# Patient Record
Sex: Male | Born: 1955 | Race: Black or African American | Hispanic: No | Marital: Single | Smoking: Current every day smoker
Health system: Southern US, Community
[De-identification: ages and names within clinical notes are randomized; demographics above are authoritative.]

## PROBLEM LIST (undated history)

## (undated) DIAGNOSIS — B20 Human immunodeficiency virus [HIV] disease: Secondary | ICD-10-CM

## (undated) DIAGNOSIS — F419 Anxiety disorder, unspecified: Secondary | ICD-10-CM

## (undated) DIAGNOSIS — F32A Depression, unspecified: Secondary | ICD-10-CM

## (undated) DIAGNOSIS — F329 Major depressive disorder, single episode, unspecified: Secondary | ICD-10-CM

## (undated) HISTORY — PX: KNEE SURGERY: SHX244

## (undated) HISTORY — PX: OTHER SURGICAL HISTORY: SHX169

---

## 2005-04-21 ENCOUNTER — Emergency Department (HOSPITAL_COMMUNITY): Admission: EM | Admit: 2005-04-21 | Discharge: 2005-04-21 | Payer: Self-pay | Admitting: Emergency Medicine

## 2005-06-30 ENCOUNTER — Emergency Department (HOSPITAL_COMMUNITY): Admission: EM | Admit: 2005-06-30 | Discharge: 2005-06-30 | Payer: Self-pay | Admitting: Emergency Medicine

## 2007-04-18 ENCOUNTER — Emergency Department (HOSPITAL_COMMUNITY): Admission: EM | Admit: 2007-04-18 | Discharge: 2007-04-18 | Payer: Self-pay | Admitting: Emergency Medicine

## 2008-05-20 ENCOUNTER — Emergency Department (HOSPITAL_COMMUNITY): Admission: EM | Admit: 2008-05-20 | Discharge: 2008-05-21 | Payer: Self-pay | Admitting: Emergency Medicine

## 2008-05-21 ENCOUNTER — Emergency Department (HOSPITAL_COMMUNITY): Admission: EM | Admit: 2008-05-21 | Discharge: 2008-05-21 | Payer: Self-pay | Admitting: Emergency Medicine

## 2010-06-17 LAB — CBC
HCT: 33 % — ABNORMAL LOW (ref 39.0–52.0)
Hemoglobin: 11.5 g/dL — ABNORMAL LOW (ref 13.0–17.0)
MCHC: 34.8 g/dL (ref 30.0–36.0)
MCV: 94.5 fL (ref 78.0–100.0)
Platelets: 251 10*3/uL (ref 150–400)
RDW: 13.4 % (ref 11.5–15.5)

## 2010-06-17 LAB — BASIC METABOLIC PANEL
BUN: 9 mg/dL (ref 6–23)
CO2: 28 mEq/L (ref 19–32)
Creatinine, Ser: 1.04 mg/dL (ref 0.4–1.5)
GFR calc non Af Amer: >60 mL/min (ref >60)
Glucose, Bld: 82 mg/dL (ref 70–99)
Sodium: 133 mEq/L — ABNORMAL LOW (ref 135–145)

## 2010-06-17 LAB — POCT I-STAT, CHEM 8
BUN: 14 mg/dL (ref 6–23)
HCT: 36 % — ABNORMAL LOW (ref 39.0–52.0)
Sodium: 142 mEq/L (ref 135–145)
TCO2: 28 mmol/L (ref 0–100)

## 2010-11-26 LAB — DIFFERENTIAL
Basophils Absolute: 0
Eosinophils Relative: 0
Lymphocytes Relative: 32
Monocytes Relative: 7
Neutrophils Relative %: 60

## 2010-11-26 LAB — URINALYSIS, ROUTINE W REFLEX MICROSCOPIC
Glucose, UA: NEGATIVE
Ketones, ur: 15 — AB
Specific Gravity, Urine: 1.029

## 2010-11-26 LAB — CBC
HCT: 34.4 — ABNORMAL LOW
MCHC: 34.2
MCV: 94
RDW: 12.8
WBC: 3.8 — ABNORMAL LOW

## 2010-11-26 LAB — I-STAT 8, (EC8 V) (CONVERTED LAB)
Chloride: 105
HCT: 39
Operator id: 198171
TCO2: 25
pCO2, Ven: 45.7

## 2010-11-26 LAB — URINE MICROSCOPIC-ADD ON

## 2010-11-26 LAB — POCT I-STAT CREATININE: Operator id: 198171

## 2011-02-02 ENCOUNTER — Emergency Department (HOSPITAL_COMMUNITY): Payer: Self-pay

## 2011-02-02 ENCOUNTER — Emergency Department (HOSPITAL_COMMUNITY)
Admission: EM | Admit: 2011-02-02 | Discharge: 2011-02-02 | Disposition: A | Discharge status: Home / Self Care | Payer: Self-pay | Attending: Emergency Medicine | Admitting: Emergency Medicine

## 2011-02-02 ENCOUNTER — Encounter: Payer: Self-pay | Admitting: *Deleted

## 2011-02-02 DIAGNOSIS — K5900 Constipation, unspecified: Secondary | ICD-10-CM | POA: Insufficient documentation

## 2011-02-02 DIAGNOSIS — R1013 Epigastric pain: Secondary | ICD-10-CM | POA: Insufficient documentation

## 2011-02-02 DIAGNOSIS — R110 Nausea: Secondary | ICD-10-CM | POA: Insufficient documentation

## 2011-02-02 DIAGNOSIS — M546 Pain in thoracic spine: Secondary | ICD-10-CM | POA: Insufficient documentation

## 2011-02-02 LAB — LIPASE, BLOOD: Lipase: 23 U/L (ref 11–59)

## 2011-02-02 LAB — DIFFERENTIAL
Basophils Relative: 1 % (ref 0–1)
Eosinophils Relative: 5 % (ref 0–5)
Lymphs Abs: 0.9 10*3/uL (ref 0.7–4.0)
Monocytes Absolute: 0.3 10*3/uL (ref 0.1–1.0)

## 2011-02-02 LAB — RAPID URINE DRUG SCREEN, HOSP PERFORMED
Amphetamines: NOT DETECTED
Barbiturates: NOT DETECTED
Benzodiazepines: NOT DETECTED

## 2011-02-02 LAB — CBC
HCT: 32.1 % — ABNORMAL LOW (ref 39.0–52.0)
MCH: 32.1 pg (ref 26.0–34.0)
MCV: 95.5 fL (ref 78.0–100.0)
RDW: 13.1 % (ref 11.5–15.5)
WBC: 4.3 10*3/uL (ref 4.0–10.5)

## 2011-02-02 LAB — URINALYSIS, ROUTINE W REFLEX MICROSCOPIC
Ketones, ur: NEGATIVE mg/dL
Leukocytes, UA: NEGATIVE
Protein, ur: NEGATIVE mg/dL
Urobilinogen, UA: 1 mg/dL (ref 0.0–1.0)
pH: 6.5 (ref 5.0–8.0)

## 2011-02-02 LAB — URINE MICROSCOPIC-ADD ON

## 2011-02-02 LAB — COMPREHENSIVE METABOLIC PANEL
ALT: 33 U/L (ref 0–53)
Alkaline Phosphatase: 71 U/L (ref 39–117)
CO2: 28 mEq/L (ref 19–32)
Creatinine, Ser: 1.03 mg/dL (ref 0.50–1.35)
GFR calc non Af Amer: 80 mL/min — ABNORMAL LOW (ref >90)
Sodium: 134 mEq/L — ABNORMAL LOW (ref 135–145)

## 2011-02-02 LAB — ETHANOL: Alcohol, Ethyl (B): <11 mg/dL (ref 0–11)

## 2011-02-02 MED ORDER — PANTOPRAZOLE SODIUM 40 MG IV SOLR
40.0000 mg | Freq: Once | INTRAVENOUS | Status: AC
  Administered 2011-02-02: 40 mg via INTRAVENOUS
  Filled 2011-02-02: qty 40

## 2011-02-02 MED ORDER — FAMOTIDINE 20 MG PO TABS
ORAL_TABLET | ORAL | Status: AC
  Administered 2011-02-02: 20 mg via ORAL
  Filled 2011-02-02: qty 1

## 2011-02-02 MED ORDER — FAMOTIDINE IN NACL 20-0.9 MG/50ML-% IV SOLN: 20.0000 mg | Freq: Once | INTRAVENOUS | Status: DC

## 2011-02-02 MED ORDER — FAMOTIDINE 20 MG PO TABS: 20.0000 mg | ORAL_TABLET | Freq: Two times a day (BID) | ORAL | Status: DC

## 2011-02-02 MED ORDER — SODIUM CHLORIDE 0.9 % IV SOLN: INTRAVENOUS | Status: DC

## 2011-02-02 MED ORDER — ONDANSETRON HCL 4 MG/2ML IJ SOLN
4.0000 mg | Freq: Once | INTRAMUSCULAR | Status: AC
  Administered 2011-02-02: 4 mg via INTRAVENOUS
  Filled 2011-02-02: qty 2

## 2011-02-02 MED ORDER — SODIUM CHLORIDE 0.9 % IV BOLUS (SEPSIS)
500.0000 mL | Freq: Once | INTRAVENOUS | Status: AC
  Administered 2011-02-02: 1000 mL via INTRAVENOUS

## 2011-02-02 MED ORDER — POLYETHYLENE GLYCOL 3350 17 GM/SCOOP PO POWD: 17.0000 g | Freq: Two times a day (BID) | ORAL | Status: AC

## 2011-02-02 MED ORDER — FAMOTIDINE 20 MG PO TABS
20.0000 mg | ORAL_TABLET | Freq: Once | ORAL | Status: AC
  Administered 2011-02-02: 20 mg via ORAL

## 2011-02-02 NOTE — ED Notes (Signed)
C/o pain in lower back and abd since Monday. Pt ambulatory, no deformity or bruising noted. Pain worse with mvt. Denies n/v/d. Pt in no acute distress, a&ox3, nuero intact.

## 2011-02-02 NOTE — ED Provider Notes (Signed)
Medical screening examination/treatment/procedure(s) were conducted as a shared visit with non-physician practitioner(s) and myself.  I personally evaluated the patient during the encounter  Flint Melter, MD 02/02/11 2052

## 2011-02-02 NOTE — ED Provider Notes (Signed)
History     CSN: [id number] Arrival date & time: 02/02/2011  6:39 AM   First MD Initiated Contact with Patient 02/02/11 [phone]      Chief Complaint  Patient presents with  . Abdominal Pain    pinpoints to L axillary /back pain, radiates into L & mid upper abd    (Consider location/radiation/quality/duration/timing/severity/associated sxs/prior treatment) Patient is a 55 y.o. male presenting with abdominal pain. The history is provided by the patient.  Abdominal Pain The primary symptoms of the illness include abdominal pain and nausea. The primary symptoms of the illness do not include fever, fatigue, shortness of breath, vomiting, diarrhea, hematemesis, hematochezia or dysuria. The current episode started more than 2 days ago. The onset of the illness was gradual.  The abdominal pain is located in the epigastric region. The abdominal pain radiates to the back. The severity of the abdominal pain is 5/10. The abdominal pain is relieved by nothing. Exacerbated by: being supine.  Associated with: no recent BM. Significant associated medical issues do not include PUD, GERD, inflammatory bowel disease or diabetes.   He has not sustained any trauma. The pain waxes and wanes. The discomfort makes it difficult to work and to sleep.  History reviewed. No pertinent past medical history.  Past Surgical History  Procedure Date  . Knee surgery     Family History  Problem Relation Age of Onset  . Diabetes Mother     History  Substance Use Topics  . Smoking status: Current Everyday Smoker  . Smokeless tobacco: Not on file  . Alcohol Use: Yes     occassional      Review of Systems  Constitutional: Negative for fever and fatigue.  Respiratory: Negative for shortness of breath.   Gastrointestinal: Positive for nausea and abdominal pain. Negative for vomiting, diarrhea, hematochezia and hematemesis.  Genitourinary: Negative for dysuria.  All other systems reviewed and are  negative.    Allergies  Review of patient's allergies indicates no known allergies.  Home Medications   Current Outpatient Rx  Name Route Sig Dispense Refill  . LORATADINE 10 MG PO TABS Oral Take 10 mg by mouth daily.        BP 118/69  Pulse 64  Temp(Src) 97.7 F (36.5 C) (Oral)  Resp 12  SpO2 100%  Physical Exam  Constitutional: He is oriented to person, place, and time. He appears well-developed and well-nourished.  HENT:  Head: Normocephalic and atraumatic.  Eyes: Conjunctivae and EOM are normal. Pupils are equal, round, and reactive to light.  Neck: Normal range of motion. Neck supple.  Cardiovascular: Normal rate.   Pulmonary/Chest: Effort normal and breath sounds normal.  Abdominal: Soft. Bowel sounds are normal. He exhibits no distension and no mass. There is tenderness (Epigastric). There is no rebound and no guarding.  Musculoskeletal: Normal range of motion.  Neurological: He is alert and oriented to person, place, and time. No cranial nerve deficit. He exhibits normal muscle tone. Coordination normal.  Skin: Skin is warm and dry.  Psychiatric: He has a normal mood and affect. His behavior is normal. Thought content normal.    ED Course  Procedures (including critical care time)  Date: 02/02/2011  Rate:55  Rhythm: normal sinus rhythm  QRS Axis: normal  Intervals: normal  ST/T Wave abnormalities: normal  Conduction Disutrbances:none  Narrative Interpretation:   Old EKG Reviewed: unchanged  Labs Reviewed  COMPREHENSIVE METABOLIC PANEL - Abnormal; Notable for the following:    Sodium 134 (*)  Total Protein 10.4 (*)    Albumin 3.0 (*)    AST 42 (*)    Total Bilirubin 0.2 (*)    GFR calc non Af Amer 80 (*)    All other components within normal limits  CBC - Abnormal; Notable for the following:    RBC 3.36 (*)    Hemoglobin 10.8 (*)    HCT 32.1 (*)    All other components within normal limits  URINALYSIS, ROUTINE W REFLEX MICROSCOPIC - Abnormal;  Notable for the following:    Hgb urine dipstick MODERATE (*)    All other components within normal limits  URINE MICROSCOPIC-ADD ON - Abnormal; Notable for the following:    Casts WBC CAST (*)    All other components within normal limits  LIPASE, BLOOD  DIFFERENTIAL  ETHANOL  URINE RAPID DRUG SCREEN (HOSP PERFORMED)   Dg Abd Acute W/chest  02/02/2011  *RADIOLOGY REPORT*  Clinical Data: Mid upper abdominal pain, back pain  ACUTE ABDOMEN SERIES (ABDOMEN 2 VIEW & CHEST 1 VIEW)  Comparison: None  Findings: Normal heart size, mediastinal contours, and pulmonary vascularity. Minimal bronchitic changes without infiltrate or effusion. No pneumothorax. Prominent stool in right colon. Nonobstructive bowel gas pattern. No bowel dilatation, bowel wall thickening or free intraperitoneal air. Osseous demineralization. No urinary tract calcification.  IMPRESSION: No acute abnormalities. Minimal bronchitic changes. Prominent stool in right colon.  Original Report Authenticated By: Lollie Marrow, M.D.     No diagnosis found.    MDM  Nonspecific abdominal pain. Doubt acute coronary syndrome, pneumonia, colitis or pancreatitis. Patient likely has GERD. He is stable for discharge.      Medical screening examination/treatment/procedure(s) were conducted as a shared visit with non-physician practitioner(s) and myself.  I personally evaluated the patient during the encounter. The patient was dispositioned from the ED via the CDU by the practitioner there.  Flint Melter, MD 02/02/11 2052

## 2011-02-02 NOTE — ED Provider Notes (Signed)
Patient seen and evaluated.  VSS reviewed. . Nursing notes reviewed. Discussed with attending physician, Dr. Effie Shy. No imaging needed at this time. NO lab abnormalities. Constipation on x-ray. Initial testing ordered. Will monitor the patient closely. They agree with the treatment plan and diagnosis.   11:34 AM abdomen soft. Minimal tenderness to palpation in epigastric region. No associated SOB or chest pain. No nausea or vomiting. Reports that he has been constipation. No rectal bleeding. Discussed warning signs for patient to return, stated agreement and understanding. No cardiac history. Discussed with Dr. Effie Shy. Patient had an EKG in the ED that was normal. No STEMI. No further cardiac workup is indicated at this time.   Nathan Charity, PA 02/02/11 1145

## 2011-02-02 NOTE — ED Notes (Signed)
Pt states that he has been having mid to lower back pain since Monday and now it has began to wrap around to his abdomen and chest.  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  States that pain has been increasing and it has become difficult to move around and lay down.

## 2011-02-02 NOTE — ED Notes (Addendum)
C/o L axillary  Back pain, radiates into mid & L upper abd, no relief with ibuprofen, tylenol nor senna. Though it might be gas. Worse with deep breath, walking  or movement, better when sitting. "has been burping a lot". (Denies: nvd or fever), last BM 2d ago, last ate yesterday, had coffee this am.

## 2011-02-04 ENCOUNTER — Emergency Department (HOSPITAL_COMMUNITY): Payer: Self-pay

## 2011-02-04 ENCOUNTER — Encounter (HOSPITAL_COMMUNITY): Payer: Self-pay | Admitting: Emergency Medicine

## 2011-02-04 ENCOUNTER — Inpatient Hospital Stay (HOSPITAL_COMMUNITY)
Admission: EM | Admit: 2011-02-04 | Discharge: 2011-02-10 | DRG: 424 | Disposition: A | Discharge status: Home / Self Care | Payer: Self-pay | Attending: Internal Medicine | Admitting: Internal Medicine

## 2011-02-04 DIAGNOSIS — Z21 Asymptomatic human immunodeficiency virus [HIV] infection status: Secondary | ICD-10-CM | POA: Diagnosis present

## 2011-02-04 DIAGNOSIS — E871 Hypo-osmolality and hyponatremia: Secondary | ICD-10-CM | POA: Diagnosis present

## 2011-02-04 DIAGNOSIS — K5900 Constipation, unspecified: Secondary | ICD-10-CM | POA: Diagnosis present

## 2011-02-04 DIAGNOSIS — K859 Acute pancreatitis without necrosis or infection, unspecified: Principal | ICD-10-CM | POA: Diagnosis present

## 2011-02-04 DIAGNOSIS — D649 Anemia, unspecified: Secondary | ICD-10-CM | POA: Diagnosis present

## 2011-02-04 DIAGNOSIS — R1013 Epigastric pain: Secondary | ICD-10-CM | POA: Diagnosis present

## 2011-02-04 DIAGNOSIS — E8809 Other disorders of plasma-protein metabolism, not elsewhere classified: Secondary | ICD-10-CM | POA: Diagnosis present

## 2011-02-04 DIAGNOSIS — R809 Proteinuria, unspecified: Secondary | ICD-10-CM | POA: Diagnosis present

## 2011-02-04 DIAGNOSIS — R591 Generalized enlarged lymph nodes: Secondary | ICD-10-CM | POA: Diagnosis present

## 2011-02-04 DIAGNOSIS — R21 Rash and other nonspecific skin eruption: Secondary | ICD-10-CM | POA: Diagnosis present

## 2011-02-04 DIAGNOSIS — B029 Zoster without complications: Secondary | ICD-10-CM | POA: Diagnosis present

## 2011-02-04 DIAGNOSIS — E049 Nontoxic goiter, unspecified: Secondary | ICD-10-CM | POA: Diagnosis present

## 2011-02-04 DIAGNOSIS — F17200 Nicotine dependence, unspecified, uncomplicated: Secondary | ICD-10-CM | POA: Diagnosis present

## 2011-02-04 DIAGNOSIS — B20 Human immunodeficiency virus [HIV] disease: Secondary | ICD-10-CM | POA: Diagnosis present

## 2011-02-04 DIAGNOSIS — R599 Enlarged lymph nodes, unspecified: Secondary | ICD-10-CM | POA: Diagnosis present

## 2011-02-04 DIAGNOSIS — E876 Hypokalemia: Secondary | ICD-10-CM | POA: Diagnosis present

## 2011-02-04 LAB — CBC
HCT: 32.6 % — ABNORMAL LOW (ref 39.0–52.0)
HCT: 31.4 % — ABNORMAL LOW (ref 39.0–52.0)
MCH: 32.5 pg (ref 26.0–34.0)
MCHC: 34.4 g/dL (ref 30.0–36.0)
MCV: 92.9 fL (ref 78.0–100.0)
MCV: 93.2 fL (ref 78.0–100.0)
Platelets: 208 10*3/uL (ref 150–400)
Platelets: 203 10*3/uL (ref 150–400)
RBC: 3.51 MIL/uL — ABNORMAL LOW (ref 4.22–5.81)
RDW: 13.3 % (ref 11.5–15.5)
WBC: 5.8 10*3/uL (ref 4.0–10.5)
WBC: 6.2 10*3/uL (ref 4.0–10.5)

## 2011-02-04 LAB — DIFFERENTIAL
Eosinophils Absolute: 0 10*3/uL (ref 0.0–0.7)
Eosinophils Relative: 0 % (ref 0–5)
Lymphocytes Relative: 14 % (ref 12–46)
Lymphs Abs: 0.8 10*3/uL (ref 0.7–4.0)
Monocytes Absolute: 0.4 10*3/uL (ref 0.1–1.0)
Monocytes Relative: 7 % (ref 3–12)

## 2011-02-04 LAB — URINALYSIS, ROUTINE W REFLEX MICROSCOPIC
Nitrite: NEGATIVE
Protein, ur: 100 mg/dL — AB
Specific Gravity, Urine: 1.019 (ref 1.005–1.030)
Urobilinogen, UA: 0.2 mg/dL (ref 0.0–1.0)

## 2011-02-04 LAB — POCT I-STAT TROPONIN I

## 2011-02-04 LAB — IRON AND TIBC
Iron: 22 ug/dL — ABNORMAL LOW (ref 42–135)
TIBC: 223 ug/dL (ref 215–435)
UIBC: 201 ug/dL (ref 125–400)

## 2011-02-04 LAB — COMPREHENSIVE METABOLIC PANEL
ALT: 99 U/L — ABNORMAL HIGH (ref 0–53)
BUN: 16 mg/dL (ref 6–23)
CO2: 24 mEq/L (ref 19–32)
Calcium: 8.8 mg/dL (ref 8.4–10.5)
Creatinine, Ser: 1.02 mg/dL (ref 0.50–1.35)
GFR calc Af Amer: >90 mL/min (ref >90)
GFR calc non Af Amer: 81 mL/min — ABNORMAL LOW (ref >90)
Glucose, Bld: 97 mg/dL (ref 70–99)
Total Protein: 11.4 g/dL — ABNORMAL HIGH (ref 6.0–8.3)

## 2011-02-04 LAB — VITAMIN B12: Vitamin B-12: 492 pg/mL (ref 211–911)

## 2011-02-04 LAB — LIPASE, BLOOD: Lipase: 83 U/L — ABNORMAL HIGH (ref 11–59)

## 2011-02-04 LAB — FERRITIN: Ferritin: 504 ng/mL — ABNORMAL HIGH (ref 22–322)

## 2011-02-04 LAB — HEPATITIS B SURFACE ANTIGEN: Hepatitis B Surface Ag: NEGATIVE

## 2011-02-04 LAB — URINE MICROSCOPIC-ADD ON

## 2011-02-04 LAB — RETICULOCYTES: Retic Count, Absolute: 33.7 10*3/uL (ref 19.0–186.0)

## 2011-02-04 LAB — SEDIMENTATION RATE: Sed Rate: 124 mm/hr — ABNORMAL HIGH (ref 0–16)

## 2011-02-04 LAB — CREATININE, SERUM: GFR calc Af Amer: >90 mL/min (ref >90)

## 2011-02-04 MED ORDER — TUBERCULIN PPD 5 UNIT/0.1ML ID SOLN
5.0000 [IU] | Freq: Once | INTRADERMAL | Status: AC
  Administered 2011-02-04: 5 [IU] via INTRADERMAL
  Filled 2011-02-04: qty 0.1

## 2011-02-04 MED ORDER — MORPHINE SULFATE 4 MG/ML IJ SOLN
4.0000 mg | Freq: Once | INTRAMUSCULAR | Status: AC
  Administered 2011-02-04: 4 mg via INTRAVENOUS
  Filled 2011-02-04: qty 1

## 2011-02-04 MED ORDER — DOCUSATE SODIUM 100 MG PO CAPS
100.0000 mg | ORAL_CAPSULE | Freq: Two times a day (BID) | ORAL | Status: DC
  Administered 2011-02-04 – 2011-02-10 (×12): 100 mg via ORAL
  Filled 2011-02-04 (×13): qty 1

## 2011-02-04 MED ORDER — HYDROXYZINE HCL 25 MG PO TABS
25.0000 mg | ORAL_TABLET | Freq: Three times a day (TID) | ORAL | Status: DC | PRN
  Filled 2011-02-04 (×3): qty 1

## 2011-02-04 MED ORDER — ALUM & MAG HYDROXIDE-SIMETH 200-200-20 MG/5ML PO SUSP
30.0000 mL | Freq: Four times a day (QID) | ORAL | Status: DC | PRN
  Administered 2011-02-06: 30 mL via ORAL
  Filled 2011-02-04 (×2): qty 30

## 2011-02-04 MED ORDER — SODIUM CHLORIDE 0.9 % IV BOLUS (SEPSIS)
1000.0000 mL | Freq: Once | INTRAVENOUS | Status: AC
  Administered 2011-02-04: 1000 mL via INTRAVENOUS

## 2011-02-04 MED ORDER — ACETAMINOPHEN 650 MG RE SUPP: 650.0000 mg | Freq: Four times a day (QID) | RECTAL | Status: DC | PRN

## 2011-02-04 MED ORDER — SODIUM CHLORIDE 0.9 % IJ SOLN
3.0000 mL | INTRAMUSCULAR | Status: DC | PRN
  Administered 2011-02-07: 3 mL via INTRAVENOUS

## 2011-02-04 MED ORDER — IOHEXOL 300 MG/ML  SOLN
100.0000 mL | Freq: Once | INTRAMUSCULAR | Status: AC | PRN
  Administered 2011-02-04: 100 mL via INTRAVENOUS

## 2011-02-04 MED ORDER — IOHEXOL 300 MG/ML  SOLN
80.0000 mL | Freq: Once | INTRAMUSCULAR | Status: AC | PRN
  Administered 2011-02-04: 80 mL via INTRAVENOUS

## 2011-02-04 MED ORDER — PROMETHAZINE HCL 25 MG/ML IJ SOLN: 12.5000 mg | Freq: Four times a day (QID) | INTRAMUSCULAR | Status: DC | PRN

## 2011-02-04 MED ORDER — SODIUM CHLORIDE 0.9 % IV SOLN
INTRAVENOUS | Status: DC
  Administered 2011-02-04 (×2): via INTRAVENOUS
  Administered 2011-02-05: 100 mL via INTRAVENOUS

## 2011-02-04 MED ORDER — SODIUM CHLORIDE 0.9 % IV SOLN: 250.0000 mL | INTRAVENOUS | Status: DC | PRN

## 2011-02-04 MED ORDER — ENOXAPARIN SODIUM 40 MG/0.4ML ~~LOC~~ SOLN
40.0000 mg | Freq: Every day | SUBCUTANEOUS | Status: DC
  Administered 2011-02-05 – 2011-02-06 (×2): 40 mg via SUBCUTANEOUS
  Filled 2011-02-04 (×3): qty 0.4

## 2011-02-04 MED ORDER — OXYCODONE HCL 5 MG PO TABS
5.0000 mg | ORAL_TABLET | ORAL | Status: DC | PRN
  Administered 2011-02-05 – 2011-02-10 (×16): 5 mg via ORAL
  Filled 2011-02-04 (×16): qty 1

## 2011-02-04 MED ORDER — ACETAMINOPHEN 325 MG PO TABS: 650.0000 mg | ORAL_TABLET | Freq: Four times a day (QID) | ORAL | Status: DC | PRN

## 2011-02-04 MED ORDER — OXYCODONE-ACETAMINOPHEN 5-325 MG PO TABS
1.0000 | ORAL_TABLET | Freq: Once | ORAL | Status: AC
  Administered 2011-02-04: 1 via ORAL
  Filled 2011-02-04: qty 1

## 2011-02-04 MED ORDER — SODIUM CHLORIDE 0.9 % IJ SOLN
3.0000 mL | Freq: Two times a day (BID) | INTRAMUSCULAR | Status: DC
  Administered 2011-02-04 – 2011-02-09 (×9): 3 mL via INTRAVENOUS

## 2011-02-04 MED ORDER — HYDROMORPHONE HCL PF 1 MG/ML IJ SOLN
0.5000 mg | INTRAMUSCULAR | Status: DC | PRN
  Administered 2011-02-04 – 2011-02-05 (×5): 0.5 mg via INTRAVENOUS
  Filled 2011-02-04 (×5): qty 1

## 2011-02-04 MED ORDER — ZOLPIDEM TARTRATE 5 MG PO TABS
5.0000 mg | ORAL_TABLET | Freq: Every evening | ORAL | Status: DC | PRN
  Administered 2011-02-05 – 2011-02-10 (×5): 5 mg via ORAL
  Filled 2011-02-04 (×5): qty 1

## 2011-02-04 MED ORDER — PROMETHAZINE HCL 25 MG PO TABS: 12.5000 mg | ORAL_TABLET | Freq: Four times a day (QID) | ORAL | Status: DC | PRN

## 2011-02-04 MED ORDER — GI COCKTAIL ~~LOC~~
30.0000 mL | Freq: Once | ORAL | Status: AC
  Administered 2011-02-04: 30 mL via ORAL
  Filled 2011-02-04: qty 30

## 2011-02-04 MED ORDER — PANTOPRAZOLE SODIUM 40 MG PO TBEC
40.0000 mg | DELAYED_RELEASE_TABLET | Freq: Every day | ORAL | Status: DC
  Administered 2011-02-04 – 2011-02-10 (×8): 40 mg via ORAL
  Filled 2011-02-04 (×8): qty 1

## 2011-02-04 NOTE — ED Provider Notes (Signed)
History     CSN: [id number] Arrival date & time: 02/04/2011  6:15 AM   First MD Initiated Contact with Patient 02/04/11 0732      Chief Complaint  Patient presents with  . Abdominal Pain    (Consider location/radiation/quality/duration/timing/severity/associated sxs/prior treatment) HPI History provided by pt and prior chart.  Pt c/o diffuse lower abd pain, worse in LLQ, w/ radiation to epigastrium x 4 days.  Associated w/ constipation; believes his most recent formed stool was 2 weeks ago.  Denies fever, N/V, urinary sx, testicular pain.  No h/o abdominal surgeries.  Per prior chart, pt seen for same on 02/02/11.  No significant lab abnormalities and acute abd series showed right-sided stool burden.  Pt d/c'd home w/ pepcid and miralax.  Has had one watery, non-bloody stool since starting miralax but no relief of pain.  Has developed a rash on face and head that he believes is a reaction to one of the two prescribed meds.  Denies lip/tongue edema and dyspnea.  Also c/o pain in left upper back.  Denies cough, CP, SOB.  No prior cardiac history, no RF for PE and denies LE pain/edema. History reviewed. No pertinent past medical history.  Past Surgical History  Procedure Date  . Knee surgery     Family History  Problem Relation Age of Onset  . Diabetes Mother     History  Substance Use Topics  . Smoking status: Current Everyday Smoker  . Smokeless tobacco: Not on file  . Alcohol Use: Yes     occassional      Review of Systems  All other systems reviewed and are negative.    Allergies  Review of patient's allergies indicates no known allergies.  Home Medications   Current Outpatient Rx  Name Route Sig Dispense Refill  . FAMOTIDINE 20 MG PO TABS Oral Take 1 tablet (20 mg total) by mouth 2 (two) times daily. 30 tablet 0  . LORATADINE 10 MG PO TABS Oral Take 10 mg by mouth daily.      Marland Kitchen POLYETHYLENE GLYCOL 3350 PO POWD Oral Take 17 g by mouth 2 (two) times daily. Place  238 gram bottle of miralax in a 64 oz gaterade. Drink the entire contents. 255 g 0    BP 107/66  Pulse 96  Temp 98.6 F (37 C)  Resp 20  SpO2 98%  Physical Exam  Nursing note and vitals reviewed. Constitutional: He is oriented to person, place, and time. He appears well-developed and well-nourished. No distress.  HENT:  Head: Normocephalic and atraumatic.  Eyes:       Normal appearance  Neck: Normal range of motion.  Cardiovascular: Normal rate and regular rhythm.   Pulmonary/Chest: Effort normal and breath sounds normal.  Abdominal: Soft. Bowel sounds are normal. He exhibits no distension and no mass. There is no rebound and no guarding.       Mild LLQ tenderness only.  No CVA tenderness  Genitourinary: Penis normal.       No rash.  No urethral discharge.  No testicular mass/tenderness.  Nml rectal tone, rectum non-tender, stool color nml and no stool impaction.  Musculoskeletal:       Entire back non-tender.  No LE edema/tenderness.   Neurological: He is alert and oriented to person, place, and time.  Skin: Skin is warm and dry. No rash noted.       Several, erythematous, slightly raised, 0.5cm lesions on forehead.  No tongue/lip edema.  Psychiatric: He has a normal  mood and affect. His behavior is normal.    ED Course  Procedures (including critical care time)   Labs Reviewed  CBC  DIFFERENTIAL  COMPREHENSIVE METABOLIC PANEL  LIPASE, BLOOD  URINALYSIS, ROUTINE W REFLEX MICROSCOPIC   Dg Abd Acute W/chest  02/02/2011  *RADIOLOGY REPORT*  Clinical Data: Mid upper abdominal pain, back pain  ACUTE ABDOMEN SERIES (ABDOMEN 2 VIEW & CHEST 1 VIEW)  Comparison: None  Findings: Normal heart size, mediastinal contours, and pulmonary vascularity. Minimal bronchitic changes without infiltrate or effusion. No pneumothorax. Prominent stool in right colon. Nonobstructive bowel gas pattern. No bowel dilatation, bowel wall thickening or free intraperitoneal air. Osseous demineralization.  No urinary tract calcification.  IMPRESSION: No acute abnormalities. Minimal bronchitic changes. Prominent stool in right colon.  Original Report Authenticated By: Lollie Marrow, M.D.     No diagnosis found.    MDM  Pt presents for the second time this week w/ lower abdominal pain.  At last visit, sx thought to be d/t constipation after nml labs and stool burden on acute abd series.  Pt has taken miralax, has not had a solid BM and pain has worsened.  On exam, tenderness isolated to LLQ.  No testicular tenderness.  No stool impaction.  Will move to CDU holding for labs and CT abd/pelvis.  Pt        Arie Sabina [place], [place] 02/05/11 [phone]

## 2011-02-04 NOTE — ED Notes (Signed)
Pt medicated for pain.

## 2011-02-04 NOTE — ED Notes (Signed)
PT. REPORTS PERSISTENT LOW ABDOMINAL PAIN X 4 DAYS , SEEN HERE 2 DAYS AGO , PRESCRIBED WITH LAXATIVE AND PEPCID WITH NO RELIEF.

## 2011-02-04 NOTE — ED Notes (Signed)
Patient tried to urinate using a urinal.  Patient unable to urinate at time.

## 2011-02-04 NOTE — ED Notes (Signed)
Report given. Waiting for facilities to check negative pressure for 5114

## 2011-02-04 NOTE — ED Notes (Signed)
Pt has started po contrast for ct

## 2011-02-04 NOTE — H&P (Addendum)
PATIENT DETAILS Name: Nathan Wood Age: 55 y.o. 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 He was initially seen on 02/02/2011 and underwent an evaluation with blood work including cardiac markers and EKG and was discharged home with the clinical diagnosis of GERD and constipation. At that time his total protein was 10.4 and his hemoglobin was 10.8. His acute abdomen series showed no acute abnormalities with only minimal bronchitic changes. The patient went home in stable conditions. Today the patient returned at around 6 AM with persistent epigastric and left upper quadrant abdominal pain. The pain is described as sharp and rated 6/10 in severity. At times it got worse. The pain radiates to his mid back and lower back. He has no vomiting or diarrhea but he has been having constipation. He took a laxative and had a bowel movement today. He denies sensory or, frequency or hematuria. He denies fever or chills or night sweats. He denies recent travel history or exposure to sick contacts. He has not been sexually active for the last 6 months. His last alcoholic beverage was in Thanksgiving when he only took 2 glasses of wine. He denies using drugs.  Today, in emergency department he received morphine and oral Percocet with partial improvement. A workup was repeated. He had repeated blood work and was found with a mild elevation in his lipase. Subsequently the patient underwent a CT scan of the abdomen with contrast as described below. The patient states that after he went home he developed a rash all over his head neck and back which he thinks is a drug allergy. It is associated to some itching.   We were contacted for admit patient  to the hospital for further evaluation and management.   ALLERGIES:  No Known Allergies  PAST MEDICAL HISTORY: Denied per patient  PAST SURGICAL HISTORY: Past Surgical History  Procedure Date  . Knee surgery     MEDICATIONS AT HOME: Prior to Admission medications   Medication Sig Start Date End Date Taking? Authorizing Provider  famotidine (PEPCID) 20 MG tablet Take 1 tablet (20 mg total) by mouth 2 (two) times daily. 02/02/11 02/02/12 Yes Jennifer A Pitylak, PA  loratadine (CLARITIN) 10 MG tablet Take 10 mg by mouth daily.     Yes Historical Provider, MD  polyethylene glycol powder (GLYCOLAX/MIRALAX) powder Take 17 g by mouth 2 (two) times daily. Place 238 gram bottle of miralax in a 64 oz gaterade. Drink the entire contents. 02/02/11 02/05/11 Yes Jennifer A Pitylak, PA    FAMILY HISTORY: Family History  Problem Relation Age of Onset  . Diabetes Mother   . Coronary artery disease Father     SOCIAL HISTORY:  reports that he has been smoking Cigarettes.  He has been smoking about .5 packs per day. He does not have any smokeless tobacco history on file. He reports that he drinks about one ounce of alcohol per week. He reports that he does not use illicit drugs.he reports being gay. Has not been sexually active for the last 5-6 months. Admits to unprotected sex in the past. He is single. Works as a Lawyer. Lives with his brother  REVIEW OF SYSTEMS:  Constitutional:   No  weight loss, night sweats,  Fevers, chills, fatigue.  HEENT:    No headaches, Difficulty swallowing,Tooth/dental problems,Sore throat,  No sneezing, itching, ear ache, nasal congestion, post nasal drip,   Cardio-vascular: Positive for pleuritic chest pain, no Orthopnea, PND, swelling in lower extremities, anasarca,         dizziness, palpitations  GI:  Positive for abdominal pain and constipation. Denies hematochezia or melena  Resp: No shortness of breath with exertion or at rest.  No excess mucus, no  productive cough, No non-productive cough,  No coughing up of blood.No change in color of mucus.No wheezing.No chest wall deformity  Skin:  Has developed a new rash over the last 2 days  GU:  no dysuria, change in color of urine, no urgency or frequency.  No flank pain.  Musculoskeletal: Complains of low back pain    PHYSICAL EXAM: Blood pressure 127/68, pulse 70, temperature 98.8 F (37.1 C), temperature source Oral, resp. rate 18, SpO2 95.00%.  General appearance :Awake, alert, not in any distress. Speech Clear. Not toxic Looking HEENT: Atraumatic and Normocephalic, pupils equally reactive to light and accomodation Neck: supple, no JVD. No cervical lymphadenopathy.  Chest:Good air entry bilaterally, no added sounds  CVS: S1 S2 regular, no murmurs.  Abdomen: Bowel sounds present, moderately tender to palpation of the epigastrium and left upper quadrant and not distended with no gaurding, rigidity or rebound. Extremities: B/L Lower Ext shows no edema, both legs are warm to touch, with  dorsalis pedis pulses palpable. Neurology: Awake alert, and oriented X 3, CN II-XII intact, Non focal, Deep Tendon Reflex-2+ all over, plantar's downgoing B/L, sensory exam is grossly intact.  Skin: Papular vesicular rash covering the head and neck and upper back posteriorly Wounds:N/A  LABS ON ADMISSION:   [facility] 02/04/11 0808 02/02/11 0826  NA 130* 134*  K 3.7 4.3  CL 96 101  CO2 24 28  GLUCOSE 97 89  BUN 16 14  CREATININE 1.02 1.03  CALCIUM 8.8 9.1  MG -- --  PHOS -- --    Basename 02/04/11 0808 02/02/11 0826  AST 112* 42*  ALT 99* 33  ALKPHOS 105 71  BILITOT 0.4 0.2*  PROT 11.4* 10.4*  ALBUMIN 2.9* 3.0*    Basename 02/04/11 0808 02/02/11 0826  LIPASE 83* 23  AMYLASE -- --    Basename 02/04/11 0808 02/02/11 0826  WBC 5.8 4.3  NEUTROABS 4.5 2.9  HGB 11.4* 10.8*  HCT 32.6* 32.1*  MCV 92.9 95.5  PLT 208 240   No results found for this basename:  CKTOTAL:3,CKMB:3,CKMBINDEX:3,TROPONINI:3 in the last 72 hours No results found for this basename: DDIMER:2 in the last 72 hours No results found for this basename: POCBNP:3 in the last 72 hours   RADIOLOGIC STUDIES ON ADMISSION: Dg Chest 2 View  02/04/2011  *RADIOLOGY REPORT*  Clinical Data: Chest pain  CHEST - 2 VIEW  Comparison: 02/02/2011  Findings: Stable bronchitic changes and interstitial prominence. Normal heart size. Reduced lung volumes.  No pneumothorax or pleural effusion.  IMPRESSION: Bronchitic changes.  Original Report Authenticated By: Donavan Burnet, M.D.   Ct Abdomen Pelvis W Contrast  02/04/2011  *RADIOLOGY REPORT*  Clinical Data: Left lower quadrant abdominal pain for a few days.  CT ABDOMEN AND PELVIS WITH CONTRAST  Technique:  Multidetector CT imaging of the abdomen and pelvis was performed following the standard protocol during bolus administration of intravenous contrast.  Contrast: OMNIPAQUE IOHEXOL 300 MG/ML IV SOLN  Comparison: acute abdominal series 02/02/2011.  Abdominal pelvic CT 05/21/2008.  Findings: Mild atelectasis is present  at both lung bases.  There is no significant pleural effusion.  There is a mildly enlarged right axillary lymph node inferiorly, measuring 1.3 x 2.3 cm on image #1. This is incompletely visualized and was not demonstrated on the prior examination.  The liver demonstrates mild periportal edema.  There is no significant biliary dilatation.  The gallbladder appears normal. No pancreatic abnormality or pancreatic ductal dilatation is demonstrated.  However, there is mild soft tissue stranding within the fat surrounding the celiac trunk and extending into the base of the mesentery.  There is no focal fluid collection.  The spleen and adrenal glands appear normal.  There are enlarging low density renal lesions bilaterally which remain most consistent with cysts.  There is no hydronephrosis.  Mild dilatation of the distal left ureter is unchanged from  the prior study.  There is no evidence of ureteral calculus or delay in contrast excretion.  No bladder abnormality is evident.  The bowel gas pattern appears normal. The appendix appears normal. No definite inflammatory changes are identified.  There is increased soft tissue in the left aortic region at the level of the kidney suspicious for adenopathy, measuring 2.9 x 1.1 cm on image 34. Portacaval ( 1.9 cm short axis, image 32) and peripancreatic (1.4 cm short axis, image 29) lymph nodes have also enlarged.  In the pelvis, there are numerous prominent external iliac, pelvic side wall and inguinal lymph nodes which are unchanged from the prior study.  There are no acute or suspicious osseous findings.  IMPRESSION:  1.  Retroperitoneal and mesenteric edema consistent with mild pancreatitis in this patient with an elevated serum lipase level. The pancreas itself demonstrates no abnormality. 2. Numerous mildly enlarged lymph nodes are demonstrated within the right axilla, mesentery, pelvis and inguinal regions.  The pelvic involvement appears stable. These are nonspecific and possibly inflammatory.  Clinical correlation and consideration of follow-up CT recommended to exclude a lymphoproliferative process.  3.  Stable mild dilatation of the distal left ureter without evidence of ureteral obstruction. 4.  Interval enlargement of small low-density renal lesions remaining most consistent with cysts.  Original Report Authenticated By: Gerrianne Scale, M.D.   Dg Abd Acute W/chest  02/02/2011  *RADIOLOGY REPORT*  Clinical Data: Mid upper abdominal pain, back pain  ACUTE ABDOMEN SERIES (ABDOMEN 2 VIEW & CHEST 1 VIEW)  Comparison: None  Findings: Normal heart size, mediastinal contours, and pulmonary vascularity. Minimal bronchitic changes without infiltrate or effusion. No pneumothorax. Prominent stool in right colon. Nonobstructive bowel gas pattern. No bowel dilatation, bowel wall thickening or free intraperitoneal  air. Osseous demineralization. No urinary tract calcification.  IMPRESSION: No acute abnormalities. Minimal bronchitic changes. Prominent stool in right colon.  Original Report Authenticated By: Lollie Marrow, M.D.    ASSESSMENT AND PLAN: Present on Admission:  .Abdominal pain, acute, epigastric. Doubt pancreatitis  .Hyperproteinemia, suspicious for plasma cell disorder/lymphoproliferative disorder  .Proteinuria .Lymphadenopathy, suspicious for HIV infection  .Anemia .Rash and nonspecific skin eruption, suspicious for chickenpox Abnormal LFTs Hyponatremia  Admit patient to MedSurg Saline lock Workup: CT scan chest with contrast, ferritin, folate RBC, HIV, iron and TIBC, LDH, PSA, SPEP and UPEP, reticulocytes, sedimentation rate, blood cultures x2 and urine culture, virus culture from the vesicles of the head, vitamin B 12, place PPD, hepatitis C virus antibody, hepatitis B antigen, serum osmolality and urine sodium Respiratory isolation Pantoprazole 40 mg a day Atarax 25 mg every 6 OxyIR for moderate pain Hydromorphone for severe pain A.m. labs CBC and BMP  and lipase  Surgical consult for lymph node biopsy      Further plan will depend as patient's clinical course evolves and further radiologic and laboratory data become available. Patient will be monitored closely.   DVT Prophylaxis: Lovenox  Code Status: Full code  Total time spent for admission equals 60 minutes.  Nathan Wood 02/04/2011, 4:07 PM

## 2011-02-04 NOTE — ED Notes (Signed)
Pt tolerating po ginger ale well.  

## 2011-02-04 NOTE — ED Notes (Signed)
I gave the patient a cup of ice and a ginger-ale. 

## 2011-02-04 NOTE — ED Notes (Signed)
Viral culture sent to lab

## 2011-02-05 ENCOUNTER — Inpatient Hospital Stay (HOSPITAL_COMMUNITY): Payer: Self-pay

## 2011-02-05 LAB — HIV ANTIBODY (ROUTINE TESTING W REFLEX): HIV: REACTIVE — AB

## 2011-02-05 MED ORDER — HYDROMORPHONE HCL PF 1 MG/ML IJ SOLN
1.0000 mg | INTRAMUSCULAR | Status: DC | PRN
  Administered 2011-02-05 (×2): 1 mg via INTRAVENOUS
  Administered 2011-02-06: 2 mg via INTRAVENOUS
  Administered 2011-02-06 (×3): 1 mg via INTRAVENOUS
  Administered 2011-02-06: 2 mg via INTRAVENOUS
  Administered 2011-02-07 – 2011-02-09 (×5): 1 mg via INTRAVENOUS
  Filled 2011-02-05 (×3): qty 1
  Filled 2011-02-05: qty 2
  Filled 2011-02-05 (×7): qty 1
  Filled 2011-02-05: qty 2

## 2011-02-05 NOTE — ED Provider Notes (Signed)
Evaluation and management procedures were performed by the PA/NP under my supervision/collaboration.   Dione Booze, MD 02/05/11 [phone]

## 2011-02-05 NOTE — Progress Notes (Signed)
Patient received from CDU.  Patient alert and oriented to person, place, time, and situation.  Abdominal pain of 5/10.  Patient oriented to room and unit.  Continent of bowel and bladder, last BM 02/04/11.  Multiple small bumps/rash to head/neck/forehead/back/upper chest/bilateral biceps.  Pt denies itching.  Dry skin noted on bilateral feet.  Will continue to monitor.

## 2011-02-05 NOTE — Progress Notes (Signed)
Subjective: History and physical exam appreciated. Patient interviewed and examined. He indicates that he was in usual state of health until approximately a week ago. Since then he has complained of worsening abdominal pain. Abdominal pain is generalized, sharp, nonradiating. Patient denies nausea vomiting or diarrhea. He denies fevers, chills, night sweats or weight loss. He noticed a rash which started on his scalp 3-4 days ago.  Objective: Blood pressure 116/66, pulse 72, temperature 98.4 F (36.9 C), temperature source Oral, resp. rate 18, height 6\' 4"  (1.93 m), weight 87.091 kg (192 lb), SpO2 98.00%. No intake or output data in the 24 hours ending 02/05/11 1619 General exam: Patient is in painful distress. Respiratory system: Clear. No increased work of breathing. Lymphatics: Bilateral submandibular, cervical and right axillary lymphadenopathy. Cardiovascular system: S1 and S2 heard, regular. No JVD or murmurs. Gastrointestinal system: Abdomen is nondistended, soft. Diffuse mild tenderness. No rigidity or guarding or rebound. Bowel sounds normally heard. Central nervous system: Alert and oriented. No focal neurological deficits.  Lab Results: Basic Metabolic Panel:  Basename 02/04/11 1615 02/04/11 0808  NA -- 130*  K -- 3.7  CL -- 96  CO2 -- 24  GLUCOSE -- 97  BUN -- 16  CREATININE 0.98 1.02  CALCIUM -- 8.8  MG -- --  PHOS -- --   Liver Function Tests:  [facility] 02/04/11 0808  AST 112*  ALT 99*  ALKPHOS 105  BILITOT 0.4  PROT 11.4*  ALBUMIN 2.9*    Basename 02/05/11 0810 02/04/11 0808  LIPASE 112* 83*  AMYLASE -- --   No results found for this basename: AMMONIA:2 in the last 72 hours CBC:  Basename 02/04/11 1615 02/04/11 0808  WBC 6.2 5.8  NEUTROABS -- 4.5  HGB 10.8* 11.4*  HCT 31.4* 32.6*  MCV 93.2 92.9  PLT 203 208   Cardiac Enzymes: No results found for this basename: CKTOTAL:3,CKMB:3,CKMBINDEX:3,TROPONINI:3 in the last 72 hours BNP: No results found  for this basename: POCBNP:3 in the last 72 hours D-Dimer: No results found for this basename: DDIMER:2 in the last 72 hours CBG: No results found for this basename: GLUCAP:6 in the last 72 hours Hemoglobin A1C: No results found for this basename: HGBA1C in the last 72 hours Fasting Lipid Panel: No results found for this basename: CHOL,HDL,LDLCALC,TRIG,CHOLHDL,LDLDIRECT in the last 72 hours Thyroid Function Tests: No results found for this basename: TSH,T4TOTAL,FREET4,T3FREE,THYROIDAB in the last 72 hours Anemia Panel:  Basename 02/04/11 1857 02/04/11 1615  VITAMINB12 492 --  FOLATE -- --  FERRITIN 504* --  TIBC 223 --  IRON 22* --  RETICCTPCT -- 1.0   Coagulation: No results found for this basename: LABPROT:2,INR:2 in the last 72 hours Urine Drug Screen: Drugs of Abuse     Component Value Date/Time   LABOPIA NONE DETECTED 02/02/2011 0934   COCAINSCRNUR NONE DETECTED 02/02/2011 0934   LABBENZ NONE DETECTED 02/02/2011 0934   AMPHETMU NONE DETECTED 02/02/2011 0934   THCU NONE DETECTED 02/02/2011 0934   LABBARB NONE DETECTED 02/02/2011 0934    Alcohol Level: No results found for this basename: ETH:2 in the last 72 hours Urinalysis:  Misc. Labs:   Micro Results: No results found for this or any previous visit (from the past 240 hour(s)).  Studies/Results: Dg Chest 2 View  02/04/2011  *RADIOLOGY REPORT*  Clinical Data: Chest pain  CHEST - 2 VIEW  Comparison: 02/02/2011  Findings: Stable bronchitic changes and interstitial prominence. Normal heart size. Reduced lung volumes.  No pneumothorax or pleural effusion.  IMPRESSION: Bronchitic  changes.  Original Report Authenticated By: Donavan Burnet, M.D.   Ct Chest W Contrast  02/04/2011  *RADIOLOGY REPORT*  Clinical Data: Left lower chest pain.  CT CHEST WITH CONTRAST  Technique:  Multidetector CT imaging of the chest was performed following the standard protocol during bolus administration of intravenous contrast.  Contrast:  80mL OMNIPAQUE IOHEXOL 300 MG/ML IV SOLN  Comparison: Chest radiograph 02/04/2011 and CT abdomen 02/04/2011  Findings: There is enlargement and heterogeneity of the left thyroid lobe.  Small lymph nodes in the supraclavicular region. There are enlarged bilateral axillary lymph nodes.  Index right axillary lymph node measures 2.8 cm on image 20.  There are also bilateral sub pectoralis lymph nodes.  No significant mediastinal or hilar lymphadenopathy.  There is periportal edema in the liver. Limited evaluation of the upper abdominal structures.  The may be a calcification involving the left main coronary artery.  The trachea and mainstem bronchi are patent.  Mild paraseptal emphysematous changes at the lung apices.  Volume loss and atelectasis at the lung bases.  Irregular nodular structure in the right lower lobe on image 47, sequence 6 measures up to 8 mm. This area is more conspicuous than on the recent abdominal CT.  There is a fuzzy 4 mm nodular density in the lingula.  No acute bony abnormality.  IMPRESSION: Extensive lymphadenopathy involving the axilla and sub pectoralis regions bilaterally.  The findings raise concern for a lymphoproliferative process or neoplasm.  There is volume loss in the lower lobes bilaterally. At least two small pulmonary nodules.  The right lower lobe nodule is more conspicuous than the recent abdominal CT and could represent an infectious or inflammatory process.  Enlargement of the left thyroid lobe.  Findings could represent a very large thyroid nodule.  This area could be further evaluated with a dedicated ultrasound.  Original Report Authenticated By: Richarda Overlie, M.D.   Ct Abdomen Pelvis W Contrast  02/04/2011  *RADIOLOGY REPORT*  Clinical Data: Left lower quadrant abdominal pain for a few days.  CT ABDOMEN AND PELVIS WITH CONTRAST  Technique:  Multidetector CT imaging of the abdomen and pelvis was performed following the standard protocol during bolus administration of  intravenous contrast.  Contrast: OMNIPAQUE IOHEXOL 300 MG/ML IV SOLN  Comparison: acute abdominal series 02/02/2011.  Abdominal pelvic CT 05/21/2008.  Findings: Mild atelectasis is present at both lung bases.  There is no significant pleural effusion.  There is a mildly enlarged right axillary lymph node inferiorly, measuring 1.3 x 2.3 cm on image #1. This is incompletely visualized and was not demonstrated on the prior examination.  The liver demonstrates mild periportal edema.  There is no significant biliary dilatation.  The gallbladder appears normal. No pancreatic abnormality or pancreatic ductal dilatation is demonstrated.  However, there is mild soft tissue stranding within the fat surrounding the celiac trunk and extending into the base of the mesentery.  There is no focal fluid collection.  The spleen and adrenal glands appear normal.  There are enlarging low density renal lesions bilaterally which remain most consistent with cysts.  There is no hydronephrosis.  Mild dilatation of the distal left ureter is unchanged from the prior study.  There is no evidence of ureteral calculus or delay in contrast excretion.  No bladder abnormality is evident.  The bowel gas pattern appears normal. The appendix appears normal. No definite inflammatory changes are identified.  There is increased soft tissue in the left aortic region at the level of the kidney  suspicious for adenopathy, measuring 2.9 x 1.1 cm on image 34. Portacaval ( 1.9 cm short axis, image 32) and peripancreatic (1.4 cm short axis, image 29) lymph nodes have also enlarged.  In the pelvis, there are numerous prominent external iliac, pelvic side wall and inguinal lymph nodes which are unchanged from the prior study.  There are no acute or suspicious osseous findings.  IMPRESSION:  1.  Retroperitoneal and mesenteric edema consistent with mild pancreatitis in this patient with an elevated serum lipase level. The pancreas itself demonstrates no  abnormality. 2. Numerous mildly enlarged lymph nodes are demonstrated within the right axilla, mesentery, pelvis and inguinal regions.  The pelvic involvement appears stable. These are nonspecific and possibly inflammatory.  Clinical correlation and consideration of follow-up CT recommended to exclude a lymphoproliferative process.  3.  Stable mild dilatation of the distal left ureter without evidence of ureteral obstruction. 4.  Interval enlargement of small low-density renal lesions remaining most consistent with cysts.  Original Report Authenticated By: Gerrianne Scale, M.D.   Dg Abd Acute W/chest  02/02/2011  *RADIOLOGY REPORT*  Clinical Data: Mid upper abdominal pain, back pain  ACUTE ABDOMEN SERIES (ABDOMEN 2 VIEW & CHEST 1 VIEW)  Comparison: None  Findings: Normal heart size, mediastinal contours, and pulmonary vascularity. Minimal bronchitic changes without infiltrate or effusion. No pneumothorax. Prominent stool in right colon. Nonobstructive bowel gas pattern. No bowel dilatation, bowel wall thickening or free intraperitoneal air. Osseous demineralization. No urinary tract calcification.  IMPRESSION: No acute abnormalities. Minimal bronchitic changes. Prominent stool in right colon.  Original Report Authenticated By: Lollie Marrow, M.D.    Medications: Scheduled Meds:   . docusate sodium  100 mg Oral BID  . enoxaparin  40 mg Subcutaneous QHS  . pantoprazole  40 mg Oral Q1200  . sodium chloride  3 mL Intravenous Q12H  . tuberculin  5 Units Intradermal Once   Continuous Infusions:   . sodium chloride 125 mL/hr at 02/05/11 1500   PRN Meds:.sodium chloride, acetaminophen, acetaminophen, alum & mag hydroxide-simeth, HYDROmorphone, hydrOXYzine, iohexol, oxyCODONE, promethazine, promethazine, sodium chloride, zolpidem  Assessment/Plan: 1. Abdominal pain, generalized lymphadenopathy, positive HIV: Differential diagnosis includes primary HIV infection versus lymphoproliferative disease  versus other etiology. Infectious disease M.D. consulted and will see tomorrow. Hold off on lymph node biopsy until seen by infectious disease. Not convinced that patient truly has acute pancreatitis. We'll check quantitative HIV and CD4 count 2. Skin rash: Question chickenpox. Await ID input. 3. Anemia: Possibly chronic disease. Follow CBCs 4. Hyponatremia: Probably from dehydration. Hydrate with normal saline and monitor.   Nathan Wood 02/05/2011, 4:19 PM

## 2011-02-06 DIAGNOSIS — K859 Acute pancreatitis without necrosis or infection, unspecified: Principal | ICD-10-CM

## 2011-02-06 LAB — COMPREHENSIVE METABOLIC PANEL
ALT: 60 U/L — ABNORMAL HIGH (ref 0–53)
AST: 59 U/L — ABNORMAL HIGH (ref 0–37)
Albumin: 2.3 g/dL — ABNORMAL LOW (ref 3.5–5.2)
Alkaline Phosphatase: 105 U/L (ref 39–117)
Glucose, Bld: 80 mg/dL (ref 70–99)
Potassium: 3.2 mEq/L — ABNORMAL LOW (ref 3.5–5.1)
Sodium: 126 mEq/L — ABNORMAL LOW (ref 135–145)
Total Protein: 9.3 g/dL — ABNORMAL HIGH (ref 6.0–8.3)

## 2011-02-06 LAB — CBC
Hemoglobin: 9.7 g/dL — ABNORMAL LOW (ref 13.0–17.0)
MCHC: 34.2 g/dL (ref 30.0–36.0)
Platelets: 181 10*3/uL (ref 150–400)
RDW: 13.2 % (ref 11.5–15.5)

## 2011-02-06 LAB — DIFFERENTIAL
Basophils Relative: 2 % — ABNORMAL HIGH (ref 0–1)
Eosinophils Relative: 1 % (ref 0–5)
Lymphs Abs: 1.9 10*3/uL (ref 0.7–4.0)
Monocytes Relative: 12 % (ref 3–12)
Neutro Abs: 1.5 10*3/uL — ABNORMAL LOW (ref 1.7–7.7)

## 2011-02-06 LAB — URINE CULTURE: Culture: NO GROWTH

## 2011-02-06 MED ORDER — PNEUMOCOCCAL VAC POLYVALENT 25 MCG/0.5ML IJ INJ
0.5000 mL | INJECTION | INTRAMUSCULAR | Status: AC
  Administered 2011-02-07: 0.5 mL via INTRAMUSCULAR
  Filled 2011-02-06: qty 0.5

## 2011-02-06 MED ORDER — DEXTROSE 5 % IV SOLN
800.0000 mg | Freq: Three times a day (TID) | INTRAVENOUS | Status: DC
  Administered 2011-02-06 – 2011-02-09 (×9): 800 mg via INTRAVENOUS
  Filled 2011-02-06 (×15): qty 16

## 2011-02-06 MED ORDER — BISACODYL 10 MG RE SUPP
10.0000 mg | Freq: Once | RECTAL | Status: AC
  Administered 2011-02-07: 10 mg via RECTAL
  Filled 2011-02-06: qty 1

## 2011-02-06 MED ORDER — SENNA 8.6 MG PO TABS
2.0000 | ORAL_TABLET | Freq: Every day | ORAL | Status: DC
  Administered 2011-02-06 – 2011-02-09 (×4): 17.2 mg via ORAL
  Filled 2011-02-06 (×7): qty 2

## 2011-02-06 MED ORDER — POTASSIUM CHLORIDE CRYS ER 20 MEQ PO TBCR
40.0000 meq | EXTENDED_RELEASE_TABLET | Freq: Once | ORAL | Status: AC
  Administered 2011-02-06: 40 meq via ORAL
  Filled 2011-02-06 (×2): qty 2

## 2011-02-06 MED ORDER — INFLUENZA VIRUS VACC SPLIT PF IM SUSP
0.5000 mL | INTRAMUSCULAR | Status: AC
  Filled 2011-02-06: qty 0.5

## 2011-02-06 MED ORDER — ONDANSETRON HCL 4 MG/2ML IJ SOLN: 4.0000 mg | Freq: Four times a day (QID) | INTRAMUSCULAR | Status: DC | PRN

## 2011-02-06 MED ORDER — MAGNESIUM HYDROXIDE 400 MG/5ML PO SUSP
30.0000 mL | Freq: Once | ORAL | Status: AC
  Administered 2011-02-07: 30 mL via ORAL
  Filled 2011-02-06: qty 30

## 2011-02-06 NOTE — Progress Notes (Signed)
Subjective: Patient says is feeling significantly better today with improvement in his abdominal pain. He is requesting some solid food. He has not had a BM in 4-5 days. He declines a nicotine patch.  Objective: Blood pressure 134/72, pulse 104, temperature 98.6 F (37 C), temperature source Oral, resp. rate 18, height 6\' 4"  (1.93 m), weight 87.091 kg (192 lb), SpO2 96.00%.  Intake/Output Summary (Last 24 hours) at 02/06/11 1303 Last data filed at 02/06/11 0630  Gross per 24 hour  Intake   2202 ml  Output   1275 ml  Net    927 ml   General exam: Patient looks significantly better compared to yesterday. He is lying comfortably supine in bed and in no obvious distress. Respiratory system: Clear. No increased work of breathing. Lymphatics: Bilateral submandibular, cervical and right axillary lymphadenopathy. Cardiovascular system: S1 and S2 heard, regular. No JVD or murmurs. Gastrointestinal system: Abdomen is nondistended, soft. Nontender. Bowel sounds normally heard. Central nervous system: Alert and oriented. No focal neurological deficits.  Lab Results: Basic Metabolic Panel:  Basename 02/06/11 0500 02/04/11 1615 02/04/11 0808  NA 126* -- 130*  K 3.2* -- 3.7  CL 97 -- 96  CO2 24 -- 24  GLUCOSE 80 -- 97  BUN 13 -- 16  CREATININE 0.87 0.98 --  CALCIUM 8.1* -- 8.8  MG -- -- --  PHOS -- -- --   Liver Function Tests:  Basename 02/06/11 0500 02/04/11 0808  AST 59* 112*  ALT 60* 99*  ALKPHOS 105 105  BILITOT 0.3 0.4  PROT 9.3* 11.4*  ALBUMIN 2.3* 2.9*    Basename 02/06/11 0500 02/05/11 0810  LIPASE 69* 112*  AMYLASE -- --   No results found for this basename: AMMONIA:2 in the last 72 hours CBC:  Basename 02/06/11 0500 02/04/11 1615 02/04/11 0808  WBC 4.0 6.2 --  NEUTROABS 1.5* -- 4.5  HGB 9.7* 10.8* --  HCT 28.4* 31.4* --  MCV 91.3 93.2 --  PLT 181 203 --   Cardiac Enzymes: No results found for this basename: CKTOTAL:3,CKMB:3,CKMBINDEX:3,TROPONINI:3 in the  last 72 hours BNP: No results found for this basename: POCBNP:3 in the last 72 hours D-Dimer: No results found for this basename: DDIMER:2 in the last 72 hours CBG: No results found for this basename: GLUCAP:6 in the last 72 hours Hemoglobin A1C: No results found for this basename: HGBA1C in the last 72 hours Fasting Lipid Panel: No results found for this basename: CHOL,HDL,LDLCALC,TRIG,CHOLHDL,LDLDIRECT in the last 72 hours Thyroid Function Tests: No results found for this basename: TSH,T4TOTAL,FREET4,T3FREE,THYROIDAB in the last 72 hours Anemia Panel:  Basename 02/04/11 1857 02/04/11 1615  VITAMINB12 492 --  FOLATE -- --  FERRITIN 504* --  TIBC 223 --  IRON 22* --  RETICCTPCT -- 1.0   Coagulation: No results found for this basename: LABPROT:2,INR:2 in the last 72 hours Urine Drug Screen: Drugs of Abuse     Component Value Date/Time   LABOPIA NONE DETECTED 02/02/2011 0934   COCAINSCRNUR NONE DETECTED 02/02/2011 0934   LABBENZ NONE DETECTED 02/02/2011 0934   AMPHETMU NONE DETECTED 02/02/2011 0934   THCU NONE DETECTED 02/02/2011 0934   LABBARB NONE DETECTED 02/02/2011 0934    Alcohol Level: No results found for this basename: ETH:2 in the last 72 hours Urinalysis:  Misc. Labs:   Micro Results: Recent Results (from the past 240 hour(s))  URINE CULTURE     Status: Normal   Collection Time   02/04/11  6:45 PM  Component Value Range Status Comment   Specimen Description URINE, CLEAN CATCH   Final    Special Requests NONE   Final    Setup Time [id number]   Final    Colony Count NO GROWTH   Final    Culture NO GROWTH   Final    Report Status 02/06/2011 FINAL   Final     Studies/Results: Dg Chest 2 View  02/04/2011  *RADIOLOGY REPORT*  Clinical Data: Chest pain  CHEST - 2 VIEW  Comparison: 02/02/2011  Findings: Stable bronchitic changes and interstitial prominence. Normal heart size. Reduced lung volumes.  No pneumothorax or pleural effusion.  IMPRESSION:  Bronchitic changes.  Original Report Authenticated By: Donavan Burnet, M.D.   Ct Chest W Contrast  02/04/2011  *RADIOLOGY REPORT*  Clinical Data: Left lower chest pain.  CT CHEST WITH CONTRAST  Technique:  Multidetector CT imaging of the chest was performed following the standard protocol during bolus administration of intravenous contrast.  Contrast: 80mL OMNIPAQUE IOHEXOL 300 MG/ML IV SOLN  Comparison: Chest radiograph 02/04/2011 and CT abdomen 02/04/2011  Findings: There is enlargement and heterogeneity of the left thyroid lobe.  Small lymph nodes in the supraclavicular region. There are enlarged bilateral axillary lymph nodes.  Index right axillary lymph node measures 2.8 cm on image 20.  There are also bilateral sub pectoralis lymph nodes.  No significant mediastinal or hilar lymphadenopathy.  There is periportal edema in the liver. Limited evaluation of the upper abdominal structures.  The may be a calcification involving the left main coronary artery.  The trachea and mainstem bronchi are patent.  Mild paraseptal emphysematous changes at the lung apices.  Volume loss and atelectasis at the lung bases.  Irregular nodular structure in the right lower lobe on image 47, sequence 6 measures up to 8 mm. This area is more conspicuous than on the recent abdominal CT.  There is a fuzzy 4 mm nodular density in the lingula.  No acute bony abnormality.  IMPRESSION: Extensive lymphadenopathy involving the axilla and sub pectoralis regions bilaterally.  The findings raise concern for a lymphoproliferative process or neoplasm.  There is volume loss in the lower lobes bilaterally. At least two small pulmonary nodules.  The right lower lobe nodule is more conspicuous than the recent abdominal CT and could represent an infectious or inflammatory process.  Enlargement of the left thyroid lobe.  Findings could represent a very large thyroid nodule.  This area could be further evaluated with a dedicated ultrasound.  Original  Report Authenticated By: Richarda Overlie, M.D.   Ct Abdomen Pelvis W Contrast  02/04/2011  *RADIOLOGY REPORT*  Clinical Data: Left lower quadrant abdominal pain for a few days.  CT ABDOMEN AND PELVIS WITH CONTRAST  Technique:  Multidetector CT imaging of the abdomen and pelvis was performed following the standard protocol during bolus administration of intravenous contrast.  Contrast: OMNIPAQUE IOHEXOL 300 MG/ML IV SOLN  Comparison: acute abdominal series 02/02/2011.  Abdominal pelvic CT 05/21/2008.  Findings: Mild atelectasis is present at both lung bases.  There is no significant pleural effusion.  There is a mildly enlarged right axillary lymph node inferiorly, measuring 1.3 x 2.3 cm on image #1. This is incompletely visualized and was not demonstrated on the prior examination.  The liver demonstrates mild periportal edema.  There is no significant biliary dilatation.  The gallbladder appears normal. No pancreatic abnormality or pancreatic ductal dilatation is demonstrated.  However, there is mild soft tissue stranding within the fat surrounding the  celiac trunk and extending into the base of the mesentery.  There is no focal fluid collection.  The spleen and adrenal glands appear normal.  There are enlarging low density renal lesions bilaterally which remain most consistent with cysts.  There is no hydronephrosis.  Mild dilatation of the distal left ureter is unchanged from the prior study.  There is no evidence of ureteral calculus or delay in contrast excretion.  No bladder abnormality is evident.  The bowel gas pattern appears normal. The appendix appears normal. No definite inflammatory changes are identified.  There is increased soft tissue in the left aortic region at the level of the kidney suspicious for adenopathy, measuring 2.9 x 1.1 cm on image 34. Portacaval ( 1.9 cm short axis, image 32) and peripancreatic (1.4 cm short axis, image 29) lymph nodes have also enlarged.  In the pelvis, there are  numerous prominent external iliac, pelvic side wall and inguinal lymph nodes which are unchanged from the prior study.  There are no acute or suspicious osseous findings.  IMPRESSION:  1.  Retroperitoneal and mesenteric edema consistent with mild pancreatitis in this patient with an elevated serum lipase level. The pancreas itself demonstrates no abnormality. 2. Numerous mildly enlarged lymph nodes are demonstrated within the right axilla, mesentery, pelvis and inguinal regions.  The pelvic involvement appears stable. These are nonspecific and possibly inflammatory.  Clinical correlation and consideration of follow-up CT recommended to exclude a lymphoproliferative process.  3.  Stable mild dilatation of the distal left ureter without evidence of ureteral obstruction. 4.  Interval enlargement of small low-density renal lesions remaining most consistent with cysts.  Original Report Authenticated By: Gerrianne Scale, M.D.   Dg Abd Acute W/chest  02/02/2011  *RADIOLOGY REPORT*  Clinical Data: Mid upper abdominal pain, back pain  ACUTE ABDOMEN SERIES (ABDOMEN 2 VIEW & CHEST 1 VIEW)  Comparison: None  Findings: Normal heart size, mediastinal contours, and pulmonary vascularity. Minimal bronchitic changes without infiltrate or effusion. No pneumothorax. Prominent stool in right colon. Nonobstructive bowel gas pattern. No bowel dilatation, bowel wall thickening or free intraperitoneal air. Osseous demineralization. No urinary tract calcification.  IMPRESSION: No acute abnormalities. Minimal bronchitic changes. Prominent stool in right colon.  Original Report Authenticated By: Lollie Marrow, M.D.    Medications: Scheduled Meds:    . docusate sodium  100 mg Oral BID  . enoxaparin  40 mg Subcutaneous QHS  . influenza  inactive virus vaccine  0.5 mL Intramuscular Tomorrow-1000  . pantoprazole  40 mg Oral Q1200  . pneumococcal 23 valent vaccine  0.5 mL Intramuscular Tomorrow-1000  . senna  2 tablet Oral QHS    . sodium chloride  3 mL Intravenous Q12H   Continuous Infusions:    . DISCONTD: sodium chloride 100 mL/hr at 02/06/11 0630   PRN Meds:.sodium chloride, acetaminophen, acetaminophen, alum & mag hydroxide-simeth, HYDROmorphone, hydrOXYzine, ondansetron, oxyCODONE, sodium chloride, zolpidem, DISCONTD: HYDROmorphone, DISCONTD: promethazine, DISCONTD: promethazine  Assessment/Plan: 1. Abdominal pain, generalized lymphadenopathy, positive HIV: Dr. Moshe Cipro input is appreciated. Await results of HIV viral load, CD4 count and genotype testing. Will consult interventional radiology for lymph node biopsy to assess for other causes of lymphadenopathy such as lymphoma or malignancy. 2. Skin rash: Question disseminated zoster. Patient has been started on intravenous acyclovir per ID. 3. Anemia: Possibly chronic disease. Follow CBCs 4. Hyponatremia: Question SIADH: Patient clinically euvolemic. Discontinue IV fluids and follow daily BMPs. 5. Abdominal pain question secondary to mild pancreatitis. Pain is resolved. Advance diet as tolerated. 6.  Thyroid mass: We'll request interventional radiology for biopsy under ultrasound guidance. 7. Tobacco abuse: Patient declines a nicotine patch 8. Hypokalemia: Replete and follow daily BMP.  Discussed patient's care with Dr. Ninetta Lights.  The patient specifically requests that his mother NOT be made aware of his HIV positive status.  Nathan Wood 02/06/2011, 1:03 PM

## 2011-02-06 NOTE — Progress Notes (Signed)
Date of Admission:  02/04/2011  Date of Consult:  02/06/2011  Reason for Consult:HIV+ Referring Physician: Dr Bennie Pierini  Impression/Recommendation Newly Dx HIV+ Thyroid Mass Lymphadenopathy Pancreatitis Rash- ? Disseminated zoster Tobacco Use  Would-1) Obtain thyroid biopsy 2) Consider LN biopsy. With his smoking hx he is a candidate for pulmonary CA (small cell) or lymphoma from his HIV.  3) Check CD4, Viral load and Genotype 4) a viral cx and PCR is sent from lesions on his abdomen to eval him for VZV.  5) Start IV acyclovir. If he continues to do well, change to PO valtrex. 6) Check RPR, chlamydia, GC, lipids 7) NPO, follow amylase, lipase, clinical exam.  8) Pneumovax, fluvax 8) Nicotene patch  Tyheem Boughner is an 55 y.o. male.  HPI: 55 year old male with a history of coming to the emergency room on 02/02/2003 with abdominal pain. He had negative plain films was given medications for constipation and was discharged home. He returned to the emergency room on November 30 with continued abdominal pain as well as a new pustular rash.He states that these are mildly pruritic, no burning. He has no pets at home, he denies recent bug bites.  He states that he did have chickenpox as a child. He noted the lesions on his scalp and forehead as well as on his back, abdomen, lower and upper extremities. In the emergency room he was found to have a rapid HIV test positive. His CT scan is remarkable for pancreatitis.  Past Medical History  Diagnosis Date  . No pertinent past medical history     Past Surgical History  Procedure Date  . Knee surgery   . Pancreatitis   ergies:   No Known Allergies  Medications: I have reviewed the patient's current medications.  Social History:  reports that he has been smoking Cigarettes.  He has a 5 pack-year smoking history. He does not have any smokeless tobacco history on file. He reports that he drinks about one ounce of alcohol per week. He reports  that he does not use illicit drugs. His MSM and without a current partner.   Family History  Problem Relation Age of Onset  . Diabetes Mother   . Coronary artery disease Father     ROS- he has had no oral ulcers or thrush. He has been constipated. He denies fevers or chills. See HPI.   Blood pressure 134/72, pulse 104, temperature 98.6 F (37 C), temperature source Oral, resp. rate 18, height 6\' 4"  (1.93 m), weight 87.091 kg (192 lb), SpO2 96.00%. Eyes- EOMI, PERRL. Mouth shows poor dentition there are no oral ulcers or thrush.  Neck is nontender I did not palpate any lymph nodes.  Chest- clear to auscultation.  CV- Regular rate and rhythm  Abdomen- bowel sounds positive soft nontender  Extremities show no edema.  Dermatologic exam shows scattered less than 1 cm pustular lesions which could be consistent with disseminated zoster. These are most notable on his head and scalp.  Results for orders placed during the hospital encounter of 02/04/11 (from the past 48 hour(s))  POCT I-STAT TROPONIN I     Status: Normal   Collection Time   02/04/11  2:53 PM      Component Value Range Comment   Troponin i, poc 0.00  0.00 - 0.08 (ng/mL)    Comment 3            CBC     Status: Abnormal   Collection Time   02/04/11  4:15 PM  Component Value Range Comment   WBC 6.2  4.0 - 10.5 (K/uL)    RBC 3.37 (*) 4.22 - 5.81 (MIL/uL)    Hemoglobin 10.8 (*) 13.0 - 17.0 (g/dL)    HCT 40.9 (*) 81.1 - 52.0 (%)    MCV 93.2  78.0 - 100.0 (fL)    MCH 32.0  26.0 - 34.0 (pg)    MCHC 34.4  30.0 - 36.0 (g/dL)    RDW 91.4  78.2 - 95.6 (%)    Platelets 203  150 - 400 (K/uL)   CREATININE, SERUM     Status: Normal   Collection Time   02/04/11  4:15 PM      Component Value Range Comment   Creatinine, Ser 0.98  0.50 - 1.35 (mg/dL)    GFR calc non Af Amer >90  >90 (mL/min)    GFR calc Af Amer >90  >90 (mL/min)   RETICULOCYTES     Status: Abnormal   Collection Time   02/04/11  4:15 PM      Component Value  Range Comment   Retic Ct Pct 1.0  0.4 - 3.1 (%)    RBC. 3.37 (*) 4.22 - 5.81 (MIL/uL)    Retic Count, Manual 33.7  19.0 - 186.0 (K/uL)   URINE CULTURE     Status: Normal   Collection Time   02/04/11  6:45 PM      Component Value Range Comment   Specimen Description URINE, CLEAN CATCH      Special Requests NONE      Setup Time [id number]      Colony Count NO GROWTH      Culture NO GROWTH      Report Status 02/06/2011 FINAL     HIV ANTIBODY (ROUTINE TESTING)     Status: Abnormal   Collection Time   02/04/11  6:57 PM      Component Value Range Comment   HIV Reactive (*) NON REACTIVE    SEDIMENTATION RATE     Status: Abnormal   Collection Time   02/04/11  6:57 PM      Component Value Range Comment   Sed Rate 124 (*) 0 - 16 (mm/hr)   LACTATE DEHYDROGENASE     Status: Abnormal   Collection Time   02/04/11  6:57 PM      Component Value Range Comment   LD 251 (*) 94 - 250 (U/L)   PSA     Status: Normal   Collection Time   02/04/11  6:57 PM      Component Value Range Comment   PSA 0.34  <=4.00 (ng/mL) Test Methodology: ECLIA PSA (Electrochemiluminescence Immunoassay)  IRON AND TIBC     Status: Abnormal   Collection Time   02/04/11  6:57 PM      Component Value Range Comment   Iron 22 (*) 42 - 135 (ug/dL)    TIBC 213  086 - 578 (ug/dL)    Saturation Ratios 10 (*) 20 - 55 (%)    UIBC 201  125 - 400 (ug/dL)   FERRITIN     Status: Abnormal   Collection Time   02/04/11  6:57 PM      Component Value Range Comment   Ferritin 504 (*) 22 - 322 (ng/mL)   VITAMIN B12     Status: Normal   Collection Time   02/04/11  6:57 PM      Component Value Range Comment   Vitamin B-12 492  211 - 911 (pg/mL)  HEPATITIS B SURFACE ANTIGEN     Status: Normal   Collection Time   02/04/11  6:57 PM      Component Value Range Comment   Hepatitis B Surface Ag NEGATIVE  NEGATIVE    HEPATITIS B SURFACE ANTIBODY     Status: Abnormal   Collection Time   02/04/11  6:57 PM      Component Value Range  Comment   Hep B S Ab POSITIVE (*) NEGATIVE    HEPATITIS C ANTIBODY     Status: Normal   Collection Time   02/04/11  6:57 PM      Component Value Range Comment   HCV Ab NEGATIVE  NEGATIVE    OSMOLALITY     Status: Abnormal   Collection Time   02/04/11  6:57 PM      Component Value Range Comment   Osmolality 271 (*) 275 - 300 (mOsm/kg)   SODIUM, URINE, RANDOM     Status: Normal   Collection Time   02/05/11  5:32 AM      Component Value Range Comment   Sodium, Ur 108     LIPASE, BLOOD     Status: Abnormal   Collection Time   02/05/11  8:10 AM      Component Value Range Comment   Lipase 112 (*) 11 - 59 (U/L)   CBC     Status: Abnormal   Collection Time   02/06/11  5:00 AM      Component Value Range Comment   WBC 4.0  4.0 - 10.5 (K/uL)    RBC 3.11 (*) 4.22 - 5.81 (MIL/uL)    Hemoglobin 9.7 (*) 13.0 - 17.0 (g/dL)    HCT 69.6 (*) 29.5 - 52.0 (%)    MCV 91.3  78.0 - 100.0 (fL)    MCH 31.2  26.0 - 34.0 (pg)    MCHC 34.2  30.0 - 36.0 (g/dL)    RDW 28.4  13.2 - 44.0 (%)    Platelets 181  150 - 400 (K/uL)   COMPREHENSIVE METABOLIC PANEL     Status: Abnormal   Collection Time   02/06/11  5:00 AM      Component Value Range Comment   Sodium 126 (*) 135 - 145 (mEq/L)    Potassium 3.2 (*) 3.5 - 5.1 (mEq/L)    Chloride 97  96 - 112 (mEq/L)    CO2 24  19 - 32 (mEq/L)    Glucose, Bld 80  70 - 99 (mg/dL)    BUN 13  6 - 23 (mg/dL)    Creatinine, Ser 1.02  0.50 - 1.35 (mg/dL)    Calcium 8.1 (*) 8.4 - 10.5 (mg/dL)    Total Protein 9.3 (*) 6.0 - 8.3 (g/dL)    Albumin 2.3 (*) 3.5 - 5.2 (g/dL)    AST 59 (*) 0 - 37 (U/L)    ALT 60 (*) 0 - 53 (U/L)    Alkaline Phosphatase 105  39 - 117 (U/L)    Total Bilirubin 0.3  0.3 - 1.2 (mg/dL)    GFR calc non Af Amer >90  >90 (mL/min)    GFR calc Af Amer >90  >90 (mL/min)   LIPASE, BLOOD     Status: Abnormal   Collection Time   02/06/11  5:00 AM      Component Value Range Comment   Lipase 69 (*) 11 - 59 (U/L)   DIFFERENTIAL     Status: Abnormal    Collection Time   02/06/11  5:00 AM      Component Value Range Comment   Neutrophils Relative 37 (*) 43 - 77 (%)    Lymphocytes Relative 48 (*) 12 - 46 (%)    Monocytes Relative 12  3 - 12 (%)    Eosinophils Relative 1  0 - 5 (%)    Basophils Relative 2 (*) 0 - 1 (%)    Neutro Abs 1.5 (*) 1.7 - 7.7 (K/uL)    Lymphs Abs 1.9  0.7 - 4.0 (K/uL)    Monocytes Absolute 0.5  0.1 - 1.0 (K/uL)    Eosinophils Absolute 0.0  0.0 - 0.7 (K/uL)    Basophils Absolute 0.1  0.0 - 0.1 (K/uL)    WBC Morphology ATYPICAL LYMPHOCYTES         Component Value Date/Time   SDES URINE, CLEAN CATCH 02/04/2011 1845   SPECREQUEST NONE 02/04/2011 1845   CULT NO GROWTH 02/04/2011 1845   REPTSTATUS 02/06/2011 FINAL 02/04/2011 1845   Ct Chest W Contrast  02/04/2011  *RADIOLOGY REPORT*  Clinical Data: Left lower chest pain.  CT CHEST WITH CONTRAST  Technique:  Multidetector CT imaging of the chest was performed following the standard protocol during bolus administration of intravenous contrast.  Contrast: 80mL OMNIPAQUE IOHEXOL 300 MG/ML IV SOLN  Comparison: Chest radiograph 02/04/2011 and CT abdomen 02/04/2011  Findings: There is enlargement and heterogeneity of the left thyroid lobe.  Small lymph nodes in the supraclavicular region. There are enlarged bilateral axillary lymph nodes.  Index right axillary lymph node measures 2.8 cm on image 20.  There are also bilateral sub pectoralis lymph nodes.  No significant mediastinal or hilar lymphadenopathy.  There is periportal edema in the liver. Limited evaluation of the upper abdominal structures.  The may be a calcification involving the left main coronary artery.  The trachea and mainstem bronchi are patent.  Mild paraseptal emphysematous changes at the lung apices.  Volume loss and atelectasis at the lung bases.  Irregular nodular structure in the right lower lobe on image 47, sequence 6 measures up to 8 mm. This area is more conspicuous than on the recent abdominal CT.  There is  a fuzzy 4 mm nodular density in the lingula.  No acute bony abnormality.  IMPRESSION: Extensive lymphadenopathy involving the axilla and sub pectoralis regions bilaterally.  The findings raise concern for a lymphoproliferative process or neoplasm.  There is volume loss in the lower lobes bilaterally. At least two small pulmonary nodules.  The right lower lobe nodule is more conspicuous than the recent abdominal CT and could represent an infectious or inflammatory process.  Enlargement of the left thyroid lobe.  Findings could represent a very large thyroid nodule.  This area could be further evaluated with a dedicated ultrasound.  Original Report Authenticated By: Richarda Overlie, M.D.   US Soft Tissue Head/neck  02/05/2011  *RADIOLOGY REPORT*  Clinical Data: Thyroid mass/nodule.  THYROID ULTRASOUND  Technique: Ultrasound examination of the thyroid gland and adjacent soft tissues was performed.  Comparison: Recent neck CT  Findings:  Right thyroid lobe:  Measures 5.2 x 2.2 x 1.6 cm. Left thyroid lobe:  Measures 5.6 x 3.7 x 3.5 cm. Isthmus:  Measures 0.9 cm.  Focal nodules:  Several predominately solid and nearly isoechoic nodules are seen, in the left greater lobe.  The largest discrete nodule in the left lobe is in the lower pole measures 2.4 x 1.6 x 2.0 cm.  Vascularity is seen within the nodule.  No calcifications are seen in  the nodule.  LEFT LOBE:  2 upper pole solid nodules are identified in the left lobe, one measuring 2.1 x 1.8 x 2.0 cm and one measuring 1.6 x 1.4 x 1.3 cm.  Mid pole thyroid nodule measures 1.4 x 1.5 x 1.4 cm.  RIGHT LOBE:  5 mm focal area of calcification is seen in the mid pole.  ISTHMUS:  Predominately solid nodule the isthmus measures 1.4 X 0.6 x 1.4 cm.  Lymphadenopathy:  None visualized.  IMPRESSION: Multiple left lobe of thyroid nodules and a single isthmic nodule. Ultrasound-guided fine needle aspiration of the largest nodule in the left lobe, in the lower lobe could be considered.  If  not performed, a 43-month follow-up thyroid ultrasound would be suggested.  Original Report Authenticated By: Britta Mccreedy, M.D.    Thank you so much for this interesting consult,   Johny Sax 02/06/2011, 11:48 AM

## 2011-02-06 NOTE — Progress Notes (Signed)
ANTIBIOTIC CONSULT NOTE - INITIAL  Pharmacy Consult for Acyclovir Indication: Disseminated zoster  No Known Allergies  Patient Measurements: Height: 6\' 4"  (193 cm) Weight: 192 lb (87.091 kg) IBW/kg (Calculated) : 86.8   Vital Signs: Temp: 98.6 F (37 C) (12/02 0620) Temp src: Oral (12/02 0620) BP: 134/72 mmHg (12/02 0620) Pulse Rate: 104  (12/02 0620) Intake/Output from previous day: 12/01 0701 - 12/02 0700 In: 2442 [P.O.:1260; I.V.:1182] Out: 1275 [Urine:1275] Intake/Output from this shift:    Labs:  [facility] 02/06/11 0500 02/04/11 1615 02/04/11 0808  WBC 4.0 6.2 5.8  HGB 9.7* 10.8* 11.4*  PLT 181 203 208  LABCREA -- -- --  CREATININE 0.87 0.98 1.02   Estimated Creatinine Clearance: 117.8 ml/min (by C-G formula based on Cr of 0.87). No results found for this basename: VANCOTROUGH:2,VANCOPEAK:2,VANCORANDOM:2,GENTTROUGH:2,GENTPEAK:2,GENTRANDOM:2,TOBRATROUGH:2,TOBRAPEAK:2,TOBRARND:2,AMIKACINPEAK:2,AMIKACINTROU:2,AMIKACIN:2, in the last 72 hours   Microbiology: Recent Results (from the past 720 hour(s))  URINE CULTURE     Status: Normal   Collection Time   02/04/11  6:45 PM      Component Value Range Status Comment   Specimen Description URINE, CLEAN CATCH   Final    Special Requests NONE   Final    Setup Time [id number]   Final    Colony Count NO GROWTH   Final    Culture NO GROWTH   Final    Report Status 02/06/2011 FINAL   Final     Medical History: Past Medical History  Diagnosis Date  . 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 Zoster PCR/HSV cx pending. Pt. Renal fx wnl   Plan:  Acyclovir 800mg  IV q8hrs Monitor renal fx  Riki Rusk 02/06/2011,2:32 PM

## 2011-02-07 ENCOUNTER — Inpatient Hospital Stay (HOSPITAL_COMMUNITY): Payer: Self-pay

## 2011-02-07 ENCOUNTER — Encounter (HOSPITAL_COMMUNITY): Payer: Self-pay | Admitting: Radiology

## 2011-02-07 DIAGNOSIS — R21 Rash and other nonspecific skin eruption: Secondary | ICD-10-CM

## 2011-02-07 DIAGNOSIS — B20 Human immunodeficiency virus [HIV] disease: Secondary | ICD-10-CM

## 2011-02-07 LAB — BASIC METABOLIC PANEL
BUN: 13 mg/dL (ref 6–23)
CO2: 25 mEq/L (ref 19–32)
Calcium: 8.4 mg/dL (ref 8.4–10.5)
Chloride: 97 mEq/L (ref 96–112)
Creatinine, Ser: 0.93 mg/dL (ref 0.50–1.35)
GFR calc Af Amer: >90 mL/min (ref >90)
GFR calc non Af Amer: >90 mL/min (ref >90)
Glucose, Bld: 104 mg/dL — ABNORMAL HIGH (ref 70–99)
Potassium: 3.3 mEq/L — ABNORMAL LOW (ref 3.5–5.1)
Sodium: 129 mEq/L — ABNORMAL LOW (ref 135–145)

## 2011-02-07 LAB — T-HELPER CELLS (CD4) COUNT (NOT AT ARMC): CD4 % Helper T Cell: 49 % (ref 33–55)

## 2011-02-07 LAB — CBC
HCT: 26.7 % — ABNORMAL LOW (ref 39.0–52.0)
Hemoglobin: 9.3 g/dL — ABNORMAL LOW (ref 13.0–17.0)
MCH: 31.5 pg (ref 26.0–34.0)
MCHC: 34.8 g/dL (ref 30.0–36.0)
MCV: 90.5 fL (ref 78.0–100.0)
Platelets: 228 10*3/uL (ref 150–400)
RBC: 2.95 MIL/uL — ABNORMAL LOW (ref 4.22–5.81)
RDW: 13.2 % (ref 11.5–15.5)
WBC: 4.9 10*3/uL (ref 4.0–10.5)

## 2011-02-07 LAB — DIFFERENTIAL
Eosinophils Relative: 3 % (ref 0–5)
Lymphocytes Relative: 39 % (ref 12–46)
Lymphs Abs: 1.9 10*3/uL (ref 0.7–4.0)
Monocytes Relative: 9 % (ref 3–12)
Neutro Abs: 2.4 10*3/uL (ref 1.7–7.7)

## 2011-02-07 LAB — GC/CHLAMYDIA PROBE AMP, GENITAL
Chlamydia, DNA Probe: NEGATIVE
GC Probe Amp, Genital: NEGATIVE

## 2011-02-07 LAB — PATHOLOGIST SMEAR REVIEW: Tech Review: NORMAL

## 2011-02-07 LAB — PROTIME-INR
INR: 1.13 (ref 0.00–1.49)
Prothrombin Time: 14.7 seconds (ref 11.6–15.2)

## 2011-02-07 MED ORDER — BISACODYL 10 MG RE SUPP
10.0000 mg | Freq: Every day | RECTAL | Status: DC | PRN
  Administered 2011-02-07 – 2011-02-08 (×2): 10 mg via RECTAL
  Filled 2011-02-07 (×2): qty 1

## 2011-02-07 MED ORDER — POTASSIUM CHLORIDE CRYS ER 20 MEQ PO TBCR
40.0000 meq | EXTENDED_RELEASE_TABLET | Freq: Once | ORAL | Status: AC
  Administered 2011-02-07: 40 meq via ORAL
  Filled 2011-02-07: qty 2

## 2011-02-07 MED ORDER — ENOXAPARIN SODIUM 40 MG/0.4ML ~~LOC~~ SOLN
40.0000 mg | SUBCUTANEOUS | Status: DC
  Administered 2011-02-08 – 2011-02-10 (×2): 40 mg via SUBCUTANEOUS
  Filled 2011-02-07 (×3): qty 0.4

## 2011-02-07 NOTE — Progress Notes (Signed)
02/07/11 1100  Clinical Encounter Type  Visited With Patient  Visit Type Spiritual support  Referral From Chaplain  Recommendations (Pt struggling with potential diagnosis. Needs emotional supp)  Spiritual Encounters  Spiritual Needs Prayer  Stress Factors  Patient Stress Factors Major life changes (Possible life-altering diagnosis after years of care for oth)  Family Stress Factors Family relationships (wants to keep diag private)    Chaplain's Note:  Visited with pt per referral from weekend chaplain staff.  Introduced self to pt and inquired whether visit would be ok.  Pt agreed.  Pt in bed, resting.  Provided pastoral presence and listening to pt story of life.  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  Pt also recently lost aunt.  Pt awaiting diagnosis of tests, and waiting is difficult- anticipating the worst, hoping for the best.  Offered prayer to pt.  Pt accepted.  Prayed while holding pt hand at bedside.  Pt shed some tears during prayer.  Will follow-up as needed or requested.  [phone])

## 2011-02-07 NOTE — Progress Notes (Signed)
02/07/2011 Lizandro Spellman [facility] Case Management Note [phone]       Utilization review completed.  

## 2011-02-07 NOTE — Procedures (Signed)
Procedure :  Left lower pole thyroid biopsy Specimens: 25 gauge x 4 complications : none immediate

## 2011-02-07 NOTE — Progress Notes (Addendum)
Subjective: Abdominal pain is controlled. Patient is tolerating regular consistency diet. Is passing flatus but still has not had a BM and abdomen feels slightly bloated. A skin rash on his scalp seem to be crusting. He wants to go out for a smoke.  Objective: Blood pressure 120/63, pulse 58, temperature 98.4 F (36.9 C), temperature source Oral, resp. rate 18, height 6\' 4"  (1.93 m), weight 87.091 kg (192 lb), SpO2 98.00%.  Intake/Output Summary (Last 24 hours) at 02/07/11 0954 Last data filed at 02/07/11 0600  Gross per 24 hour  Intake    390 ml  Output    701 ml  Net   -311 ml   General exam: Comfortable. Respiratory system: Clear. No increased work of breathing. Lymphatics: Bilateral submandibular, cervical and right axillary lymphadenopathy. Cardiovascular system: S1 and S2 heard, regular. No JVD or murmurs. Gastrointestinal system: Abdomen is nondistended, soft. Nontender. Bowel sounds normally heard. Central nervous system: Alert and oriented. No focal neurological deficits. Skin: Rash on his scalp seem to be crusting  Lab Results: Basic Metabolic Panel:  Basename 02/07/11 0515 02/06/11 0500  NA 129* 126*  K 3.3* 3.2*  CL 97 97  CO2 25 24  GLUCOSE 104* 80  BUN 13 13  CREATININE 0.93 0.87  CALCIUM 8.4 8.1*  MG -- --  PHOS -- --   Liver Function Tests:  Basename 02/06/11 0500  AST 59*  ALT 60*  ALKPHOS 105  BILITOT 0.3  PROT 9.3*  ALBUMIN 2.3*    Basename 02/06/11 0500 02/05/11 0810  LIPASE 69* 112*  AMYLASE -- --   No results found for this basename: AMMONIA:2 in the last 72 hours CBC:  Basename 02/07/11 0515 02/06/11 0500  WBC 4.9 4.0  NEUTROABS 2.4 1.5*  HGB 9.3* 9.7*  HCT 26.7* 28.4*  MCV 90.5 91.3  PLT 228 181   Cardiac Enzymes: No results found for this basename: CKTOTAL:3,CKMB:3,CKMBINDEX:3,TROPONINI:3 in the last 72 hours BNP: No results found for this basename: POCBNP:3 in the last 72 hours D-Dimer: No results found for this basename:  DDIMER:2 in the last 72 hours CBG: No results found for this basename: GLUCAP:6 in the last 72 hours Hemoglobin A1C: No results found for this basename: HGBA1C in the last 72 hours Fasting Lipid Panel: No results found for this basename: CHOL,HDL,LDLCALC,TRIG,CHOLHDL,LDLDIRECT in the last 72 hours Thyroid Function Tests: No results found for this basename: TSH,T4TOTAL,FREET4,T3FREE,THYROIDAB in the last 72 hours Anemia Panel:  Basename 02/04/11 1857 02/04/11 1615  VITAMINB12 492 --  FOLATE -- --  FERRITIN 504* --  TIBC 223 --  IRON 22* --  RETICCTPCT -- 1.0   Coagulation: No results found for this basename: LABPROT:2,INR:2 in the last 72 hours Urine Drug Screen: Drugs of Abuse     Component Value Date/Time   LABOPIA NONE DETECTED 02/02/2011 0934   COCAINSCRNUR NONE DETECTED 02/02/2011 0934   LABBENZ NONE DETECTED 02/02/2011 0934   AMPHETMU NONE DETECTED 02/02/2011 0934   THCU NONE DETECTED 02/02/2011 0934   LABBARB NONE DETECTED 02/02/2011 0934    Alcohol Level: No results found for this basename: ETH:2 in the last 72 hours Urinalysis:  Misc. Labs:   Micro Results: Recent Results (from the past 240 hour(s))  URINE CULTURE     Status: Normal   Collection Time   02/04/11  6:45 PM      Component Value Range Status Comment   Specimen Description URINE, CLEAN CATCH   Final    Special Requests NONE   Final  Setup Time [id number]   Final    Colony Count NO GROWTH   Final    Culture NO GROWTH   Final    Report Status 02/06/2011 FINAL   Final   PATHOLOGIST SMEAR REVIEW     Status: Normal   Collection Time   02/06/11  5:00 AM      Component Value Range Status Comment   Tech Review MILD ANEMIA WITH NORMAL   Final   PATHOLOGIST SMEAR REVIEW     Status: Normal   Collection Time   02/06/11  1:05 PM      Component Value Range Status Comment   Tech Review MILD ANEMIA WITH NORMAL   Final     Studies/Results: Dg Chest 2 View  02/04/2011  *RADIOLOGY REPORT*  Clinical  Data: Chest pain  CHEST - 2 VIEW  Comparison: 02/02/2011  Findings: Stable bronchitic changes and interstitial prominence. Normal heart size. Reduced lung volumes.  No pneumothorax or pleural effusion.  IMPRESSION: Bronchitic changes.  Original Report Authenticated By: Donavan Burnet, M.D.   Ct Chest W Contrast  02/04/2011  *RADIOLOGY REPORT*  Clinical Data: Left lower chest pain.  CT CHEST WITH CONTRAST  Technique:  Multidetector CT imaging of the chest was performed following the standard protocol during bolus administration of intravenous contrast.  Contrast: 80mL OMNIPAQUE IOHEXOL 300 MG/ML IV SOLN  Comparison: Chest radiograph 02/04/2011 and CT abdomen 02/04/2011  Findings: There is enlargement and heterogeneity of the left thyroid lobe.  Small lymph nodes in the supraclavicular region. There are enlarged bilateral axillary lymph nodes.  Index right axillary lymph node measures 2.8 cm on image 20.  There are also bilateral sub pectoralis lymph nodes.  No significant mediastinal or hilar lymphadenopathy.  There is periportal edema in the liver. Limited evaluation of the upper abdominal structures.  The may be a calcification involving the left main coronary artery.  The trachea and mainstem bronchi are patent.  Mild paraseptal emphysematous changes at the lung apices.  Volume loss and atelectasis at the lung bases.  Irregular nodular structure in the right lower lobe on image 47, sequence 6 measures up to 8 mm. This area is more conspicuous than on the recent abdominal CT.  There is a fuzzy 4 mm nodular density in the lingula.  No acute bony abnormality.  IMPRESSION: Extensive lymphadenopathy involving the axilla and sub pectoralis regions bilaterally.  The findings raise concern for a lymphoproliferative process or neoplasm.  There is volume loss in the lower lobes bilaterally. At least two small pulmonary nodules.  The right lower lobe nodule is more conspicuous than the recent abdominal CT and could  represent an infectious or inflammatory process.  Enlargement of the left thyroid lobe.  Findings could represent a very large thyroid nodule.  This area could be further evaluated with a dedicated ultrasound.  Original Report Authenticated By: Richarda Overlie, M.D.   Ct Abdomen Pelvis W Contrast  02/04/2011  *RADIOLOGY REPORT*  Clinical Data: Left lower quadrant abdominal pain for a few days.  CT ABDOMEN AND PELVIS WITH CONTRAST  Technique:  Multidetector CT imaging of the abdomen and pelvis was performed following the standard protocol during bolus administration of intravenous contrast.  Contrast: OMNIPAQUE IOHEXOL 300 MG/ML IV SOLN  Comparison: acute abdominal series 02/02/2011.  Abdominal pelvic CT 05/21/2008.  Findings: Mild atelectasis is present at both lung bases.  There is no significant pleural effusion.  There is a mildly enlarged right axillary lymph node inferiorly, measuring 1.3 x 2.3  cm on image #1. This is incompletely visualized and was not demonstrated on the prior examination.  The liver demonstrates mild periportal edema.  There is no significant biliary dilatation.  The gallbladder appears normal. No pancreatic abnormality or pancreatic ductal dilatation is demonstrated.  However, there is mild soft tissue stranding within the fat surrounding the celiac trunk and extending into the base of the mesentery.  There is no focal fluid collection.  The spleen and adrenal glands appear normal.  There are enlarging low density renal lesions bilaterally which remain most consistent with cysts.  There is no hydronephrosis.  Mild dilatation of the distal left ureter is unchanged from the prior study.  There is no evidence of ureteral calculus or delay in contrast excretion.  No bladder abnormality is evident.  The bowel gas pattern appears normal. The appendix appears normal. No definite inflammatory changes are identified.  There is increased soft tissue in the left aortic region at the level of the  kidney suspicious for adenopathy, measuring 2.9 x 1.1 cm on image 34. Portacaval ( 1.9 cm short axis, image 32) and peripancreatic (1.4 cm short axis, image 29) lymph nodes have also enlarged.  In the pelvis, there are numerous prominent external iliac, pelvic side wall and inguinal lymph nodes which are unchanged from the prior study.  There are no acute or suspicious osseous findings.  IMPRESSION:  1.  Retroperitoneal and mesenteric edema consistent with mild pancreatitis in this patient with an elevated serum lipase level. The pancreas itself demonstrates no abnormality. 2. Numerous mildly enlarged lymph nodes are demonstrated within the right axilla, mesentery, pelvis and inguinal regions.  The pelvic involvement appears stable. These are nonspecific and possibly inflammatory.  Clinical correlation and consideration of follow-up CT recommended to exclude a lymphoproliferative process.  3.  Stable mild dilatation of the distal left ureter without evidence of ureteral obstruction. 4.  Interval enlargement of small low-density renal lesions remaining most consistent with cysts.  Original Report Authenticated By: Gerrianne Scale, M.D.   Dg Abd Acute W/chest  02/02/2011  *RADIOLOGY REPORT*  Clinical Data: Mid upper abdominal pain, back pain  ACUTE ABDOMEN SERIES (ABDOMEN 2 VIEW & CHEST 1 VIEW)  Comparison: None  Findings: Normal heart size, mediastinal contours, and pulmonary vascularity. Minimal bronchitic changes without infiltrate or effusion. No pneumothorax. Prominent stool in right colon. Nonobstructive bowel gas pattern. No bowel dilatation, bowel wall thickening or free intraperitoneal air. Osseous demineralization. No urinary tract calcification.  IMPRESSION: No acute abnormalities. Minimal bronchitic changes. Prominent stool in right colon.  Original Report Authenticated By: Lollie Marrow, M.D.    Medications: Scheduled Meds:    . acyclovir  800 mg Intravenous Q8H  . bisacodyl  10 mg Rectal  Once  . docusate sodium  100 mg Oral BID  . enoxaparin (LOVENOX) injection  40 mg Subcutaneous Q24H  . influenza  inactive virus vaccine  0.5 mL Intramuscular Tomorrow-1000  . magnesium hydroxide  30 mL Oral Once  . pantoprazole  40 mg Oral Q1200  . pneumococcal 23 valent vaccine  0.5 mL Intramuscular Tomorrow-1000  . potassium chloride  40 mEq Oral Once  . senna  2 tablet Oral QHS  . sodium chloride  3 mL Intravenous Q12H  . DISCONTD: enoxaparin  40 mg Subcutaneous QHS   Continuous Infusions:    . DISCONTD: sodium chloride 100 mL/hr at 02/06/11 0630   PRN Meds:.sodium chloride, acetaminophen, acetaminophen, alum & mag hydroxide-simeth, HYDROmorphone, hydrOXYzine, ondansetron, oxyCODONE, sodium chloride, zolpidem, DISCONTD: promethazine,  DISCONTD: promethazine  Assessment/Plan: 1. Abdominal pain, generalized lymphadenopathy, positive HIV: Await results of HIV viral load, CD4 count and genotype testing. Patient is in the process of getting the axillary lymph node biopsy done to rule out other causes. 2. Skin rash: Question disseminated zoster. on intravenous acyclovir per ID. 3. Anemia: Possibly chronic disease. Follow CBCs 4. Hyponatremia: Question SIADH: Patient clinically euvolemic. Discontinue IV fluids and follow daily BMPs. 5. Abdominal pain question secondary to mild pancreatitis. Pain is resolved.  6. Thyroid mass: Awaiting biopsy by interventional radiology 7. Tobacco abuse: Patient declines a nicotine patch 8. Hypokalemia: Replete and follow daily BMP. 9. Constipation: Try Dulcolax suppositories or enemas when necessary.  The patient specifically requests that his mother NOT be made aware of his HIV positive status.  Faith Patricelli 02/07/2011, 9:54 AM

## 2011-02-07 NOTE — H&P (Signed)
Nathan Wood is an 55 y.o. male.   Chief Complaint: Left thyroid nodules; Axillary Lymph nodes HPI: pt scheduled for L thyroid nodule Biopsy 12/3; Scheduled for Axillary Lymph node biopsy 12/4.  Past Medical History  Diagnosis Date  . No pertinent past medical history     Past Surgical History  Procedure Date  . Knee surgery   . Pancreatitis     Family History  Problem Relation Age of Onset  . Diabetes Mother   . Coronary artery disease Father    Social History:  reports that he has been smoking Cigarettes.  He has a 5 pack-year smoking history. He does not have any smokeless tobacco history on file. He reports that he drinks about one ounce of alcohol per week. He reports that he does not use illicit drugs.  Allergies: No Known Allergies  Medications Prior to Admission  Medication Dose Route Frequency Provider Last Rate Last Dose  . 0.9 %  sodium chloride infusion  250 mL Intravenous PRN Jonny Ruiz, MD      . acetaminophen (TYLENOL) tablet 650 mg  650 mg Oral Q6H PRN Jonny Ruiz, MD       Or  . acetaminophen (TYLENOL) suppository 650 mg  650 mg Rectal Q6H PRN Jonny Ruiz, MD      . acyclovir (ZOVIRAX) 800 mg in dextrose 5 % 150 mL IVPB  800 mg Intravenous Q8H Riki Rusk, PHARMD   800 mg at 02/07/11 0800  . alum & mag hydroxide-simeth (MAALOX/MYLANTA) 200-200-20 MG/5ML suspension 30 mL  30 mL Oral Q6H PRN Jonny Ruiz, MD   30 mL at 02/06/11 1640  . bisacodyl (DULCOLAX) suppository 10 mg  10 mg Rectal Once Rolan Lipa   10 mg at 02/07/11 0024  . docusate sodium (COLACE) capsule 100 mg  100 mg Oral BID Jonny Ruiz, MD   100 mg at 02/07/11 0026  . enoxaparin (LOVENOX) injection 40 mg  40 mg Subcutaneous QHS Jonny Ruiz, MD   40 mg at 02/06/11 2223  . gi cocktail  30 mL Oral Once Otilio Miu, PA   30 mL at 02/04/11 0851  . HYDROmorphone (DILAUDID) injection 1-2 mg  1-2 mg Intravenous Q3H PRN Anand Hongalgi   2 mg at 02/06/11 2220    . hydrOXYzine (ATARAX/VISTARIL) tablet 25 mg  25 mg Oral TID PRN Jonny Ruiz, MD      . influenza  inactive virus vaccine (FLUZONE/FLUARIX) injection 0.5 mL  0.5 mL Intramuscular Tomorrow-1000 Johny Sax, MD      . iohexol (OMNIPAQUE) 300 MG/ML injection 100 mL  100 mL Intravenous Once PRN Medication Radiologist   100 mL at 02/04/11 1111  . iohexol (OMNIPAQUE) 300 MG/ML injection 80 mL  80 mL Intravenous Once PRN Medication Radiologist   80 mL at 02/04/11 1834  . magnesium hydroxide (MILK OF MAGNESIA) suspension 30 mL  30 mL Oral Once Rolan Lipa   30 mL at 02/07/11 0024  . morphine 4 MG/ML injection 4 mg  4 mg Intravenous Once Otilio Miu, PA   4 mg at 02/04/11 0851  . morphine 4 MG/ML injection 4 mg  4 mg Intravenous Once [facility]   4 mg at 02/04/11 1034  . morphine 4 MG/ML injection 4 mg  4 mg Intravenous Once [facility]   4 mg at 02/04/11 1416  . ondansetron (ZOFRAN) injection 4 mg  4 mg Intravenous Q6H PRN Anand Hongalgi      . oxyCODONE (Oxy  IR/ROXICODONE) immediate release tablet 5 mg  5 mg Oral Q4H PRN Jonny Ruiz, MD   5 mg at 02/07/11 0451  . oxyCODONE-acetaminophen (PERCOCET) 5-325 MG per tablet 1 tablet  1 tablet Oral Once Pascal Lux [facility]   1 tablet at 02/04/11 1206  . pantoprazole (PROTONIX) EC tablet 40 mg  40 mg Oral Q1200 Jonny Ruiz, MD   40 mg at 02/06/11 2217  . pneumococcal 23 valent vaccine (PNU-IMMUNE) injection 0.5 mL  0.5 mL Intramuscular Tomorrow-1000 Johny Sax, MD      . potassium chloride SA (K-DUR,KLOR-CON) CR tablet 40 mEq  40 mEq Oral Once Anand Hongalgi   40 mEq at 02/06/11 1744  . senna (SENOKOT) tablet 17.2 mg  2 tablet Oral QHS Anand Hongalgi   17.2 mg at 02/06/11 2216  . sodium chloride 0.9 % bolus 1,000 mL  1,000 mL Intravenous Once [facility]   1,000 mL at 02/04/11 1417  . sodium chloride 0.9 % injection 3 mL  3 mL Intravenous Q12H Jonny Ruiz, MD   3 mL at 02/06/11 2227  . sodium  chloride 0.9 % injection 3 mL  3 mL Intravenous PRN Jonny Ruiz, MD   3 mL at 02/07/11 0801  . tuberculin injection 5 Units  5 Units Intradermal Once Jonny Ruiz, MD   5 Units at 02/04/11 2359  . zolpidem (AMBIEN) tablet 5 mg  5 mg Oral QHS PRN Jonny Ruiz, MD   5 mg at 02/06/11 2217  . DISCONTD: 0.9 %  sodium chloride infusion   Intravenous Continuous Anand Hongalgi 100 mL/hr at 02/06/11 0630    . DISCONTD: HYDROmorphone (DILAUDID) injection 0.5 mg  0.5 mg Intravenous Q3H PRN Jonny Ruiz, MD   0.5 mg at 02/05/11 1310  . DISCONTD: promethazine (PHENERGAN) injection 12.5 mg  12.5 mg Intravenous Q6H PRN Jonny Ruiz, MD      . DISCONTD: promethazine (PHENERGAN) tablet 12.5 mg  12.5 mg Oral Q6H PRN Jonny Ruiz, MD       Medications Prior to Admission  Medication Sig Dispense Refill  . famotidine (PEPCID) 20 MG tablet Take 1 tablet (20 mg total) by mouth 2 (two) times daily.  30 tablet  0  . loratadine (CLARITIN) 10 MG tablet Take 10 mg by mouth daily.        . polyethylene glycol powder (GLYCOLAX/MIRALAX) powder Take 17 g by mouth 2 (two) times daily. Place 238 gram bottle of miralax in a 64 oz gaterade. Drink the entire contents.  255 g  0    Results for orders placed during the hospital encounter of 02/04/11 (from the past 48 hour(s))  CBC     Status: Abnormal   Collection Time   02/06/11  5:00 AM      Component Value Range Comment   WBC 4.0  4.0 - 10.5 (K/uL)    RBC 3.11 (*) 4.22 - 5.81 (MIL/uL)    Hemoglobin 9.7 (*) 13.0 - 17.0 (g/dL)    HCT 08.6 (*) 57.8 - 52.0 (%)    MCV 91.3  78.0 - 100.0 (fL)    MCH 31.2  26.0 - 34.0 (pg)    MCHC 34.2  30.0 - 36.0 (g/dL)    RDW 46.9  62.9 - 52.8 (%)    Platelets 181  150 - 400 (K/uL)   COMPREHENSIVE METABOLIC PANEL     Status: Abnormal   Collection Time   02/06/11  5:00 AM      Component Value Range Comment   Sodium 126 (*)  135 - 145 (mEq/L)    Potassium 3.2 (*) 3.5 - 5.1 (mEq/L)    Chloride 97  96 - 112 (mEq/L)    CO2 24   19 - 32 (mEq/L)    Glucose, Bld 80  70 - 99 (mg/dL)    BUN 13  6 - 23 (mg/dL)    Creatinine, Ser 1.61  0.50 - 1.35 (mg/dL)    Calcium 8.1 (*) 8.4 - 10.5 (mg/dL)    Total Protein 9.3 (*) 6.0 - 8.3 (g/dL)    Albumin 2.3 (*) 3.5 - 5.2 (g/dL)    AST 59 (*) 0 - 37 (U/L)    ALT 60 (*) 0 - 53 (U/L)    Alkaline Phosphatase 105  39 - 117 (U/L)    Total Bilirubin 0.3  0.3 - 1.2 (mg/dL)    GFR calc non Af Amer >90  >90 (mL/min)    GFR calc Af Amer >90  >90 (mL/min)   LIPASE, BLOOD     Status: Abnormal   Collection Time   02/06/11  5:00 AM      Component Value Range Comment   Lipase 69 (*) 11 - 59 (U/L)   DIFFERENTIAL     Status: Abnormal   Collection Time   02/06/11  5:00 AM      Component Value Range Comment   Neutrophils Relative 37 (*) 43 - 77 (%)    Lymphocytes Relative 48 (*) 12 - 46 (%)    Monocytes Relative 12  3 - 12 (%)    Eosinophils Relative 1  0 - 5 (%)    Basophils Relative 2 (*) 0 - 1 (%)    Neutro Abs 1.5 (*) 1.7 - 7.7 (K/uL)    Lymphs Abs 1.9  0.7 - 4.0 (K/uL)    Monocytes Absolute 0.5  0.1 - 1.0 (K/uL)    Eosinophils Absolute 0.0  0.0 - 0.7 (K/uL)    Basophils Absolute 0.1  0.0 - 0.1 (K/uL)    WBC Morphology ATYPICAL LYMPHOCYTES     RPR     Status: Normal   Collection Time   02/06/11  1:05 PM      Component Value Range Comment   RPR NON REACTIVE  NON REACTIVE    CBC     Status: Abnormal   Collection Time   02/07/11  5:15 AM      Component Value Range Comment   WBC 4.9  4.0 - 10.5 (K/uL)    RBC 2.95 (*) 4.22 - 5.81 (MIL/uL)    Hemoglobin 9.3 (*) 13.0 - 17.0 (g/dL)    HCT 09.6 (*) 04.5 - 52.0 (%)    MCV 90.5  78.0 - 100.0 (fL)    MCH 31.5  26.0 - 34.0 (pg)    MCHC 34.8  30.0 - 36.0 (g/dL)    RDW 40.9  81.1 - 91.4 (%)    Platelets 228  150 - 400 (K/uL)   BASIC METABOLIC PANEL     Status: Abnormal   Collection Time   02/07/11  5:15 AM      Component Value Range Comment   Sodium 129 (*) 135 - 145 (mEq/L)    Potassium 3.3 (*) 3.5 - 5.1 (mEq/L)    Chloride 97  96 -  112 (mEq/L)    CO2 25  19 - 32 (mEq/L)    Glucose, Bld 104 (*) 70 - 99 (mg/dL)    BUN 13  6 - 23 (mg/dL)    Creatinine, Ser 7.82  0.50 -  1.35 (mg/dL)    Calcium 8.4  8.4 - 10.5 (mg/dL)    GFR calc non Af Amer >90  >90 (mL/min)    GFR calc Af Amer >90  >90 (mL/min)   DIFFERENTIAL     Status: Normal   Collection Time   02/07/11  5:15 AM      Component Value Range Comment   Neutrophils Relative 48  43 - 77 (%)    Lymphocytes Relative 39  12 - 46 (%)    Monocytes Relative 9  3 - 12 (%)    Eosinophils Relative 3  0 - 5 (%)    Basophils Relative 1  0 - 1 (%)    Neutro Abs 2.4  1.7 - 7.7 (K/uL)    Lymphs Abs 1.9  0.7 - 4.0 (K/uL)    Monocytes Absolute 0.4  0.1 - 1.0 (K/uL)    Eosinophils Absolute 0.1  0.0 - 0.7 (K/uL)    Basophils Absolute 0.1  0.0 - 0.1 (K/uL)    RBC Morphology ROULEAUX      WBC Morphology ATYPICAL LYMPHOCYTES      US Soft Tissue Head/neck  02/05/2011  *RADIOLOGY REPORT*  Clinical Data: Thyroid mass/nodule.  THYROID ULTRASOUND  Technique: Ultrasound examination of the thyroid gland and adjacent soft tissues was performed.  Comparison: Recent neck CT  Findings:  Right thyroid lobe:  Measures 5.2 x 2.2 x 1.6 cm. Left thyroid lobe:  Measures 5.6 x 3.7 x 3.5 cm. Isthmus:  Measures 0.9 cm.  Focal nodules:  Several predominately solid and nearly isoechoic nodules are seen, in the left greater lobe.  The largest discrete nodule in the left lobe is in the lower pole measures 2.4 x 1.6 x 2.0 cm.  Vascularity is seen within the nodule.  No calcifications are seen in the nodule.  LEFT LOBE:  2 upper pole solid nodules are identified in the left lobe, one measuring 2.1 x 1.8 x 2.0 cm and one measuring 1.6 x 1.4 x 1.3 cm.  Mid pole thyroid nodule measures 1.4 x 1.5 x 1.4 cm.  RIGHT LOBE:  5 mm focal area of calcification is seen in the mid pole.  ISTHMUS:  Predominately solid nodule the isthmus measures 1.4 X 0.6 x 1.4 cm.  Lymphadenopathy:  None visualized.  IMPRESSION: Multiple left lobe of  thyroid nodules and a single isthmic nodule. Ultrasound-guided fine needle aspiration of the largest nodule in the left lobe, in the lower lobe could be considered.  If not performed, a 41-month follow-up thyroid ultrasound would be suggested.  Original Report Authenticated By: Britta Mccreedy, M.D.    ROS  Blood pressure 120/63, pulse 58, temperature 98.4 F (36.9 C), temperature source Oral, resp. rate 18, height 6\' 4"  (1.93 m), weight 192 lb (87.091 kg), SpO2 98.00%. Physical Exam   Assessment/Plan Pt scheduled for L thyroid nodule biopsy 12/3 and Axillary lymph node biopsy 12/4. Imaging reviewed by Dr Bonnielee Haff. Pt aware of procedures benefits and risks and agreeable to proceed. Consents signed and in chart.  Nathan Wood A 02/07/2011, 9:00 AM

## 2011-02-07 NOTE — Progress Notes (Signed)
Subjective: Irritated about not beign given conclusive answer re his EIA positive for HIV  Objective: Weight change:   Intake/Output Summary (Last 24 hours) at 02/07/11 1426 Last data filed at 02/07/11 0600  Gross per 24 hour  Intake    150 ml  Output    701 ml  Net   -551 ml   Blood pressure 120/63, pulse 58, temperature 98.4 F (36.9 C), temperature source Oral, resp. rate 18, height 6\' 4"  (1.93 m), weight 192 lb (87.091 kg), SpO2 98.00%. Temp:  [98.4 F (36.9 C)-98.7 F (37.1 C)] 98.4 F (36.9 C) (12/03 0600) Pulse Rate:  [58-77] 58  (12/03 0600) Resp:  [18] 18  (12/03 0600) BP: (103-120)/(57-63) 120/63 mmHg (12/03 0600) SpO2:  [98 %] 98 % (12/03 0600)  Physical Exam: General: Alert and awake, oriented x3, not in any acute distress, fairly irritated HEENT: anicteric sclera, pupils reactive to light and accommodation, EOMI CVS regular rate, normal r,  no murmur rubs or gallops Chest: clear to auscultation bilaterally, no wheezing, rales or rhonchi Abdomen: soft nontender, nondistended, normal bowel sounds, Extremities: no  clubbing or edema noted bilaterally Skin:  Diffuse rash vesciles, macules papules scalp, chest  Lab Results:  Basename 02/07/11 0515 02/06/11 0500  WBC 4.9 4.0  HGB 9.3* 9.7*  HCT 26.7* 28.4*  PLT 228 181   BMET  Basename 02/07/11 0515 02/06/11 0500  NA 129* 126*  K 3.3* 3.2*  CL 97 97  CO2 25 24  GLUCOSE 104* 80  BUN 13 13  CREATININE 0.93 0.87  CALCIUM 8.4 8.1*    Micro Results: Recent Results (from the past 240 hour(s))  URINE CULTURE     Status: Normal   Collection Time   02/04/11  6:45 PM      Component Value Range Status Comment   Specimen Description URINE, CLEAN CATCH   Final    Special Requests NONE   Final    Setup Time [id number]   Final    Colony Count NO GROWTH   Final    Culture NO GROWTH   Final    Report Status 02/06/2011 FINAL   Final   PATHOLOGIST SMEAR REVIEW     Status: Normal   Collection Time   02/06/11   5:00 AM      Component Value Range Status Comment   Tech Review MILD ANEMIA WITH NORMAL   Final   PATHOLOGIST SMEAR REVIEW     Status: Normal   Collection Time   02/06/11  1:05 PM      Component Value Range Status Comment   Tech Review MILD ANEMIA WITH NORMAL   Final     Studies/Results: Dg Chest 2 View  02/04/2011  *RADIOLOGY REPORT*  Clinical Data: Chest pain  CHEST - 2 VIEW  Comparison: 02/02/2011  Findings: Stable bronchitic changes and interstitial prominence. Normal heart size. Reduced lung volumes.  No pneumothorax or pleural effusion.  IMPRESSION: Bronchitic changes.  Original Report Authenticated By: Donavan Burnet, M.D.   Ct Chest W Contrast  02/04/2011  *RADIOLOGY REPORT*  Clinical Data: Left lower chest pain.  CT CHEST WITH CONTRAST  Technique:  Multidetector CT imaging of the chest was performed following the standard protocol during bolus administration of intravenous contrast.  Contrast: 80mL OMNIPAQUE IOHEXOL 300 MG/ML IV SOLN  Comparison: Chest radiograph 02/04/2011 and CT abdomen 02/04/2011  Findings: There is enlargement and heterogeneity of the left thyroid lobe.  Small lymph nodes in the supraclavicular region. There are enlarged bilateral axillary  lymph nodes.  Index right axillary lymph node measures 2.8 cm on image 20.  There are also bilateral sub pectoralis lymph nodes.  No significant mediastinal or hilar lymphadenopathy.  There is periportal edema in the liver. Limited evaluation of the upper abdominal structures.  The may be a calcification involving the left main coronary artery.  The trachea and mainstem bronchi are patent.  Mild paraseptal emphysematous changes at the lung apices.  Volume loss and atelectasis at the lung bases.  Irregular nodular structure in the right lower lobe on image 47, sequence 6 measures up to 8 mm. This area is more conspicuous than on the recent abdominal CT.  There is a fuzzy 4 mm nodular density in the lingula.  No acute bony abnormality.   IMPRESSION: Extensive lymphadenopathy involving the axilla and sub pectoralis regions bilaterally.  The findings raise concern for a lymphoproliferative process or neoplasm.  There is volume loss in the lower lobes bilaterally. At least two small pulmonary nodules.  The right lower lobe nodule is more conspicuous than the recent abdominal CT and could represent an infectious or inflammatory process.  Enlargement of the left thyroid lobe.  Findings could represent a very large thyroid nodule.  This area could be further evaluated with a dedicated ultrasound.  Original Report Authenticated By: Richarda Overlie, M.D.   US Soft Tissue Head/neck  02/05/2011  *RADIOLOGY REPORT*  Clinical Data: Thyroid mass/nodule.  THYROID ULTRASOUND  Technique: Ultrasound examination of the thyroid gland and adjacent soft tissues was performed.  Comparison: Recent neck CT  Findings:  Right thyroid lobe:  Measures 5.2 x 2.2 x 1.6 cm. Left thyroid lobe:  Measures 5.6 x 3.7 x 3.5 cm. Isthmus:  Measures 0.9 cm.  Focal nodules:  Several predominately solid and nearly isoechoic nodules are seen, in the left greater lobe.  The largest discrete nodule in the left lobe is in the lower pole measures 2.4 x 1.6 x 2.0 cm.  Vascularity is seen within the nodule.  No calcifications are seen in the nodule.  LEFT LOBE:  2 upper pole solid nodules are identified in the left lobe, one measuring 2.1 x 1.8 x 2.0 cm and one measuring 1.6 x 1.4 x 1.3 cm.  Mid pole thyroid nodule measures 1.4 x 1.5 x 1.4 cm.  RIGHT LOBE:  5 mm focal area of calcification is seen in the mid pole.  ISTHMUS:  Predominately solid nodule the isthmus measures 1.4 X 0.6 x 1.4 cm.  Lymphadenopathy:  None visualized.  IMPRESSION: Multiple left lobe of thyroid nodules and a single isthmic nodule. Ultrasound-guided fine needle aspiration of the largest nodule in the left lobe, in the lower lobe could be considered.  If not performed, a 46-month follow-up thyroid ultrasound would be suggested.   Original Report Authenticated By: Britta Mccreedy, M.D.   Ct Abdomen Pelvis W Contrast  02/04/2011  *RADIOLOGY REPORT*  Clinical Data: Left lower quadrant abdominal pain for a few days.  CT ABDOMEN AND PELVIS WITH CONTRAST  Technique:  Multidetector CT imaging of the abdomen and pelvis was performed following the standard protocol during bolus administration of intravenous contrast.  Contrast: OMNIPAQUE IOHEXOL 300 MG/ML IV SOLN  Comparison: acute abdominal series 02/02/2011.  Abdominal pelvic CT 05/21/2008.  Findings: Mild atelectasis is present at both lung bases.  There is no significant pleural effusion.  There is a mildly enlarged right axillary lymph node inferiorly, measuring 1.3 x 2.3 cm on image #1. This is incompletely visualized and was not demonstrated  on the prior examination.  The liver demonstrates mild periportal edema.  There is no significant biliary dilatation.  The gallbladder appears normal. No pancreatic abnormality or pancreatic ductal dilatation is demonstrated.  However, there is mild soft tissue stranding within the fat surrounding the celiac trunk and extending into the base of the mesentery.  There is no focal fluid collection.  The spleen and adrenal glands appear normal.  There are enlarging low density renal lesions bilaterally which remain most consistent with cysts.  There is no hydronephrosis.  Mild dilatation of the distal left ureter is unchanged from the prior study.  There is no evidence of ureteral calculus or delay in contrast excretion.  No bladder abnormality is evident.  The bowel gas pattern appears normal. The appendix appears normal. No definite inflammatory changes are identified.  There is increased soft tissue in the left aortic region at the level of the kidney suspicious for adenopathy, measuring 2.9 x 1.1 cm on image 34. Portacaval ( 1.9 cm short axis, image 32) and peripancreatic (1.4 cm short axis, image 29) lymph nodes have also enlarged.  In the pelvis,  there are numerous prominent external iliac, pelvic side wall and inguinal lymph nodes which are unchanged from the prior study.  There are no acute or suspicious osseous findings.  IMPRESSION:  1.  Retroperitoneal and mesenteric edema consistent with mild pancreatitis in this patient with an elevated serum lipase level. The pancreas itself demonstrates no abnormality. 2. Numerous mildly enlarged lymph nodes are demonstrated within the right axilla, mesentery, pelvis and inguinal regions.  The pelvic involvement appears stable. These are nonspecific and possibly inflammatory.  Clinical correlation and consideration of follow-up CT recommended to exclude a lymphoproliferative process.  3.  Stable mild dilatation of the distal left ureter without evidence of ureteral obstruction. 4.  Interval enlargement of small low-density renal lesions remaining most consistent with cysts.  Original Report Authenticated By: Gerrianne Scale, M.D.   Dg Abd Acute W/chest  02/02/2011  *RADIOLOGY REPORT*  Clinical Data: Mid upper abdominal pain, back pain  ACUTE ABDOMEN SERIES (ABDOMEN 2 VIEW & CHEST 1 VIEW)  Comparison: None  Findings: Normal heart size, mediastinal contours, and pulmonary vascularity. Minimal bronchitic changes without infiltrate or effusion. No pneumothorax. Prominent stool in right colon. Nonobstructive bowel gas pattern. No bowel dilatation, bowel wall thickening or free intraperitoneal air. Osseous demineralization. No urinary tract calcification.  IMPRESSION: No acute abnormalities. Minimal bronchitic changes. Prominent stool in right colon.  Original Report Authenticated By: Lollie Marrow, M.D.    Antibiotics:  Anti-infectives     Start     Dose/Rate Route Frequency Ordered Stop   02/06/11 1500   acyclovir (ZOVIRAX) 800 mg in dextrose 5 % 150 mL IVPB        800 mg 166 mL/hr over 60 Minutes Intravenous 3 times per day 02/06/11 1425            Medications: Scheduled Meds:   . acyclovir  800  mg Intravenous Q8H  . bisacodyl  10 mg Rectal Once  . docusate sodium  100 mg Oral BID  . enoxaparin (LOVENOX) injection  40 mg Subcutaneous Q24H  . influenza  inactive virus vaccine  0.5 mL Intramuscular Tomorrow-1000  . magnesium hydroxide  30 mL Oral Once  . pantoprazole  40 mg Oral Q1200  . pneumococcal 23 valent vaccine  0.5 mL Intramuscular Tomorrow-1000  . potassium chloride  40 mEq Oral Once  . potassium chloride  40 mEq Oral Once  .  senna  2 tablet Oral QHS  . sodium chloride  3 mL Intravenous Q12H  . DISCONTD: enoxaparin  40 mg Subcutaneous QHS   Continuous Infusions:  PRN Meds:.sodium chloride, acetaminophen, acetaminophen, alum & mag hydroxide-simeth, bisacodyl, HYDROmorphone, hydrOXYzine, ondansetron, oxyCODONE, sodium chloride, zolpidem  Assessment/Plan: Nathan Wood is a 55 y.o. male with  Newly Dx HIV (EIA positive) admitted with pancreatitis, found to have diffuse rash, lymphadenopathy and thyroid nodule   Rash: -- in case this is DIss zoster continue acyclovir. PCR and cultures pending --rpr  HIV: --need check viral load and getype. conf WB not back yet --He will need plug into ADAP. He would also be START trial candidate  Pancreatitis: --not clear cause  LYmphadenopathy": --could be due to his HIV itself. Certainly maliganncy remains on differential as well   LOS: 3 days   Acey Lav 02/07/2011, 2:26 PM

## 2011-02-08 ENCOUNTER — Other Ambulatory Visit: Payer: Self-pay | Admitting: Interventional Radiology

## 2011-02-08 ENCOUNTER — Inpatient Hospital Stay (HOSPITAL_COMMUNITY): Payer: Self-pay

## 2011-02-08 LAB — COMPREHENSIVE METABOLIC PANEL
ALT: 45 U/L (ref 0–53)
CO2: 25 mEq/L (ref 19–32)
Calcium: 8.5 mg/dL (ref 8.4–10.5)
Creatinine, Ser: 0.96 mg/dL (ref 0.50–1.35)
GFR calc Af Amer: >90 mL/min (ref >90)
GFR calc non Af Amer: >90 mL/min (ref >90)
Glucose, Bld: 96 mg/dL (ref 70–99)
Sodium: 133 mEq/L — ABNORMAL LOW (ref 135–145)
Total Protein: 9.7 g/dL — ABNORMAL HIGH (ref 6.0–8.3)

## 2011-02-08 LAB — PROTEIN ELECTROPHORESIS, SERUM
Alpha-1-Globulin: 4.5 % (ref 2.9–4.9)
Beta 2: 6.7 % — ABNORMAL HIGH (ref 3.2–6.5)
Gamma Globulin: 44.8 % — ABNORMAL HIGH (ref 11.1–18.8)

## 2011-02-08 LAB — LIPID PANEL
HDL: 6 mg/dL — ABNORMAL LOW (ref >39)
Total CHOL/HDL Ratio: 16.3 RATIO
Triglycerides: 178 mg/dL — ABNORMAL HIGH (ref <150)

## 2011-02-08 LAB — CBC
Hemoglobin: 9.3 g/dL — ABNORMAL LOW (ref 13.0–17.0)
MCH: 31 pg (ref 26.0–34.0)
MCHC: 34.3 g/dL (ref 30.0–36.0)
MCV: 90.3 fL (ref 78.0–100.0)
Platelets: 265 10*3/uL (ref 150–400)

## 2011-02-08 LAB — HIV 1/2 CONFIRMATION: HIV-2 Ab: NEGATIVE

## 2011-02-08 MED ORDER — MIDAZOLAM HCL 2 MG/2ML IJ SOLN
INTRAMUSCULAR | Status: AC
  Filled 2011-02-08: qty 4

## 2011-02-08 MED ORDER — SODIUM CHLORIDE 0.9 % IV SOLN
INTRAVENOUS | Status: AC | PRN
  Administered 2011-02-08: 50 mL/h via INTRAVENOUS

## 2011-02-08 MED ORDER — MIDAZOLAM HCL 5 MG/5ML IJ SOLN
INTRAMUSCULAR | Status: AC | PRN
  Administered 2011-02-08 (×2): 1 mg via INTRAVENOUS

## 2011-02-08 MED ORDER — FENTANYL CITRATE 0.05 MG/ML IJ SOLN
INTRAMUSCULAR | Status: AC | PRN
  Administered 2011-02-08: 50 ug via INTRAVENOUS

## 2011-02-08 MED ORDER — FENTANYL CITRATE 0.05 MG/ML IJ SOLN
INTRAMUSCULAR | Status: AC
  Filled 2011-02-08: qty 4

## 2011-02-08 NOTE — Procedures (Signed)
R AX LN Bx 18 g core x 8 No comp

## 2011-02-08 NOTE — Progress Notes (Signed)
Subjective: Had in 2 good BMs. Minimal intermittent abdominal pain. Tolerating diet. No nausea vomiting. Frustrated that he still has to be in the hospital and on isolation. Eager to go home.  Objective: Blood pressure 123/76, pulse 71, temperature 98.2 F (36.8 C), temperature source Axillary, resp. rate 20, height 6\' 4"  (1.93 m), weight 87.091 kg (192 lb), SpO2 100.00%.  Intake/Output Summary (Last 24 hours) at 02/08/11 1627 Last data filed at 02/08/11 1300  Gross per 24 hour  Intake      3 ml  Output      0 ml  Net      3 ml   General exam: Comfortable. Sitting up in bed. Currently n.p.o. for axillary lymph node biopsy. No neck pain Respiratory system: Clear. No increased work of breathing. Lymphatics: Bilateral submandibular, cervical and right axillary lymphadenopathy. Cardiovascular system: S1 and S2 heard, regular. No JVD or murmurs. Gastrointestinal system: Abdomen is nondistended, soft. Nontender. Bowel sounds normally heard. Central nervous system: Alert and oriented. No focal neurological deficits. Skin: Rash on his scalp seem to be crusting  Lab Results: Basic Metabolic Panel:  Basename 02/08/11 0520 02/07/11 0515  NA 133* 129*  K 3.8 3.3*  CL 101 97  CO2 25 25  GLUCOSE 96 104*  BUN 11 13  CREATININE 0.96 0.93  CALCIUM 8.5 8.4  MG -- 2.0  PHOS -- --   Liver Function Tests:  Basename 02/08/11 0520 02/06/11 0500  AST 39* 59*  ALT 45 60*  ALKPHOS 130* 105  BILITOT 0.3 0.3  PROT 9.7* 9.3*  ALBUMIN 2.4* 2.3*    Basename 02/06/11 0500  LIPASE 69*  AMYLASE --   No results found for this basename: AMMONIA:2 in the last 72 hours CBC:  Basename 02/08/11 0520 02/07/11 0515 02/06/11 0500  WBC 5.4 4.9 --  NEUTROABS -- 2.4 1.5*  HGB 9.3* 9.3* --  HCT 27.1* 26.7* --  MCV 90.3 90.5 --  PLT 265 228 --   Cardiac Enzymes: No results found for this basename: CKTOTAL:3,CKMB:3,CKMBINDEX:3,TROPONINI:3 in the last 72 hours BNP: No results found for this basename:  POCBNP:3 in the last 72 hours D-Dimer: No results found for this basename: DDIMER:2 in the last 72 hours CBG: No results found for this basename: GLUCAP:6 in the last 72 hours Hemoglobin A1C: No results found for this basename: HGBA1C in the last 72 hours Fasting Lipid Panel:  Basename 02/08/11 0520  CHOL 98  HDL 6*  LDLCALC 56  TRIG 178*  CHOLHDL 16.3  LDLDIRECT --   Thyroid Function Tests: No results found for this basename: TSH,T4TOTAL,FREET4,T3FREE,THYROIDAB in the last 72 hours Anemia Panel: No results found for this basename: VITAMINB12,FOLATE,FERRITIN,TIBC,IRON,RETICCTPCT in the last 72 hours Coagulation:  Basename 02/07/11 0939  LABPROT 14.7  INR 1.13   Urine Drug Screen: Drugs of Abuse     Component Value Date/Time   LABOPIA NONE DETECTED 02/02/2011 0934   COCAINSCRNUR NONE DETECTED 02/02/2011 0934   LABBENZ NONE DETECTED 02/02/2011 0934   AMPHETMU NONE DETECTED 02/02/2011 0934   THCU NONE DETECTED 02/02/2011 0934   LABBARB NONE DETECTED 02/02/2011 0934    Alcohol Level: No results found for this basename: ETH:2 in the last 72 hours Urinalysis:  Misc. Labs:   Micro Results: Recent Results (from the past 240 hour(s))  VIRAL CULTURE VIRC     Status: Normal (Preliminary result)   Collection Time   02/04/11  6:20 PM      Component Value Range Status Comment   Specimen Description HEAD  Final    Special Requests NONE   Final    Culture Culture has been initiated.   Final    Report Status PENDING   Incomplete   URINE CULTURE     Status: Normal   Collection Time   02/04/11  6:45 PM      Component Value Range Status Comment   Specimen Description URINE, CLEAN CATCH   Final    Special Requests NONE   Final    Setup Time [id number]   Final    Colony Count NO GROWTH   Final    Culture NO GROWTH   Final    Report Status 02/06/2011 FINAL   Final   PATHOLOGIST SMEAR REVIEW     Status: Normal   Collection Time   02/06/11  5:00 AM      Component Value  Range Status Comment   Tech Review MILD ANEMIA WITH NORMAL   Final   VIRAL CULTURE VIRC     Status: Normal (Preliminary result)   Collection Time   02/06/11 12:10 PM      Component Value Range Status Comment   Specimen Description OTHER SIDE   Final    Special Requests NONE   Final    Culture Culture has been initiated.   Final    Report Status PENDING   Incomplete   PATHOLOGIST SMEAR REVIEW     Status: Normal   Collection Time   02/06/11  1:05 PM      Component Value Range Status Comment   Tech Review MILD ANEMIA WITH NORMAL   Final     Studies/Results: Dg Chest 2 View  02/04/2011  *RADIOLOGY REPORT*  Clinical Data: Chest pain  CHEST - 2 VIEW  Comparison: 02/02/2011  Findings: Stable bronchitic changes and interstitial prominence. Normal heart size. Reduced lung volumes.  No pneumothorax or pleural effusion.  IMPRESSION: Bronchitic changes.  Original Report Authenticated By: Donavan Burnet, M.D.   Ct Chest W Contrast  02/04/2011  *RADIOLOGY REPORT*  Clinical Data: Left lower chest pain.  CT CHEST WITH CONTRAST  Technique:  Multidetector CT imaging of the chest was performed following the standard protocol during bolus administration of intravenous contrast.  Contrast: 80mL OMNIPAQUE IOHEXOL 300 MG/ML IV SOLN  Comparison: Chest radiograph 02/04/2011 and CT abdomen 02/04/2011  Findings: There is enlargement and heterogeneity of the left thyroid lobe.  Small lymph nodes in the supraclavicular region. There are enlarged bilateral axillary lymph nodes.  Index right axillary lymph node measures 2.8 cm on image 20.  There are also bilateral sub pectoralis lymph nodes.  No significant mediastinal or hilar lymphadenopathy.  There is periportal edema in the liver. Limited evaluation of the upper abdominal structures.  The may be a calcification involving the left main coronary artery.  The trachea and mainstem bronchi are patent.  Mild paraseptal emphysematous changes at the lung apices.  Volume loss  and atelectasis at the lung bases.  Irregular nodular structure in the right lower lobe on image 47, sequence 6 measures up to 8 mm. This area is more conspicuous than on the recent abdominal CT.  There is a fuzzy 4 mm nodular density in the lingula.  No acute bony abnormality.  IMPRESSION: Extensive lymphadenopathy involving the axilla and sub pectoralis regions bilaterally.  The findings raise concern for a lymphoproliferative process or neoplasm.  There is volume loss in the lower lobes bilaterally. At least two small pulmonary nodules.  The right lower lobe nodule is more conspicuous than the  recent abdominal CT and could represent an infectious or inflammatory process.  Enlargement of the left thyroid lobe.  Findings could represent a very large thyroid nodule.  This area could be further evaluated with a dedicated ultrasound.  Original Report Authenticated By: Richarda Overlie, M.D.   Ct Abdomen Pelvis W Contrast  02/04/2011  *RADIOLOGY REPORT*  Clinical Data: Left lower quadrant abdominal pain for a few days.  CT ABDOMEN AND PELVIS WITH CONTRAST  Technique:  Multidetector CT imaging of the abdomen and pelvis was performed following the standard protocol during bolus administration of intravenous contrast.  Contrast: OMNIPAQUE IOHEXOL 300 MG/ML IV SOLN  Comparison: acute abdominal series 02/02/2011.  Abdominal pelvic CT 05/21/2008.  Findings: Mild atelectasis is present at both lung bases.  There is no significant pleural effusion.  There is a mildly enlarged right axillary lymph node inferiorly, measuring 1.3 x 2.3 cm on image #1. This is incompletely visualized and was not demonstrated on the prior examination.  The liver demonstrates mild periportal edema.  There is no significant biliary dilatation.  The gallbladder appears normal. No pancreatic abnormality or pancreatic ductal dilatation is demonstrated.  However, there is mild soft tissue stranding within the fat surrounding the celiac trunk and  extending into the base of the mesentery.  There is no focal fluid collection.  The spleen and adrenal glands appear normal.  There are enlarging low density renal lesions bilaterally which remain most consistent with cysts.  There is no hydronephrosis.  Mild dilatation of the distal left ureter is unchanged from the prior study.  There is no evidence of ureteral calculus or delay in contrast excretion.  No bladder abnormality is evident.  The bowel gas pattern appears normal. The appendix appears normal. No definite inflammatory changes are identified.  There is increased soft tissue in the left aortic region at the level of the kidney suspicious for adenopathy, measuring 2.9 x 1.1 cm on image 34. Portacaval ( 1.9 cm short axis, image 32) and peripancreatic (1.4 cm short axis, image 29) lymph nodes have also enlarged.  In the pelvis, there are numerous prominent external iliac, pelvic side wall and inguinal lymph nodes which are unchanged from the prior study.  There are no acute or suspicious osseous findings.  IMPRESSION:  1.  Retroperitoneal and mesenteric edema consistent with mild pancreatitis in this patient with an elevated serum lipase level. The pancreas itself demonstrates no abnormality. 2. Numerous mildly enlarged lymph nodes are demonstrated within the right axilla, mesentery, pelvis and inguinal regions.  The pelvic involvement appears stable. These are nonspecific and possibly inflammatory.  Clinical correlation and consideration of follow-up CT recommended to exclude a lymphoproliferative process.  3.  Stable mild dilatation of the distal left ureter without evidence of ureteral obstruction. 4.  Interval enlargement of small low-density renal lesions remaining most consistent with cysts.  Original Report Authenticated By: Gerrianne Scale, M.D.   Dg Abd Acute W/chest  02/02/2011  *RADIOLOGY REPORT*  Clinical Data: Mid upper abdominal pain, back pain  ACUTE ABDOMEN SERIES (ABDOMEN 2 VIEW & CHEST  1 VIEW)  Comparison: None  Findings: Normal heart size, mediastinal contours, and pulmonary vascularity. Minimal bronchitic changes without infiltrate or effusion. No pneumothorax. Prominent stool in right colon. Nonobstructive bowel gas pattern. No bowel dilatation, bowel wall thickening or free intraperitoneal air. Osseous demineralization. No urinary tract calcification.  IMPRESSION: No acute abnormalities. Minimal bronchitic changes. Prominent stool in right colon.  Original Report Authenticated By: Lollie Marrow, M.D.  Medications: Scheduled Meds:    . acyclovir  800 mg Intravenous Q8H  . docusate sodium  100 mg Oral BID  . enoxaparin (LOVENOX) injection  40 mg Subcutaneous Q24H  . fentaNYL      . influenza  inactive virus vaccine  0.5 mL Intramuscular Tomorrow-1000  . midazolam      . pantoprazole  40 mg Oral Q1200  . senna  2 tablet Oral QHS  . sodium chloride  3 mL Intravenous Q12H   Continuous Infusions:    . sodium chloride 50 mL/hr (02/08/11 1435)   PRN Meds:.sodium chloride, sodium chloride, acetaminophen, acetaminophen, alum & mag hydroxide-simeth, bisacodyl, fentaNYL, HYDROmorphone, hydrOXYzine, midazolam, ondansetron, oxyCODONE, sodium chloride, zolpidem  Assessment/Plan: 1. Abdominal pain, generalized lymphadenopathy, positive HIV: Await results of HIV viral load, CD4 count is 800. and genotype testing. Patient is in the status post axillary lymph node biopsy done to rule out other causes. Follow biopsy results. 2. Skin rash: Question disseminated zoster. on intravenous acyclovir per ID. 3. Anemia: Possibly chronic disease. Stable 4. Hyponatremia: Question SIADH: Patient clinically euvolemic. Improved. 5. Abdominal pain question secondary to mild pancreatitis. Pain is improved/resolved.  6. Thyroid mass: Awaiting biopsy results.  7. Tobacco abuse: Patient declines a nicotine patch 8. Hypokalemia: Repleted 9. Constipation: Having BMs.  The patient specifically  requests that his mother NOT be made aware of his HIV positive status.  HONGALGI,ANAND 02/08/2011, 4:27 PM

## 2011-02-09 LAB — HIV-1 RNA ULTRAQUANT REFLEX TO GENTYP+
HIV 1 RNA Quant: 669 copies/mL — ABNORMAL HIGH (ref <20)
HIV-1 RNA Quant, Log: 2.83 {Log} — ABNORMAL HIGH (ref <1.30)

## 2011-02-09 MED ORDER — VALACYCLOVIR HCL 500 MG PO TABS
1000.0000 mg | ORAL_TABLET | Freq: Three times a day (TID) | ORAL | Status: DC
  Administered 2011-02-09 – 2011-02-10 (×4): 1000 mg via ORAL
  Filled 2011-02-09 (×6): qty 2

## 2011-02-09 NOTE — Progress Notes (Signed)
Chaplain's Note:  Follow-up visit to pt room.  Pt requested chaplain check back later as was expecting medication.  Thanked chaplain for stopping by and said "I'm doing ok today, though, I think".  Will follow-up as requested or needed.

## 2011-02-09 NOTE — Progress Notes (Signed)
Subjective: Patient seen and examined this am. abdominal pain improving. Was tearful about his HIV status.  Objective:  Vital signs in last 24 hours:  Filed Vitals:   02/08/11 1902 02/08/11 2029 02/09/11 0627 02/09/11 1433  BP: 103/57 106/61 125/62 114/62  Pulse: 65 72 64 67  Temp: 99.7 F (37.6 C) 98.2 F (36.8 C) 98.2 F (36.8 C) 98.9 F (37.2 C)  TempSrc:  Oral Oral   Resp: 20 20 19 18   Height:      Weight:      SpO2: 99% 100% 100% 100%    Intake/Output from previous day:   Intake/Output Summary (Last 24 hours) at 02/09/11 1858 Last data filed at 02/09/11 1700  Gross per 24 hour  Intake   1080 ml  Output    850 ml  Net    230 ml    Physical Exam:  General: middle aged male in no acute distress. HEENT: no pallor, no icterus, moist oral mucosa, no JVD, rt axillary LN Heart: Normal  s1 &s2  Regular rate and rhythm, without murmurs, rubs, gallops. Lungs: Clear to auscultation bilaterally. Abdomen: Soft, nontender, nondistended, positive bowel sounds. Extremities: No clubbing cyanosis or edema with positive pedal pulses. Area of crusting rash voer the scalp and upper back Neuro: Alert, awake, oriented x3, nonfocal.   Lab Results:  Basic Metabolic Panel:    Component Value Date/Time   NA 133* 02/08/2011 0520   K 3.8 02/08/2011 0520   CL 101 02/08/2011 0520   CO2 25 02/08/2011 0520   BUN 11 02/08/2011 0520   CREATININE 0.96 02/08/2011 0520   GLUCOSE 96 02/08/2011 0520   CALCIUM 8.5 02/08/2011 0520   CBC:    Component Value Date/Time   WBC 5.4 02/08/2011 0520   HGB 9.3* 02/08/2011 0520   HCT 27.1* 02/08/2011 0520   PLT 265 02/08/2011 0520   MCV 90.3 02/08/2011 0520   NEUTROABS 2.4 02/07/2011 0515   LYMPHSABS 1.9 02/07/2011 0515   MONOABS 0.4 02/07/2011 0515   EOSABS 0.1 02/07/2011 0515   BASOSABS 0.1 02/07/2011 0515    Recent Results (from the past 240 hour(s))  VIRAL CULTURE VIRC     Status: Normal (Preliminary result)   Collection Time   02/04/11  6:20 PM   Component Value Range Status Comment   Specimen Description HEAD   Final    Special Requests NONE   Final    Culture CONTINUING TO HOLD   Final    Report Status PENDING   Incomplete   URINE CULTURE     Status: Normal   Collection Time   02/04/11  6:45 PM      Component Value Range Status Comment   Specimen Description URINE, CLEAN CATCH   Final    Special Requests NONE   Final    Setup Time [id number]   Final    Colony Count NO GROWTH   Final    Culture NO GROWTH   Final    Report Status 02/06/2011 FINAL   Final   PATHOLOGIST SMEAR REVIEW     Status: Normal   Collection Time   02/06/11  5:00 AM      Component Value Range Status Comment   Tech Review MILD ANEMIA WITH NORMAL   Final   VIRAL CULTURE VIRC     Status: Normal (Preliminary result)   Collection Time   02/06/11 12:10 PM      Component Value Range Status Comment   Specimen Description OTHER SIDE  Final    Special Requests NONE   Final    Culture Culture has been initiated.   Final    Report Status PENDING   Incomplete   PATHOLOGIST SMEAR REVIEW     Status: Normal   Collection Time   02/06/11  1:05 PM      Component Value Range Status Comment   Tech Review MILD ANEMIA WITH NORMAL   Final     Studies/Results: US Biopsy  02/08/2011  *RADIOLOGY REPORT*  Clinical Data/Indication: Abnormal adenopathy.  HIV positive.  ULTRASOUND-GUIDED BIOPSY OF A RIGHT AXILLARY LYMPH NODE.  CORE.  Sedation: Versed two mg, Fentanyl 50 mcg.  Total Moderate Sedation Time: 15 minutes.  Procedure: The procedure, risks, benefits, and alternatives were explained to the patient. Questions regarding the procedure were encouraged and answered. The patient understands and consents to the procedure.  The right axilla was prepped with betadine in a sterile fashion, and a sterile drape was applied covering the operative field. A sterile gown and sterile gloves were used for the procedure.  Under sonographic guidance, 818 gauge core biopsies of a right  axillary lymph node were obtained. Final imaging was performed.  Patient tolerated the procedure well without complication.  Vital sign monitoring by nursing staff during the procedure will continue as patient is in the special procedures unit for post procedure observation.  Findings: The images document guide needle placement within the enlarged right axillary lymph node. Post biopsy images demonstrate no hemorrhage.  IMPRESSION: Successful ultrasound-guided core biopsy of an enlarged right axillary lymph node.  Original Report Authenticated By: Donavan Burnet, M.D.    Medications: Scheduled Meds:   . docusate sodium  100 mg Oral BID  . enoxaparin (LOVENOX) injection  40 mg Subcutaneous Q24H  . fentaNYL      . midazolam      . pantoprazole  40 mg Oral Q1200  . senna  2 tablet Oral QHS  . sodium chloride  3 mL Intravenous Q12H  . valACYclovir  1,000 mg Oral TID  . DISCONTD: acyclovir  800 mg Intravenous Q8H   Continuous Infusions:  PRN Meds:.sodium chloride, acetaminophen, acetaminophen, alum & mag hydroxide-simeth, bisacodyl, HYDROmorphone, hydrOXYzine, ondansetron, oxyCODONE, sodium chloride, zolpidem  Assessment 55 y/o AA homosexual  male presenting with epigastric and LUQ pain with findings of generalised lymphadenopathy, anemia and concerns for zoster over the scalp and  Found  to be HIV positive.  PLAN: Abdominal pain,  generalized lymphadenopathy with  positive HIV:  HIV viral load of 669, CD4 count is 800.  genotype testing pending . - status post axillary lymph node biopsy done to rule out other causes. Follow biopsy results. Also had finding of thyroid mass which was biopsied and results pending ID Nathan Wood) following. recommends outpt follow for HIV and given high CD4 and low VL is good candidate for trial treatment.   Rash Unclear for disseminated zoster  started on IV acyclovir  added valtrex today  and recommended for 14 day treatment  viral PCR  pending  Lymphadenopathy  likely related to HIV  bx results pending  abd pain Possibly related to mild pancreatitis Now resolved  dispo  possibly home tomorrow if symptoms continue  to improve.  Patient doesnot want his mother to know about his HIV status   LOS: 5 days   Novella Abraha 02/09/2011, 6:58 PM

## 2011-02-09 NOTE — Progress Notes (Signed)
Subjective: Less upset today  Objective: Weight change:   Intake/Output Summary (Last 24 hours) at 02/09/11 1437 Last data filed at 02/09/11 1300  Gross per 24 hour  Intake   1080 ml  Output   1050 ml  Net     30 ml   Blood pressure 114/62, pulse 67, temperature 98.9 F (37.2 C), temperature source Oral, resp. rate 18, height 6\' 4"  (1.93 m), weight 192 lb (87.091 kg), SpO2 100.00%. Temp:  [98.1 F (36.7 C)-99.7 F (37.6 C)] 98.9 F (37.2 C) (12/05 1433) Pulse Rate:  [54-80] 67  (12/05 1433) Resp:  [18-29] 18  (12/05 1433) BP: (95-125)/(44-76) 114/62 mmHg (12/05 1433) SpO2:  [96 %-100 %] 100 % (12/05 1433)  Physical Exam: General: Alert and awake, oriented x3, not in any acute distress, fairly irritated HEENT: anicteric sclera, pupils reactive to light and accommodation, EOMI CVS regular rate, normal r,  no murmur rubs or gallops Chest: clear to auscultation bilaterally, no wheezing, rales or rhonchi Abdomen: soft nontender, nondistended, normal bowel sounds, Extremities: no  clubbing or edema noted bilaterally Skin:  Diffuse rash vesciles, macules papules scalp, nearly all are crusted, he has bx site on neck and in axilla  Lab Results:  [facility] 02/08/11 0520 02/07/11 0515  WBC 5.4 4.9  HGB 9.3* 9.3*  HCT 27.1* 26.7*  PLT 265 228   BMET  Basename 02/08/11 0520 02/07/11 0515  NA 133* 129*  K 3.8 3.3*  CL 101 97  CO2 25 25  GLUCOSE 96 104*  BUN 11 13  CREATININE 0.96 0.93  CALCIUM 8.5 8.4    Micro Results: Recent Results (from the past 240 hour(s))  VIRAL CULTURE VIRC     Status: Normal (Preliminary result)   Collection Time   02/04/11  6:20 PM      Component Value Range Status Comment   Specimen Description HEAD   Final    Special Requests NONE   Final    Culture Culture has been initiated.   Final    Report Status PENDING   Incomplete   URINE CULTURE     Status: Normal   Collection Time   02/04/11  6:45 PM      Component Value Range Status Comment   Specimen Description URINE, CLEAN CATCH   Final    Special Requests NONE   Final    Setup Time [id number]   Final    Colony Count NO GROWTH   Final    Culture NO GROWTH   Final    Report Status 02/06/2011 FINAL   Final   PATHOLOGIST SMEAR REVIEW     Status: Normal   Collection Time   02/06/11  5:00 AM      Component Value Range Status Comment   Tech Review MILD ANEMIA WITH NORMAL   Final   VIRAL CULTURE VIRC     Status: Normal (Preliminary result)   Collection Time   02/06/11 12:10 PM      Component Value Range Status Comment   Specimen Description OTHER SIDE   Final    Special Requests NONE   Final    Culture Culture has been initiated.   Final    Report Status PENDING   Incomplete   PATHOLOGIST SMEAR REVIEW     Status: Normal   Collection Time   02/06/11  1:05 PM      Component Value Range Status Comment   Tech Review MILD ANEMIA WITH NORMAL   Final     Studies/Results:  Dg Chest 2 View  02/04/2011  *RADIOLOGY REPORT*  Clinical Data: Chest pain  CHEST - 2 VIEW  Comparison: 02/02/2011  Findings: Stable bronchitic changes and interstitial prominence. Normal heart size. Reduced lung volumes.  No pneumothorax or pleural effusion.  IMPRESSION: Bronchitic changes.  Original Report Authenticated By: Donavan Burnet, M.D.   Ct Chest W Contrast  02/04/2011  *RADIOLOGY REPORT*  Clinical Data: Left lower chest pain.  CT CHEST WITH CONTRAST  Technique:  Multidetector CT imaging of the chest was performed following the standard protocol during bolus administration of intravenous contrast.  Contrast: 80mL OMNIPAQUE IOHEXOL 300 MG/ML IV SOLN  Comparison: Chest radiograph 02/04/2011 and CT abdomen 02/04/2011  Findings: There is enlargement and heterogeneity of the left thyroid lobe.  Small lymph nodes in the supraclavicular region. There are enlarged bilateral axillary lymph nodes.  Index right axillary lymph node measures 2.8 cm on image 20.  There are also bilateral sub pectoralis lymph nodes.  No  significant mediastinal or hilar lymphadenopathy.  There is periportal edema in the liver. Limited evaluation of the upper abdominal structures.  The may be a calcification involving the left main coronary artery.  The trachea and mainstem bronchi are patent.  Mild paraseptal emphysematous changes at the lung apices.  Volume loss and atelectasis at the lung bases.  Irregular nodular structure in the right lower lobe on image 47, sequence 6 measures up to 8 mm. This area is more conspicuous than on the recent abdominal CT.  There is a fuzzy 4 mm nodular density in the lingula.  No acute bony abnormality.  IMPRESSION: Extensive lymphadenopathy involving the axilla and sub pectoralis regions bilaterally.  The findings raise concern for a lymphoproliferative process or neoplasm.  There is volume loss in the lower lobes bilaterally. At least two small pulmonary nodules.  The right lower lobe nodule is more conspicuous than the recent abdominal CT and could represent an infectious or inflammatory process.  Enlargement of the left thyroid lobe.  Findings could represent a very large thyroid nodule.  This area could be further evaluated with a dedicated ultrasound.  Original Report Authenticated By: Richarda Overlie, M.D.   US Soft Tissue Head/neck  02/05/2011  *RADIOLOGY REPORT*  Clinical Data: Thyroid mass/nodule.  THYROID ULTRASOUND  Technique: Ultrasound examination of the thyroid gland and adjacent soft tissues was performed.  Comparison: Recent neck CT  Findings:  Right thyroid lobe:  Measures 5.2 x 2.2 x 1.6 cm. Left thyroid lobe:  Measures 5.6 x 3.7 x 3.5 cm. Isthmus:  Measures 0.9 cm.  Focal nodules:  Several predominately solid and nearly isoechoic nodules are seen, in the left greater lobe.  The largest discrete nodule in the left lobe is in the lower pole measures 2.4 x 1.6 x 2.0 cm.  Vascularity is seen within the nodule.  No calcifications are seen in the nodule.  LEFT LOBE:  2 upper pole solid nodules are  identified in the left lobe, one measuring 2.1 x 1.8 x 2.0 cm and one measuring 1.6 x 1.4 x 1.3 cm.  Mid pole thyroid nodule measures 1.4 x 1.5 x 1.4 cm.  RIGHT LOBE:  5 mm focal area of calcification is seen in the mid pole.  ISTHMUS:  Predominately solid nodule the isthmus measures 1.4 X 0.6 x 1.4 cm.  Lymphadenopathy:  None visualized.  IMPRESSION: Multiple left lobe of thyroid nodules and a single isthmic nodule. Ultrasound-guided fine needle aspiration of the largest nodule in the left lobe, in the lower  lobe could be considered.  If not performed, a 74-month follow-up thyroid ultrasound would be suggested.  Original Report Authenticated By: Britta Mccreedy, M.D.   Ct Abdomen Pelvis W Contrast  02/04/2011  *RADIOLOGY REPORT*  Clinical Data: Left lower quadrant abdominal pain for a few days.  CT ABDOMEN AND PELVIS WITH CONTRAST  Technique:  Multidetector CT imaging of the abdomen and pelvis was performed following the standard protocol during bolus administration of intravenous contrast.  Contrast: OMNIPAQUE IOHEXOL 300 MG/ML IV SOLN  Comparison: acute abdominal series 02/02/2011.  Abdominal pelvic CT 05/21/2008.  Findings: Mild atelectasis is present at both lung bases.  There is no significant pleural effusion.  There is a mildly enlarged right axillary lymph node inferiorly, measuring 1.3 x 2.3 cm on image #1. This is incompletely visualized and was not demonstrated on the prior examination.  The liver demonstrates mild periportal edema.  There is no significant biliary dilatation.  The gallbladder appears normal. No pancreatic abnormality or pancreatic ductal dilatation is demonstrated.  However, there is mild soft tissue stranding within the fat surrounding the celiac trunk and extending into the base of the mesentery.  There is no focal fluid collection.  The spleen and adrenal glands appear normal.  There are enlarging low density renal lesions bilaterally which remain most consistent with cysts.   There is no hydronephrosis.  Mild dilatation of the distal left ureter is unchanged from the prior study.  There is no evidence of ureteral calculus or delay in contrast excretion.  No bladder abnormality is evident.  The bowel gas pattern appears normal. The appendix appears normal. No definite inflammatory changes are identified.  There is increased soft tissue in the left aortic region at the level of the kidney suspicious for adenopathy, measuring 2.9 x 1.1 cm on image 34. Portacaval ( 1.9 cm short axis, image 32) and peripancreatic (1.4 cm short axis, image 29) lymph nodes have also enlarged.  In the pelvis, there are numerous prominent external iliac, pelvic side wall and inguinal lymph nodes which are unchanged from the prior study.  There are no acute or suspicious osseous findings.  IMPRESSION:  1.  Retroperitoneal and mesenteric edema consistent with mild pancreatitis in this patient with an elevated serum lipase level. The pancreas itself demonstrates no abnormality. 2. Numerous mildly enlarged lymph nodes are demonstrated within the right axilla, mesentery, pelvis and inguinal regions.  The pelvic involvement appears stable. These are nonspecific and possibly inflammatory.  Clinical correlation and consideration of follow-up CT recommended to exclude a lymphoproliferative process.  3.  Stable mild dilatation of the distal left ureter without evidence of ureteral obstruction. 4.  Interval enlargement of small low-density renal lesions remaining most consistent with cysts.  Original Report Authenticated By: Gerrianne Scale, M.D.   Dg Abd Acute W/chest  02/02/2011  *RADIOLOGY REPORT*  Clinical Data: Mid upper abdominal pain, back pain  ACUTE ABDOMEN SERIES (ABDOMEN 2 VIEW & CHEST 1 VIEW)  Comparison: None  Findings: Normal heart size, mediastinal contours, and pulmonary vascularity. Minimal bronchitic changes without infiltrate or effusion. No pneumothorax. Prominent stool in right colon.  Nonobstructive bowel gas pattern. No bowel dilatation, bowel wall thickening or free intraperitoneal air. Osseous demineralization. No urinary tract calcification.  IMPRESSION: No acute abnormalities. Minimal bronchitic changes. Prominent stool in right colon.  Original Report Authenticated By: Lollie Marrow, M.D.    Antibiotics:  Anti-infectives     Start     Dose/Rate Route Frequency Ordered Stop   02/06/11 1500  acyclovir (ZOVIRAX) 800 mg in dextrose 5 % 150 mL IVPB        800 mg 166 mL/hr over 60 Minutes Intravenous 3 times per day 02/06/11 1425            Medications: Scheduled Meds:    . acyclovir  800 mg Intravenous Q8H  . docusate sodium  100 mg Oral BID  . enoxaparin (LOVENOX) injection  40 mg Subcutaneous Q24H  . fentaNYL      . midazolam      . pantoprazole  40 mg Oral Q1200  . senna  2 tablet Oral QHS  . sodium chloride  3 mL Intravenous Q12H   Continuous Infusions:  PRN Meds:.sodium chloride, acetaminophen, acetaminophen, alum & mag hydroxide-simeth, bisacodyl, fentaNYL, HYDROmorphone, hydrOXYzine, midazolam, ondansetron, oxyCODONE, sodium chloride, zolpidem  Assessment/Plan: Nathan Wood is a 55 y.o. male with  Newly Dx HIV (EIA positive) admitted with pancreatitis, found to have diffuse rash, lymphadenopathy and thyroid nodule   Rash:Has crusted viral cx pending RPR negative. Mx possible explanations --change from IV to oral valtrex to finish 14 day course to treat possible VZV   HIV: He definitely has HIV, VL is lwo in 800s, CD4 high  He would actually be excellent candidate for START.   --I will followup with him in [facility] where we will plug int to the ADAP program and consider START vs ACTG trial vs starting rx   Pancreatitis: --not clear cause  LYmphadenopathy": --could be due to his HIV itself. Certainly maliganncy remains on differential as well. BX was not excisional --so he may still need this to pin down dx. If he undergoes excisionsal would  send for AFB fungal and bacterial cx in addition to path  Thyroid nodule: -bx pending   LOS: 5 days   Acey Lav 02/09/2011, 2:37 PM

## 2011-02-09 NOTE — Progress Notes (Signed)
ANTIBIOTIC CONSULT NOTE - FOLLOW UP  Pharmacy Consult for   Acyclovir Indication:  Disseminated Zoster  No Known Allergies  Patient Measurements: Height: 6\' 4"  (193 cm) Weight: 192 lb (87.091 kg) IBW/kg (Calculated) : 86.8  Dosing weight 87 kg  Vital Signs: Temp: 98.2 F (36.8 C) (12/05 0627) Temp src: Oral (12/05 0627) BP: 125/62 mmHg (12/05 0627) Pulse Rate: 64  (12/05 0627) Intake/Output from previous day: 12/04 0701 - 12/05 0700 In: 480 [P.O.:480] Out: 850 [Urine:850]    Labs:  [facility] 02/08/11 0520 02/07/11 0515  WBC 5.4 4.9  HGB 9.3* 9.3*  PLT 265 228  LABCREA -- --  CREATININE 0.96 0.93   Estimated Creatinine Clearance: 106.7 ml/min (by C-G formula based on Cr of 0.96).   Assessment:   Day # 4 Acyclovir 800 mg IV q8hrs (~10 mg/kg/dose)   Renal function stable.    Viral cultures pending.  Goal of Therapy:     Appropriated dose for renal function and infection.  Plan:     Continue Acyclovir 800 mg IV q8hrs.    Will recheck bmet by 12/7 and follow-up plans.  Dennie Fetters Pager:  [phone] 02/09/2011,12:07 PM

## 2011-02-10 DIAGNOSIS — B20 Human immunodeficiency virus [HIV] disease: Secondary | ICD-10-CM | POA: Diagnosis present

## 2011-02-10 DIAGNOSIS — K859 Acute pancreatitis without necrosis or infection, unspecified: Principal | ICD-10-CM | POA: Diagnosis present

## 2011-02-10 LAB — UIFE/LIGHT CHAINS/TP QN, 24-HR UR
Albumin, U: DETECTED
Beta, Urine: DETECTED — AB
Free Kappa/Lambda Ratio: 9.6 ratio (ref 2.04–10.37)
Free Lambda Lt Chains,Ur: 55.3 mg/dL — ABNORMAL HIGH (ref 0.02–0.67)
Total Protein, Urine: 607.2 mg/dL

## 2011-02-10 MED ORDER — VALACYCLOVIR HCL 1 G PO TABS: 1000.0000 mg | ORAL_TABLET | Freq: Three times a day (TID) | ORAL | Status: AC

## 2011-02-10 MED ORDER — VALACYCLOVIR HCL 1 G PO TABS: 1000.0000 mg | ORAL_TABLET | Freq: Three times a day (TID) | ORAL | Status: DC

## 2011-02-10 NOTE — Progress Notes (Signed)
Clinical Social Work:  CSW completed full assessment and placed in shadow chart.  CSW provided supportive counseling and gave patient information to the [facility] for further support.  Patient was accepting and appreciative of resources.Nathan Wood, [name], [name], #[phone]

## 2011-02-10 NOTE — Progress Notes (Signed)
02/10/2011 [facility], Bosie Clos SPAR[facility] Case Management Note [phone]    CARE MANAGEMENT NOTE 02/10/2011  Patient:  Nathan Wood, Nathan Wood   Account Number:  [id number]  Date Initiated:  02/08/2011  Documentation initiated by:  [facility]  Subjective/Objective Assessment:   Admitted with abdomen pain. new 042 dx.     Action/Plan:   Prior to admission, patient lived at home with support from family and friends. Independent with ADLs   Anticipated DC Date:  02/09/2011   Anticipated DC Plan:  HOME/SELF CARE  In-house referral  Clinical Social Worker      DC Planning Services  CM consult      Choice offered to / List presented to:             Status of service:  Completed, signed off Medicare Important Message given?   (If response is "NO", the following Medicare IM given date fields will be blank) Date Medicare IM given:   Date Additional Medicare IM given:    Discharge Disposition:  HOME/SELF CARE  Per UR Regulation:  Reviewed for med. necessity/level of care/duration of stay  Comments:  infecctious dx following patient  PCP: none Contact: Sumedh Shinsato [facility]): [phone]  02/10/11- [address]Lutricia Horsfall  [phone]       55yo male patient admitted on 02/04/11 with c/o abdominal pain. Patient with new 042 diagnosis. Prior to admission, patient lived at home with support from family and friends. Independent with ADLs. Plan is to discharge back home today to self care. Noted that patient does not have a primary care physician.In to speak with patient, regarding PCP. Reports that he just started a new job and his medical insurance should be active in January, 2013. Prefers not to be established with [facility] at this time. Talked with patient regarding process of finding a PCP through [facility] at [phone]. Information also placed on discharge follow up. No further discharge needs identified.

## 2011-02-10 NOTE — Discharge Summary (Signed)
Patient ID: Nathan Wood MRN: [id number] DOB/AGE: [birth year] 55 y.o.  Admit date: 02/04/2011 Discharge date: 02/10/2011  Primary Care Physician:  Provider Not In System  Discharge Diagnoses:    Present on Admission:  .Abdominal pain, acute, epigastric .varicella zoster infection .HIV (human immunodeficiency virus infection) .Hyperproteinemia .Proteinuria .Diffuse Lymphadenopathy  .thyroid mass( non malignant) .Anemia .Acute mild  pancreatitis    Current Discharge Medication List    START taking these medications   Details  valACYclovir (VALTREX) 1000 MG tablet Take 1 tablet (1,000 mg total) by mouth 3 (three) times daily for 10 days Qty: 30 tablet, Refills: 0      CONTINUE these medications which have NOT CHANGED   Details  famotidine (PEPCID) 20 MG tablet Take 1 tablet (20 mg total) by mouth 2 (two) times daily. Qty: 30 tablet, Refills: 0    loratadine (CLARITIN) 10 MG tablet Take 10 mg by mouth daily.        STOP taking these medications     polyethylene glycol powder (GLYCOLAX/MIRALAX) powder         Disposition and Follow-up: Patient will be followed by health services for appointment Patient will be called from ID clinic for follow up appt.  Consults:   Zenaida Niece damm ( ID)  Significant Diagnostic Studies:  Dg Chest 2 View  02/04/2011  *RADIOLOGY REPORT*  Clinical Data: Chest pain  CHEST - 2 VIEW  Comparison: 02/02/2011  Findings: Stable bronchitic changes and interstitial prominence. Normal heart size. Reduced lung volumes.  No pneumothorax or pleural effusion.  IMPRESSION: Bronchitic changes.  Original Report Authenticated By: Donavan Burnet, M.D.   Ct Chest W Contrast  02/04/2011  *RADIOLOGY REPORT*  Clinical Data: Left lower chest pain.  CT CHEST WITH CONTRAST  Technique:  Multidetector CT imaging of the chest was performed following the standard protocol during bolus administration of intravenous contrast.  Contrast: 80mL OMNIPAQUE IOHEXOL 300 MG/ML  IV SOLN  Comparison: Chest radiograph 02/04/2011 and CT abdomen 02/04/2011  Findings: There is enlargement and heterogeneity of the left thyroid lobe.  Small lymph nodes in the supraclavicular region. There are enlarged bilateral axillary lymph nodes.  Index right axillary lymph node measures 2.8 cm on image 20.  There are also bilateral sub pectoralis lymph nodes.  No significant mediastinal or hilar lymphadenopathy.  There is periportal edema in the liver. Limited evaluation of the upper abdominal structures.  The may be a calcification involving the left main coronary artery.  The trachea and mainstem bronchi are patent.  Mild paraseptal emphysematous changes at the lung apices.  Volume loss and atelectasis at the lung bases.  Irregular nodular structure in the right lower lobe on image 47, sequence 6 measures up to 8 mm. This area is more conspicuous than on the recent abdominal CT.  There is a fuzzy 4 mm nodular density in the lingula.  No acute bony abnormality.  IMPRESSION: Extensive lymphadenopathy involving the axilla and sub pectoralis regions bilaterally.  The findings raise concern for a lymphoproliferative process or neoplasm.  There is volume loss in the lower lobes bilaterally. At least two small pulmonary nodules.  The right lower lobe nodule is more conspicuous than the recent abdominal CT and could represent an infectious or inflammatory process.  Enlargement of the left thyroid lobe.  Findings could represent a very large thyroid nodule.  This area could be further evaluated with a dedicated ultrasound.  Original Report Authenticated By: Richarda Overlie, M.D.   Ct Abdomen Pelvis W Contrast  02/04/2011  *  RADIOLOGY REPORT*  Clinical Data: Left lower quadrant abdominal pain for a few days.  CT ABDOMEN AND PELVIS WITH CONTRAST  Technique:  Multidetector CT imaging of the abdomen and pelvis was performed following the standard protocol during bolus administration of intravenous contrast.  Contrast:  OMNIPAQUE IOHEXOL 300 MG/ML IV SOLN  Comparison: acute abdominal series 02/02/2011.  Abdominal pelvic CT 05/21/2008.  Findings: Mild atelectasis is present at both lung bases.  There is no significant pleural effusion.  There is a mildly enlarged right axillary lymph node inferiorly, measuring 1.3 x 2.3 cm on image #1. This is incompletely visualized and was not demonstrated on the prior examination.  The liver demonstrates mild periportal edema.  There is no significant biliary dilatation.  The gallbladder appears normal. No pancreatic abnormality or pancreatic ductal dilatation is demonstrated.  However, there is mild soft tissue stranding within the fat surrounding the celiac trunk and extending into the base of the mesentery.  There is no focal fluid collection.  The spleen and adrenal glands appear normal.  There are enlarging low density renal lesions bilaterally which remain most consistent with cysts.  There is no hydronephrosis.  Mild dilatation of the distal left ureter is unchanged from the prior study.  There is no evidence of ureteral calculus or delay in contrast excretion.  No bladder abnormality is evident.  The bowel gas pattern appears normal. The appendix appears normal. No definite inflammatory changes are identified.  There is increased soft tissue in the left aortic region at the level of the kidney suspicious for adenopathy, measuring 2.9 x 1.1 cm on image 34. Portacaval ( 1.9 cm short axis, image 32) and peripancreatic (1.4 cm short axis, image 29) lymph nodes have also enlarged.  In the pelvis, there are numerous prominent external iliac, pelvic side wall and inguinal lymph nodes which are unchanged from the prior study.  There are no acute or suspicious osseous findings.  IMPRESSION:  1.  Retroperitoneal and mesenteric edema consistent with mild pancreatitis in this patient with an elevated serum lipase level. The pancreas itself demonstrates no abnormality. 2. Numerous mildly enlarged  lymph nodes are demonstrated within the right axilla, mesentery, pelvis and inguinal regions.  The pelvic involvement appears stable. These are nonspecific and possibly inflammatory.  Clinical correlation and consideration of follow-up CT recommended to exclude a lymphoproliferative process.  3.  Stable mild dilatation of the distal left ureter without evidence of ureteral obstruction. 4.  Interval enlargement of small low-density renal lesions remaining most consistent with cysts.  Original Report Authenticated By: Gerrianne Scale, M.D.    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 He was initially seen on 02/02/2011 and underwent an evaluation with blood work including cardiac markers and EKG and was discharged home with the clinical diagnosis of GERD and constipation. At that time his total protein was 10.4 and his hemoglobin was 10.8. His acute abdomen series showed no acute abnormalities with only minimal bronchitic changes. The patient went home in stable conditions. Today the patient returned at around 6 AM with persistent epigastric and left upper quadrant abdominal pain. The pain is described as sharp and rated 6/10 in severity. At times it got worse. The pain radiates to his mid back and lower back. He has no vomiting or diarrhea  but he has been having constipation. He took a laxative and had a bowel movement today. He denies sensory or, frequency or hematuria. He denies fever or chills or night sweats. He denies recent travel history or exposure to sick contacts. He has not been sexually active for the last 6 months. His last alcoholic beverage was in Thanksgiving when he only took 2 glasses of wine. He denies using drugs.  Today, in emergency department he received morphine and oral  Percocet with partial improvement. A workup was repeated. He had repeated blood work and was found with a mild elevation in his lipase. Subsequently the patient underwent a CT scan of the abdomen with contrast as described below. The patient states that after he went home he developed a rash all over his head neck and back which he thinks is a drug allergy. It is associated to some itching.      Physical Exam on Discharge:  Filed Vitals:   02/09/11 0627 02/09/11 1433 02/09/11 2217 02/10/11 0554  BP: 125/62 114/62 106/51 106/60  Pulse: 64 67 60 69  Temp: 98.2 F (36.8 C) 98.9 F (37.2 C) 98.8 F (37.1 C) 98.4 F (36.9 C)  TempSrc: Oral  Oral Oral  Resp: 19 18 16 16   Height:      Weight:      SpO2: 100% 100% 99% 99%     Intake/Output Summary (Last 24 hours) at 02/10/11 1441 Last data filed at 02/09/11 1700  Gross per 24 hour  Intake    240 ml  Output    100 ml  Net    140 ml    General: middle aged male in no acute distress.  HEENT: no pallor, no icterus, moist oral mucosa, no JVD, rt axillary LN  Heart: Normal s1 &s2 Regular rate and rhythm, without murmurs, rubs, gallops.  Lungs: Clear to auscultation bilaterally.  Abdomen: Soft, nontender, nondistended, positive bowel sounds.  Extremities: No clubbing cyanosis or edema with positive pedal pulses. Area of crusting rash over the scalp and upper back  Neuro: Alert, awake, oriented x3, nonfocal.   CBC:    Component Value Date/Time   WBC 5.4 02/08/2011 0520   HGB 9.3* 02/08/2011 0520   HCT 27.1* 02/08/2011 0520   PLT 265 02/08/2011 0520   MCV 90.3 02/08/2011 0520   NEUTROABS 2.4 02/07/2011 0515   LYMPHSABS 1.9 02/07/2011 0515   MONOABS 0.4 02/07/2011 0515   EOSABS 0.1 02/07/2011 0515   BASOSABS 0.1 02/07/2011 0515    Basic Metabolic Panel:    Component Value Date/Time   NA 133* 02/08/2011 0520   K 3.8 02/08/2011 0520   CL 101 02/08/2011 0520   CO2 25 02/08/2011 0520   BUN 11 02/08/2011 0520   CREATININE 0.96 02/08/2011  0520   GLUCOSE 96 02/08/2011 0520   CALCIUM 8.5 02/08/2011 0520    Hospital Course:   Abdominal pain Patient presented with epigastric and LUQ pain with findings of generalised lymphadenopathy, anemia and concerns for zoster over the scalp and Found to be HIV positive.  -patient had mildly elevated lipase on presentation suggesting possibility of mild pancreatitis for his abdominal pain. -CT abdomen and pelvis on admission showed Retroperitoneal and mesenteric edema consistent with mild pancreatitis in this patient with an elevated serum lipase level. Numerous mildly enlarged lymph nodes are demonstrated within the right axilla, mesentery, pelvis and inguinal regions.   -his symptoms now resolved with pain medications.  New HIV positive: generalized lymphadenopathy with positive HIV, confirmed with  Western blot. HIV viral load of 669, CD4 count is 800. genotype testing pending .  - patient had axillary lymph node biopsy done to rule out other causes. Results pending and will be followed up in ID clinic  ( discussed with Dr Zenaida Niece damm) Also had finding of thyroid mass which was biopsied and showed to be lymphadenopathy and non malignant goitre ID  Consult followed patient . recommends outpt follow for HIV and given high CD4 and low VL is good candidate for trial treatment.  -Hepatitis panel negative -RPR negative - GC probe negative  Disseminated zoster with rash over scalp and upper back Secondary to varicella zoster, viral PCR positive for  for VZ started on IV acyclovir as per ID recs ( on 12/2) Switched to po valtrex to complete a total 14 day course ( until 12/16)  Lymphadenopathy  likely related to HIV  Axillary bx results pending tyhroid bx results of non malignant lymphadenopathy   Hyperproteinemia SPEP shows gammaglobulinemia.  UPEP with negative bence jones protein but elevated kappa and lambda chains.   Anemia Iron panel suggests low serum iron and iron sats B12 Wnl       Patient is clinically stable and can be discharged with outpatient follow up. He was quite depressed with concern for his new illness. i have discussed at length regarding treatments, protective sex and routine outpatient follow up . He is in a very positive mood today.   Time spent on Discharge: 45 minutes  Signed: Eddie North 02/10/2011, 2:41 PM

## 2011-02-10 NOTE — Progress Notes (Signed)
Chaplain's Note:  Follow-up visit with pt.  Arrived at room and pt no longer on restrictions.  Pt sitting up in bed.  Pt welcomed chaplain.  Provided pastoral presence and support.  Pt being d/c today.  Shared conversation about life and future hopes.  Pt looking forward to getting a new puppy.  Encouraged pt to seek a faith community as he had mentioned he wanted to.  Offered prayer to pt, he accepted.  Held pt hand and prayed for blessing as he leaves.  No follow-up needed.

## 2011-02-11 LAB — VARICELLA-ZOSTER BY PCR: Varicella-Zoster, PCR: DETECTED — AB

## 2011-02-11 LAB — HIV-1 RNA QUANT-NO REFLEX-BLD: HIV-1 RNA Quant, Log: 2.54 {Log} — ABNORMAL HIGH (ref <1.30)

## 2011-02-14 ENCOUNTER — Ambulatory Visit (INDEPENDENT_AMBULATORY_CARE_PROVIDER_SITE_OTHER): Payer: Self-pay | Admitting: Internal Medicine

## 2011-02-14 ENCOUNTER — Encounter: Payer: Self-pay | Admitting: Internal Medicine

## 2011-02-14 VITALS — BP 98/68 | HR 102 | Temp 98.4°F | Ht 73.0 in | Wt 198.0 lb

## 2011-02-14 DIAGNOSIS — L0291 Cutaneous abscess, unspecified: Secondary | ICD-10-CM

## 2011-02-14 DIAGNOSIS — L0390 Cellulitis, unspecified: Secondary | ICD-10-CM

## 2011-02-14 LAB — VIRAL CULTURE VIRC: Culture: DETECTED

## 2011-02-14 MED ORDER — DOXYCYCLINE HYCLATE 100 MG PO TABS: 100.0000 mg | ORAL_TABLET | Freq: Two times a day (BID) | ORAL | Status: AC

## 2011-02-17 ENCOUNTER — Telehealth: Payer: Self-pay | Admitting: Licensed Clinical Social Worker

## 2011-02-18 ENCOUNTER — Ambulatory Visit (INDEPENDENT_AMBULATORY_CARE_PROVIDER_SITE_OTHER): Payer: Self-pay | Admitting: Internal Medicine

## 2011-02-18 VITALS — BP 119/70 | HR 78 | Temp 98.1°F | Wt 200.0 lb

## 2011-02-18 DIAGNOSIS — L0292 Furuncle, unspecified: Secondary | ICD-10-CM

## 2011-02-18 DIAGNOSIS — L0293 Carbuncle, unspecified: Secondary | ICD-10-CM

## 2011-02-18 NOTE — Telephone Encounter (Signed)
  Scheduled patient to see Dr. Daiva Eves next month per physicians request

## 2011-02-18 NOTE — Progress Notes (Signed)
HIV CLINIC NOTE - SICK VISIT RFV: progressive lip lesion  Subjective:    Patient ID: Nathan Wood, male    DOB: 12/30/1955, 55 y.o.   MRN: [id number]  HPI Nathan Wood is a 55yo AAM with HIV, CD4 count 870 (49%)/ VL 348, being treated for disseminated zoster and presented to the clinic 4 days ago for new onset of left lip swelling, thought to be a deep tissue infection related to a furuncle originating on vermillion border of left upper lip. He started doxycycline 100mg  BID that evening. He now presents to clinic after receiving 3 full days of antibiotics with some minor improvement in the swelling of his lip, but over the last day, several small "whiteheads" have popped up.The one located on the most ventral aspect of his lip is starting to drain on its own.  Meds: Valtrex 1gm TID Doxycycline 100mg  BID   Review of Systems Review of Systems  Constitutional: Negative for fever, chills, diaphoresis, activity change, appetite change, fatigue and unexpected weight change.  HENT: Negative for congestion, sore throat, rhinorrhea, sneezing, trouble swallowing and sinus pressure.  Eyes: Negative for photophobia and visual disturbance.  Respiratory: Negative for cough, chest tightness, shortness of breath, wheezing and stridor.  Cardiovascular: Negative for chest pain, palpitations and leg swelling.  Gastrointestinal: Negative for nausea, vomiting, abdominal pain, diarrhea, constipation, blood in stool, abdominal distention and anal bleeding.  Genitourinary: Negative for dysuria, hematuria, flank pain and difficulty urinating.  Musculoskeletal: Negative for myalgias, back pain, joint swelling, arthralgias and gait problem.  Skin: lip is "still significantly swollen with white bumps" now Neurological: Negative for dizziness, tremors, weakness and light-headedness.  Hematological: Negative for adenopathy. Does not bruise/bleed easily.  Psychiatric/Behavioral: Negative for behavioral problems,  confusion, sleep disturbance, dysphoric mood, decreased concentration and agitation.       Objective:   Physical Exam  HENT:  Head:         Edematous left side of lip, with 4 small whitish lesions, 1 in left upper corner, 1 medial, 1 ventral aspec (draining)t, 1 lesion between upper nad ventral lesions.   BP 119/70  Pulse 78  Temp(Src) 98.1 F (36.7 C) (Oral)  Wt 200 lb (90.719 kg)        Assessment & Plan:  A) furuncle/carbuncle on the lip which is evolving while on doxycycline.  Over all the carbuncle is maturing and starting to auto-drain. The swelling on the superior aspect, just beneath the nares is improved.  Will continue on doxycycline. And patient come back to clinic on Monday to see if needs micro I X D by plastics.  Disseminated zoster= patient to finish course of treatment in 2 days of valtrex 1gm TID.  RTC in 3 days

## 2011-02-21 ENCOUNTER — Ambulatory Visit (INDEPENDENT_AMBULATORY_CARE_PROVIDER_SITE_OTHER): Payer: Self-pay | Admitting: Internal Medicine

## 2011-02-21 ENCOUNTER — Inpatient Hospital Stay: Payer: Self-pay | Admitting: Infectious Diseases

## 2011-02-21 ENCOUNTER — Ambulatory Visit: Payer: Self-pay

## 2011-02-21 ENCOUNTER — Encounter: Payer: Self-pay | Admitting: Internal Medicine

## 2011-02-21 VITALS — BP 124/66 | HR 75 | Temp 98.4°F | Wt 202.0 lb

## 2011-02-21 DIAGNOSIS — L0202 Furuncle of face: Secondary | ICD-10-CM

## 2011-02-21 DIAGNOSIS — L0203 Carbuncle of face: Secondary | ICD-10-CM

## 2011-02-21 NOTE — Progress Notes (Signed)
HIV Clinic Note  RFV: follow up to lip carbuncle/furuncle  HPI Mr. Nathan Wood is a 55yo AAM with HIV, CD4 count 870 (49%)/ VL 348, being treated for disseminated zoster  And was seen a few times in clinic last week due to worsening furuncle/carbuncle originating on vermillion border of left upper lip. He started doxycycline 100mg  BID on Day #8 of 10 . Since being seen 3 days ago when he had persistent lip edema, and the most ventral aspect of his lip is starting to drain on its own, his lips now look more symmetric. His lip is no longer draining. He did nick the upper aspect of it when trimming his moustache today. He is still taking his doxycycline. No fever,chills, nightsweats, swelling tremendously improved.  Patient is still processing his recent HIV diagnosis. He is asking when he can start HAARt. He wants to do what is best. He as disclosed his status to 2 people at work, but none of his family members know as of yet. He does have a younger brother with HIV who does not live near here.  Review of Systems  Constitutional: Negative for fever, chills, diaphoresis, activity change, appetite change, fatigue and unexpected weight change.  HENT: Negative for congestion, sore throat, rhinorrhea, sneezing, trouble swallowing and sinus pressure.  Eyes: Negative for photophobia and visual disturbance.  Respiratory: Negative for cough, chest tightness, shortness of breath, wheezing and stridor.  Cardiovascular: Negative for chest pain, palpitations and leg swelling.  Gastrointestinal: Negative for nausea, vomiting, abdominal pain, diarrhea, constipation, blood in stool, abdominal distention and anal bleeding.  Genitourinary: Negative for dysuria, hematuria, flank pain and difficulty urinating.  Musculoskeletal: Negative for myalgias, back pain, joint swelling, arthralgias and gait problem.  Skin: Negative for color change, pallor, rash and wound.  Neurological: Negative for dizziness, tremors, weakness and  light-headedness.  Hematological: Negative for adenopathy. Does not bruise/bleed easily.  Psychiatric/Behavioral: Negative for behavioral problems, confusion, sleep disturbance, dysphoric mood, decreased concentration and agitation.    Meds:   Doxycycline 100mg  BID  PE: BP 124/66  Pulse 75  Temp(Src) 98.4 F (36.9 C) (Oral)  Wt 202 lb (91.627 kg) Skin exam : lip is nearly symmetric. Still has 2 small healing lesions, ventral aspect and RUQ lesions. No fluctuance. No pain. No erythema  A/P:  Carbuncle: Continue with doxycycline. If still swollen in 3 days will extend his course to 14 days of treatment  HIV: patient to come back in January to discuss starting ART

## 2011-02-22 ENCOUNTER — Ambulatory Visit: Payer: Self-pay | Admitting: Internal Medicine

## 2011-02-22 LAB — WOUND CULTURE

## 2011-02-25 ENCOUNTER — Telehealth: Payer: Self-pay | Admitting: *Deleted

## 2011-02-25 NOTE — Telephone Encounter (Signed)
States he left papers with Dr. Drue Second yesterday & wanted to know if they were done. I checked with md. She will have them done this pm. I asked him to come by at 4:30 today to get them

## 2011-02-28 ENCOUNTER — Telehealth: Payer: Self-pay | Admitting: *Deleted

## 2011-02-28 LAB — HIV-1 GENOTYPR PLUS

## 2011-02-28 NOTE — Telephone Encounter (Signed)
Called patient to advise that the forms he dropped off are ready to be picked up. He advised that he is out of town and will be here to get forms on Thursday 03/03/11 in the morning. Told him that was fine they will be up front waiting.

## 2011-03-14 NOTE — Progress Notes (Signed)
HIV CLINIC NOTE  RFV: swollen lip  Subjective:    Patient ID: Nathan Wood, male    DOB: 1955-05-19, 56 y.o.   MRN: [id number]  HPI  Nathan Wood is a 55yo AAM with HIV, CD4 count 870 (49%)/ VL 348, recently diagnosed with HIV in the last month, in the setting of being diagnosed and treated for disseminated zoster. He presents to the ID clinic with history of 3- 4 days ago for new onset of left lip swelling. He did notice a small pimple at the edge of his lips, and may have aggravated it by shaving. He denies any vesicular lesions in his mouth, or buccal mucosa/hard palate. No lesions near his nose. He is concerned because his lip is becoming quite enlarged in a short period of time.  Active Ambulatory Problems    Diagnosis Date Noted  . Abdominal pain, acute, epigastric 02/04/2011  . Hyperproteinemia 02/04/2011  . Proteinuria 02/04/2011  . Lymphadenopathy 02/04/2011  . Anemia 02/04/2011  . Rash and nonspecific skin eruption 02/04/2011  . HIV (human immunodeficiency virus infection) 02/10/2011  . Acute pancreatitis 02/10/2011   Resolved Ambulatory Problems    Diagnosis Date Noted  . No Resolved Ambulatory Problems   Past Medical History  Diagnosis Date  . No pertinent past medical history    Allergies: No Known Allergies Prior to Admission medications   Medication Sig Start Date End Date Taking? Authorizing Provider  famotidine (PEPCID) 20 MG tablet Take 1 tablet (20 mg total) by mouth 2 (two) times daily. 02/02/11 02/02/12 Yes Jennifer A Pitylak, PA  loratadine (CLARITIN) 10 MG tablet Take 10 mg by mouth daily.     Yes Historical Provider, MD  valACYclovir (VALTREX) 1000 MG tablet Take 1 tablet (1,000 mg total) by mouth 3 (three) times daily. 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 He lives on his own. Uses public transportation. He is still processing his diagnosis. He has not disclosed his status to family or friends  yet. No smoking. No alcohol. No ilicit drug use  family history includes Coronary artery disease in his father and Diabetes in his mother.  Review of Systems  Constitutional: Negative for fever, chills, diaphoresis, activity change, appetite change, fatigue and unexpected weight change.  HENT: Negative for congestion, sore throat, rhinorrhea, sneezing, trouble swallowing and sinus pressure.  Eyes: Negative for photophobia and visual disturbance.  Respiratory: Negative for cough, chest tightness, shortness of breath, wheezing and stridor.  Cardiovascular: Negative for chest pain, palpitations and leg swelling.  Gastrointestinal: Negative for nausea, vomiting, abdominal pain, diarrhea, constipation, blood in stool, abdominal distention and anal bleeding.  Genitourinary: Negative for dysuria, hematuria, flank pain and difficulty urinating.  Musculoskeletal: Negative for myalgias, back pain, joint swelling, arthralgias and gait problem.  Skin: POSITIVE for lip swelling, slightly painful over the last 3 days. Neurological: Negative for dizziness, tremors, weakness and light-headedness.  Hematological: Negative for adenopathy. Does not bruise/bleed easily.  Psychiatric/Behavioral: Negative for behavioral problems, confusion, sleep disturbance, dysphoric mood, decreased concentration and agitation.       Objective:   Physical Exam BP 98/68  Pulse 102  Temp(Src) 98.4 F (36.9 C) (Oral)  Ht 6\' 1"  (1.854 m)  Wt 198 lb (89.812 kg)  BMI 26.12 kg/m2  General Appearance:    Alert, cooperative, no distress, appears stated age  Head:    Normocephalic, without obvious abnormality, atraumatic  Eyes:    PERRL, conjunctiva/corneas clear, EOM's intact,  bilaterally  Ears:    Normal TM's and external ear canals, both ears  Nose:   Nares normal, septum midline, mucosa normal, no drainage   or sinus tenderness  Throat:   Lips are markedly swollen on the left greater than right side- no fluctuance or  white-heads noted within the swelling. It does not pass the midline yet. Some induration to soft tissue below nares, mucosa appears normal, and tongue normal; teeth and gums normal  Neck:   Supple, symmetrical, trachea midline, no adenopathy;   Back:     Symmetric, no curvature, ROM normal, no CVA tenderness  Lungs:     Clear to auscultation bilaterally, respirations unlabored     Heart:    Regular rate and rhythm, S1 and S2 normal, no murmur, rub   or gallop  Abdomen:     Soft, non-tender, bowel sounds active all four quadrants,    no masses, no organomegaly        Extremities:   Extremities normal, atraumatic, no cyanosis or edema  Pulses:   2+ and symmetric all extremities  Skin:   Skin color, texture, turgor normal, no rashes or lesions  Lymph nodes:   Cervical, supraclavicular, and axillary nodes normal  Neurologic:   CNII-XII intact. Normal strength, sensation and reflexes      throughout         Assessment & Plan:    deep tissue infection related to a furuncle originating on vermillion border of left upper lip. We will prescribe doxycycline 100mg  BID x 14 days, and have the patient follow up within the 5 days to see how it is progressing and if he needs referral to plastics for I X D.  HIV= will check quantiferon and toxo titer. Patient's CD 4 count is > 500. We will discuss with patient when he would like to start ART. May need to consider to bridge counselors as well as support groups  Disseminated zoster= continue to finish his course of valtrex

## 2011-03-18 ENCOUNTER — Other Ambulatory Visit: Payer: Self-pay | Admitting: Internal Medicine

## 2011-03-23 ENCOUNTER — Other Ambulatory Visit (INDEPENDENT_AMBULATORY_CARE_PROVIDER_SITE_OTHER): Payer: 59

## 2011-03-23 DIAGNOSIS — Z21 Asymptomatic human immunodeficiency virus [HIV] infection status: Secondary | ICD-10-CM

## 2011-03-23 DIAGNOSIS — B20 Human immunodeficiency virus [HIV] disease: Secondary | ICD-10-CM

## 2011-03-23 LAB — CBC WITH DIFFERENTIAL/PLATELET
Basophils Absolute: 0 10*3/uL (ref 0.0–0.1)
Basophils Relative: 1 % (ref 0–1)
Eosinophils Absolute: 0.2 10*3/uL (ref 0.0–0.7)
MCH: 31.9 pg (ref 26.0–34.0)
MCHC: 32.6 g/dL (ref 30.0–36.0)
Neutrophils Relative %: 41 % — ABNORMAL LOW (ref 43–77)
Platelets: 281 10*3/uL (ref 150–400)
RBC: 3.04 MIL/uL — ABNORMAL LOW (ref 4.22–5.81)
RDW: 14.5 % (ref 11.5–15.5)

## 2011-03-23 LAB — COMPREHENSIVE METABOLIC PANEL
ALT: 18 U/L (ref 0–53)
Albumin: 3.2 g/dL — ABNORMAL LOW (ref 3.5–5.2)
CO2: 26 mEq/L (ref 19–32)
Calcium: 9 mg/dL (ref 8.4–10.5)
Chloride: 103 mEq/L (ref 96–112)
Creat: 1.21 mg/dL (ref 0.50–1.35)

## 2011-03-24 LAB — T-HELPER CELL (CD4) - (RCID CLINIC ONLY): CD4 T Cell Abs: 720 uL (ref 400–2700)

## 2011-03-25 LAB — HIV-1 RNA QUANT-NO REFLEX-BLD: HIV 1 RNA Quant: 1439 copies/mL — ABNORMAL HIGH (ref <20)

## 2011-04-07 ENCOUNTER — Encounter: Payer: Self-pay | Admitting: Internal Medicine

## 2011-04-07 ENCOUNTER — Ambulatory Visit (INDEPENDENT_AMBULATORY_CARE_PROVIDER_SITE_OTHER): Payer: 59 | Admitting: Internal Medicine

## 2011-04-07 VITALS — BP 124/74 | HR 72 | Temp 98.1°F | Ht 73.0 in | Wt 199.0 lb

## 2011-04-07 DIAGNOSIS — B20 Human immunodeficiency virus [HIV] disease: Secondary | ICD-10-CM

## 2011-04-07 DIAGNOSIS — Z23 Encounter for immunization: Secondary | ICD-10-CM

## 2011-04-07 DIAGNOSIS — Z21 Asymptomatic human immunodeficiency virus [HIV] infection status: Secondary | ICD-10-CM

## 2011-04-11 NOTE — Progress Notes (Signed)
HIV CLINIC VISIT  RFV: routine follow up Subjective:    Patient ID: Nathan Wood, male    DOB: 04-14-1955, 56 y.o.   MRN: [id number]  HPI Nathan Wood recently diagnosed with HIV in 2012, CD 4  Of 720 , VL of 1439   ART naive. Recovered from shingles and carbuncle of lip. Still processing his recent diagnosis. Returned to work without difficutly. No fever/chills/nightsweats/n/v/d/rash/ but weight loss+  Prior to Admission medications   Medication Sig Start Date End Date Taking? Authorizing Provider  famotidine (PEPCID) 20 MG tablet Take 1 tablet (20 mg total) by mouth 2 (two) times daily. 02/02/11 02/02/12 Yes Jennifer A Pitylak, PA  Multiple Vitamin (MULTIVITAMIN) capsule Take 1 capsule by mouth daily.   Yes Historical Provider, MD  vitamin B-12 (CYANOCOBALAMIN) 100 MCG tablet Take 100 mcg by mouth daily.   Yes Historical Provider, MD  loratadine (CLARITIN) 10 MG tablet Take 10 mg by mouth daily.      Historical Provider, MD  valACYclovir (VALTREX) 1000 MG tablet Take 1 tablet (1,000 mg total) by mouth 3 (three) times daily. 02/10/11 02/20/12  Theda Belfast Dhungel, MD   Active Ambulatory Problems    Diagnosis Date Noted  . Abdominal pain, acute, epigastric 02/04/2011  . Hyperproteinemia 02/04/2011  . Proteinuria 02/04/2011  . Lymphadenopathy 02/04/2011  . Anemia 02/04/2011  . Rash and nonspecific skin eruption 02/04/2011  . HIV (human immunodeficiency virus infection) 02/10/2011  . Acute pancreatitis 02/10/2011   Resolved Ambulatory Problems    Diagnosis Date Noted  . No Resolved Ambulatory Problems   Past Medical History  Diagnosis Date  . No pertinent past medical history      Review of Systems 10 point review of systems are negative with exception to weight loss he is now at 199, but previously weight 217 prior to diagnosis    Objective:   Physical Exam BP 124/74  Pulse 72  Temp(Src) 98.1 F (36.7 C) (Oral)  Ht 6\' 1"  (1.854 m)  Wt 199 lb (90.266 kg)  BMI 26.25 kg/m2 Physical  Exam  Constitutional: He is oriented to person, place, and time. He appears well-developed and well-nourished. No distress.  HENT:  Mouth/Throat: Oropharynx is clear and moist. No oropharyngeal exudate.  Cardiovascular: Normal rate, regular rhythm and normal heart sounds. Exam reveals no gallop and no friction rub.  No murmur heard.  Pulmonary/Chest: Effort normal and breath sounds normal. No respiratory distress. He has no wheezes.  Abdominal: Soft. Bowel sounds are normal. He exhibits no distension. There is no tenderness.  Lymphadenopathy:  He has no cervical adenopathy.  Neurological: He is alert and oriented to person, place, and time.  Skin: Skin is warm and dry. No rash noted. No erythema.  Psychiatric: He has a normal mood and affect. His behavior is normal.   Labs: Chol 98/tg 178/ hdl 6/ ldl 56    Assessment & Plan:  HIV- will give him a referral to support group to help process his recent diagnosis. He states that he has disclosed to a few co-workers, and his Production designer, theatre/television/film, but has not disclosed to his family. He does have a brother that has HIV  Health promotion- will offer flu vaccine   unintentional weight loss= will keep monitoring, low threshold for malignancy work up and will check for B-symptoms   low HDL = continue with fish oil, would increase to BID  rtc in 2-3 months

## 2011-04-19 ENCOUNTER — Telehealth: Payer: Self-pay | Admitting: Licensed Clinical Social Worker

## 2011-04-19 NOTE — Telephone Encounter (Signed)
Patient walked in with paperwork from his dentist office for Dr. Drue Second to sign, I placed in her in box at her pod where Feliz Beam, CMA is sitting. He also was concerned with some low blood pressure readings accompanied by dizziness. Fri-92/50, Sat.-86/50, 92/56 Sun 103/61. I made him an appointment with Traci Sermon on 2/12 at 2:15

## 2011-04-20 ENCOUNTER — Encounter: Payer: Self-pay | Admitting: Adult Health

## 2011-04-20 ENCOUNTER — Ambulatory Visit (INDEPENDENT_AMBULATORY_CARE_PROVIDER_SITE_OTHER): Payer: 59 | Admitting: Adult Health

## 2011-04-20 DIAGNOSIS — R42 Dizziness and giddiness: Secondary | ICD-10-CM

## 2011-04-20 DIAGNOSIS — I959 Hypotension, unspecified: Secondary | ICD-10-CM

## 2011-04-21 DIAGNOSIS — R42 Dizziness and giddiness: Secondary | ICD-10-CM | POA: Insufficient documentation

## 2011-04-21 DIAGNOSIS — I959 Hypotension, unspecified: Secondary | ICD-10-CM | POA: Insufficient documentation

## 2011-04-21 NOTE — Assessment & Plan Note (Signed)
Postural in nature, but it only occurred when his blood pressure was low and has not recurred since his blood pressure has normalized. Instructed that anytime that he feels dizzy or lightheaded, he is to sit down, and if the symptoms persist, he should either contact the clinic or go to urgent care.

## 2011-04-21 NOTE — Progress Notes (Signed)
Subjective:  Presents with a 2 day episode over the past weekend of positional lightheadedness, and low blood pressure. He was concerned given his recent diagnosis of HIV, as well as some of his other problems, that this might be associated with this. However, after increasing fluid intake, and staying off his feet, the symptoms subsided, and not to return. He just wanted to make sure her that everything was okay.  Review of Systems - General ROS: negative for - fatigue, fever, hot flashes or malaise Psychological ROS: positive for - anxiety, mood swings and feelings of stress regarding his diagnosis. negative for - behavioral disorder, depression, irritability or memory difficulties Ophthalmic ROS: negative for - blurry vision, decreased vision, double vision or loss of vision ENT ROS: negative for - headaches or visual changes Respiratory ROS: no cough, shortness of breath, or wheezing Cardiovascular ROS: no chest pain or dyspnea on exertion Gastrointestinal ROS: no abdominal pain, change in bowel habits, or black or bloody stools Neurological ROS: no TIA or stroke symptoms   Objective:   General appearance: alert, cooperative and no distress Head: Normocephalic, without obvious abnormality, atraumatic Eyes: conjunctivae/corneas clear. PERRL, EOM's intact. Fundi benign. Ears: normal TM's and external ear canals both ears Throat: lips, mucosa, and tongue normal; teeth and gums normal Resp: clear to auscultation bilaterally Cardio: regular rate and rhythm, S1, S2 normal, no murmur, click, rub or gallop GI: soft, non-tender; bowel sounds normal; no masses,  no organomegaly Extremities: extremities normal, atraumatic, no cyanosis or edema Neurologic: Alert and oriented X 3, normal strength and tone. Normal symmetric reflexes. Normal coordination and gait Psych:  No vegetative signs or delusional behaviors noted.   Assessment/Plan:  Dizziness Postural in nature, but it only occurred  when his blood pressure was low and has not recurred since his blood pressure has normalized. Instructed that anytime that he feels dizzy or lightheaded, he is to sit down, and if the symptoms persist, he should either contact the clinic or go to urgent care.  Hypotension This is a subjective report as he had complained of dizziness, and his coworkers check his blood pressure, which occurred over the weekend. After hydrating himself, he said symptoms went away. I instructed him that this may be a transient problem, but should it become a pattern further evaluation would be necessary. He should contact the clinic if another episode like this occurs in the near future. Physical exam was benign.      Tyechia Allmendinger A. Sundra Aland, MS, [facility] for Infectious Disease [phone]  04/21/2011,  12:22 AM

## 2011-04-21 NOTE — Assessment & Plan Note (Signed)
This is a subjective report as he had complained of dizziness, and his coworkers check his blood pressure, which occurred over the weekend. After hydrating himself, he said symptoms went away. I instructed him that this may be a transient problem, but should it become a pattern further evaluation would be necessary. He should contact the clinic if another episode like this occurs in the near future. Physical exam was benign.

## 2011-04-28 ENCOUNTER — Telehealth: Payer: Self-pay | Admitting: *Deleted

## 2011-04-28 ENCOUNTER — Ambulatory Visit: Payer: 59 | Admitting: Internal Medicine

## 2011-04-28 NOTE — Telephone Encounter (Signed)
Patient no showed his appt. Called the patient and the number was disconnected. Not able to reach the patient due to this.

## 2011-04-28 NOTE — Telephone Encounter (Signed)
Travis: Please refer this patient to [facility] counseling so that they can try and reach them. Thanks Asher Muir

## 2011-05-24 ENCOUNTER — Other Ambulatory Visit: Payer: Self-pay | Admitting: Licensed Clinical Social Worker

## 2011-05-24 DIAGNOSIS — B20 Human immunodeficiency virus [HIV] disease: Secondary | ICD-10-CM

## 2011-06-21 ENCOUNTER — Other Ambulatory Visit: Payer: 59

## 2011-06-22 ENCOUNTER — Other Ambulatory Visit: Payer: 59

## 2011-06-22 DIAGNOSIS — B20 Human immunodeficiency virus [HIV] disease: Secondary | ICD-10-CM

## 2011-06-22 LAB — COMPREHENSIVE METABOLIC PANEL
ALT: 25 U/L (ref 0–53)
Alkaline Phosphatase: 70 U/L (ref 39–117)
Creat: 1.26 mg/dL (ref 0.50–1.35)
Glucose, Bld: 87 mg/dL (ref 70–99)
Sodium: 133 mEq/L — ABNORMAL LOW (ref 135–145)
Total Bilirubin: 0.3 mg/dL (ref 0.3–1.2)
Total Protein: 10.2 g/dL — ABNORMAL HIGH (ref 6.0–8.3)

## 2011-06-22 LAB — LIPID PANEL
HDL: 26 mg/dL — ABNORMAL LOW (ref >39)
Triglycerides: 166 mg/dL — ABNORMAL HIGH (ref <150)

## 2011-06-22 LAB — CBC WITH DIFFERENTIAL/PLATELET
Basophils Relative: 1 % (ref 0–1)
Eosinophils Absolute: 0.2 10*3/uL (ref 0.0–0.7)
Eosinophils Relative: 5 % (ref 0–5)
MCH: 31.1 pg (ref 26.0–34.0)
MCHC: 32.5 g/dL (ref 30.0–36.0)
MCV: 95.7 fL (ref 78.0–100.0)
Neutrophils Relative %: 46 % (ref 43–77)
Platelets: 280 10*3/uL (ref 150–400)

## 2011-06-23 ENCOUNTER — Telehealth: Payer: Self-pay | Admitting: Licensed Clinical Social Worker

## 2011-06-23 ENCOUNTER — Other Ambulatory Visit: Payer: Self-pay | Admitting: *Deleted

## 2011-06-23 DIAGNOSIS — R778 Other specified abnormalities of plasma proteins: Secondary | ICD-10-CM

## 2011-06-23 LAB — T-HELPER CELL (CD4) - (RCID CLINIC ONLY): CD4 T Cell Abs: 710 uL (ref 400–2700)

## 2011-06-23 NOTE — Telephone Encounter (Signed)
[facility] lab called stating that patient had total protein of 10.2

## 2011-06-23 NOTE — Progress Notes (Signed)
Per provider, per alert from [facility] of patient total protein of 10.2.

## 2011-06-23 NOTE — Telephone Encounter (Signed)
Provider notified and wants the patient to come to clinic to have more labs drawn. Called patient number on record and was not able to get him nor was I able to leave a message. Will try again later.

## 2011-06-24 LAB — HIV-1 RNA QUANT-NO REFLEX-BLD
HIV 1 RNA Quant: 1601 copies/mL — ABNORMAL HIGH (ref <20)
HIV-1 RNA Quant, Log: 3.2 {Log} — ABNORMAL HIGH (ref <1.30)

## 2011-06-27 ENCOUNTER — Telehealth: Payer: Self-pay | Admitting: *Deleted

## 2011-06-27 ENCOUNTER — Ambulatory Visit (HOSPITAL_COMMUNITY)
Admission: RE | Admit: 2011-06-27 | Discharge: 2011-06-27 | Disposition: A | Discharge status: Home / Self Care | Payer: 59 | Attending: Psychiatry | Admitting: Psychiatry

## 2011-06-27 ENCOUNTER — Encounter (HOSPITAL_COMMUNITY): Payer: Self-pay | Admitting: *Deleted

## 2011-06-27 ENCOUNTER — Telehealth: Payer: Self-pay | Admitting: Licensed Clinical Social Worker

## 2011-06-27 ENCOUNTER — Other Ambulatory Visit: Payer: 59

## 2011-06-27 DIAGNOSIS — R778 Other specified abnormalities of plasma proteins: Secondary | ICD-10-CM

## 2011-06-27 DIAGNOSIS — F411 Generalized anxiety disorder: Secondary | ICD-10-CM | POA: Insufficient documentation

## 2011-06-27 DIAGNOSIS — Z21 Asymptomatic human immunodeficiency virus [HIV] infection status: Secondary | ICD-10-CM | POA: Insufficient documentation

## 2011-06-27 DIAGNOSIS — F17200 Nicotine dependence, unspecified, uncomplicated: Secondary | ICD-10-CM | POA: Insufficient documentation

## 2011-06-27 DIAGNOSIS — F4321 Adjustment disorder with depressed mood: Secondary | ICD-10-CM | POA: Insufficient documentation

## 2011-06-27 HISTORY — DX: Major depressive disorder, single episode, unspecified: F32.9

## 2011-06-27 HISTORY — DX: Depression, unspecified: F32.A

## 2011-06-27 HISTORY — DX: Anxiety disorder, unspecified: F41.9

## 2011-06-27 HISTORY — DX: Human immunodeficiency virus (HIV) disease: B20

## 2011-06-27 NOTE — Telephone Encounter (Signed)
Patient called stating that he had back pain with painful urination. I asked the patient to give a urine sample and I added a urine dipstick with GC and chlamydia

## 2011-06-27 NOTE — BH Assessment (Signed)
Assessment Note   Nathan Wood is an 56 y.o. male who presented to the [facility] escorted by staff from Infectious Disease Clinic.  Pt presented as tearful, depressed and very anxious. Pt denied SI/HI, A/V hallucinations and delusions. Pt stated he received a dx of HIV after being treated for pancreatitis in December of 2012.  Pt stated that he has been having difficulty working ever since.  Pt states that he has been very tearful and anxious at work.  Pt admits "I go into my office and close the door and cry for the rest of the day."  Pt states that he has limited supports "My mother is 88yo and this would kill her."  Pt states that his brother is also HIV+ and "I thought I would be taking care of him"  With prompting the pt admitted that his brother is in perfect health and works full time with his dx.  Pt understands that his dx is not a "death sentence" but "It is different when it's you".  Pt states that he needs time and people to talk too so that "I can accepts this and move on with my life".  Pt was anxious and tearful throughout the assessment.  Pt stated that he will soon begin HIV medications instead of waiting the usual period.  Pt is motivated to start therapy and request MH assistance multiple times.    Axis I: Adjustment Disorder with Depressed Mood Axis II: Deferred Axis III:  Past Medical History  Diagnosis Date  . Human immunodeficiency virus   . Anxiety   . Depression    Axis IV: occupational problems, other psychosocial or environmental problems, problems related to social environment and problems with primary support group Axis V: 51-60 moderate symptoms  Past Medical History:  Past Medical History  Diagnosis Date  . Human immunodeficiency virus   . Anxiety   . Depression     Past Surgical History  Procedure Date  . Knee surgery   . Pancreatitis     Family History:  Family History  Problem Relation Age of Onset  . Diabetes Mother   . Coronary artery disease Father      Social History:  reports that he has been smoking Cigarettes.  He has a 5 pack-year smoking history. He does not have any smokeless tobacco history on file. He reports that he drinks about one ounce of alcohol per week. He reports that he does not use illicit drugs.  Additional Social History:  Alcohol / Drug Use History of alcohol / drug use?: No history of alcohol / drug abuse Allergies: No Known Allergies  Home Medications:  (Not in a hospital admission)  OB/GYN Status:  No LMP for male patient.  General Assessment Data Location of Assessment: [facility] Living Arrangements: Other relatives Can pt return to current living arrangement?: Yes Admission Status: Other (Comment) (IOP) Is patient capable of signing voluntary admission?: Yes Transfer from: Home Referral Source: Self/Family/Friend     Risk to self Suicidal Ideation: No Suicidal Intent: No Is patient at risk for suicide?: No Suicidal Plan?: No Access to Means: No What has been your use of drugs/alcohol within the last 12 months?: none Previous Attempts/Gestures: No How many times?: 0  Other Self Harm Risks: recent medical diagnosis Triggers for Past Attempts: None known Intentional Self Injurious Behavior: None Family Suicide History: No Recent stressful life event(s): Recent negative physical changes (dx of HIV) Persecutory voices/beliefs?: No Depression: Yes Depression Symptoms: Insomnia;Tearfulness;Isolating;Fatigue;Guilt;Loss of interest in usual  pleasures Substance abuse history and/or treatment for substance abuse?: No Suicide prevention information given to non-admitted patients: Yes  Risk to Others Homicidal Ideation: No Thoughts of Harm to Others: No Current Homicidal Intent: No Current Homicidal Plan: No Access to Homicidal Means: No Identified Victim: none History of harm to others?: No Assessment of Violence: None Noted Violent Behavior Description: none Does patient have  access to weapons?: No Criminal Charges Pending?: No Does patient have a court date: No  Psychosis Hallucinations: None noted Delusions: None noted  Mental Status Report Appear/Hygiene: Other (Comment) (unremarkable) Eye Contact: Good Motor Activity: Agitation Speech: Logical/coherent Level of Consciousness: Alert Mood: Depressed;Anxious;Sad Affect: Sad;Appropriate to circumstance Anxiety Level: Severe Thought Processes: Coherent;Relevant Judgement: Unimpaired Orientation: Person;Place;Time;Situation Obsessive Compulsive Thoughts/Behaviors: None  Cognitive Functioning Concentration: Normal Memory: Recent Intact;Remote Intact IQ: Average Insight: Fair Impulse Control: Good Appetite: Fair Weight Loss: 5  Weight Gain: 0  Sleep: Decreased Total Hours of Sleep: 5  Vegetative Symptoms: None  Prior Inpatient Therapy Prior Inpatient Therapy: No Prior Therapy Dates: none Prior Therapy Facilty/Provider(s): none Reason for Treatment: none  Prior Outpatient Therapy Prior Outpatient Therapy: No Prior Therapy Dates: none Prior Therapy Facilty/Provider(s): none Reason for Treatment: none          Abuse/Neglect Assessment (Assessment to be complete while patient is alone) Physical Abuse: Denies Verbal Abuse: Denies Sexual Abuse: Denies Exploitation of patient/patient's resources: Denies Self-Neglect: Denies          Additional Information 1:1 In Past 12 Months?: No CIRT Risk: No Elopement Risk: No Does patient have medical clearance?: No     Disposition:  Disposition Disposition of Patient: Outpatient treatment Type of outpatient treatment: Psych Intensive Outpatient  On Site Evaluation by:   Reviewed with Physician:  Allena Katz, MD   Ena Dawley Pate 06/27/2011 6:10 PM

## 2011-06-27 NOTE — Telephone Encounter (Signed)
Patient came in today crying uncontrollably, and he states he could not stop crying. He felt like he was very nervous, he was having nightmares, can't sleep and can't eat. I asked him what was on his mind that made him feel so sad, he stated that he can't get over his recent HIV diagnosis and he has nobody to talk to about. He states that he can't be the person that he used to be before his diagnosis, although he knows that is not true. He has been out of work for three days due to overwhelming feelings of sadness and depression. He states he does not have the energy to work, and he needs to relax his mind of racing thoughts. He denied feeling suicidal or homicidal. He did not have a plan to hurt himself. He admits to thinking about what it would be like if was not living, but he did not want to hurt himself. He agreed to go to [facility] by bus, he stated he would be ok to go by bus. He later came back stating that he could not make it on the bus due to uncontrollable sobbing. Security will take him to [facility] along with a RN from the clinic as an escort.

## 2011-06-27 NOTE — Telephone Encounter (Signed)
States he is very depressed. Is not on any meds for this. Wants [place] to do something. I offered him an appt today. He is crying . Lives alone & has not told anyone about his diagnosis. Denies SI but was slow to answer my question about this. I had him agree to not harm him self. I asked that if he started thinking about self harm, he is to call 911 & go to ED. He agreed. I told him we have a counselor here & she can see him as well. He does not have a phone. He was using a friend's phone. Tamika put him on the schedule for later today

## 2011-06-27 NOTE — Telephone Encounter (Signed)
I called Behavioral Health & let them know that he was coming there by bus. He had called earlier in the day crying & depressed. I asked that they call us back when he arrives. She agreed to do this

## 2011-06-28 ENCOUNTER — Ambulatory Visit: Payer: 59 | Admitting: Internal Medicine

## 2011-06-29 LAB — PROTEIN ELECTROPHORESIS, SERUM, WITH REFLEX
Albumin ELP: 33.3 % — ABNORMAL LOW (ref 55.8–66.1)
Alpha-1-Globulin: 2.4 % — ABNORMAL LOW (ref 2.9–4.9)
Beta 2: 5.4 % (ref 3.2–6.5)
Total Protein, Serum Electrophoresis: 11.2 g/dL — ABNORMAL HIGH (ref 6.0–8.3)

## 2011-06-29 LAB — IGG, IGA, IGM
IgA: 422 mg/dL — ABNORMAL HIGH (ref 68–379)
IgM, Serum: 307 mg/dL — ABNORMAL HIGH (ref 41–251)

## 2011-06-30 LAB — IFE INTERPRETATION

## 2011-07-01 ENCOUNTER — Ambulatory Visit (INDEPENDENT_AMBULATORY_CARE_PROVIDER_SITE_OTHER): Payer: 59 | Admitting: Psychology

## 2011-07-01 ENCOUNTER — Encounter (HOSPITAL_COMMUNITY): Payer: Self-pay | Admitting: Psychology

## 2011-07-01 DIAGNOSIS — F321 Major depressive disorder, single episode, moderate: Secondary | ICD-10-CM

## 2011-07-01 NOTE — Progress Notes (Signed)
Nathan Wood is a 56 y.o. male patient who was referred to [facility] by Dr. Judyann Munson, Infectious Disease specialist for treatment for depression. He was seen for assessment and referred to IOP, to start today.  When he came and realized it was a group modality, he declined.  He was seen by me due to an opening in my schedule, to facilitate his treatment.  Chief Complaint States he was diagnosed as being HIV positive in Dec. 2012, when hospitalized for acute pancreatitis. 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  For the past month, his obsessing about the diagnosis has gotten worse and he has been crying, even at his office at work, losing sleep due to early morning waking.  He tried throwing himself into his work, adding extra hours, but wore himself out.  He has also been having anxiety, loss of interest, change in sexual interest, guilt, and shame.  He states he has only told his director at work, because she is someone he feels comfortable talking to.  One of his brothers is also HIV pos, but he has not talked to him either.  His symptoms have been getting more difficult to hide for the past 2 weeks and he has now been out of work for a week.  He finds he needs to be out of the house, so has spent much of his time walking. He believes he may have been infected by a man he saw casually in Dec. This man, he has now found out knew he was positive for HIV and did not tell Dorinda Hill.  When Roe Coombs attended his first support group meeting at [facility], this man was there and told his history to the group.  Roe Coombs says he is very angry and wants to confront this man.  History of Present Illness Onset of symptoms in Dec. 2012 when first diagnosed.  At first, he thought he could deal with it and told himself "I can live with this".  Over time, he noticed he was becoming more depressed and less able to behave in his usual  manner at work.  He did start attending meetings at the [facility] for working men with HIV/AIDS.  He denies prior history of any mental health treatment, hospitalizations of medications.  Symptoms have getting progressively worse over past month.  Family History/ Education/ Social History Born and brought up in [place], [place] and attended 2 year college after high school.  He is one of three boys. His father worked for the railroad and was killed in a work related accident when he was a boy.  His mother is living and well (59) in [place] and he has regular contact with her. There is no family history of mental illness.  He is single, has never been married and has identified himself as gay since he was a teen.  Although he believes the family knows about his orientation, his mother has never brought it up to him, and so he has not talked to her about it either.  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  Returned to [place] 6 + years ago to be closer to his mother, should she need him.  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  He has  no close relationships or partners at this time, but does engage in sexual activity from time to time. He lost his driver's license due to many speeding tickets when he was living in [place] and driving to [place] for work.  Now he lives with friends or relatives and uses the bus or walks.  He denies excessive use of any substances and said that since his bout of pancreatitis, he has stopped drinking any liquor, and sticks to small amounts of wine.  Last used marijuana "in high school".  Denies use of other drugs of abuse.  Mental Status Lorena is a tall thin African American man with blunted affect, soft voice tones and and appropriate demeanor consistent with depressed mood.  He states that he finds he cannot "get over the crying" and the constant thinking about his  diagnosis.  His thought processes are linear and goal directed.  He denies any suicidal thinking or homicidal thoughts toward the man he believes may have infected him. He states he is angry with himself for not being consistent with safe sex practices. He demonstrates no psychotic features or delusions.  His BDI is 21/45 indicating at least moderate depression.  He complains of inability to keep his mind on work or any other activity.  Summary and Plan Timber is seeking treatment for depression that began when he was diagnosed as being HIV positive and having an acute viral illness indicating he might have been infected in the days/weeks before he began to feel ill. He has the intellectual understanding that the disease can be treated and that he is not in imminent danger, but his emotional reaction is that of one facing a death sentence.  He reports that he does not want anyone to know this secret, but is finding some relief being part of the [employer] where he can socialize with men who are all infected with HIV. He is anxious to start anti-viral medications and has an appointment with Dr. Drue Second next week for that purpose.  He would like immediate help to stop crying all the time and to help him with getting rest at night.  He has tried taking 50-75 mg of OTC Benadryl, but worries about using that much to sleep.  As he talked, he cried freely and with encouragement shared many concerns.  We talked about what he has already done to help himself:  Joined support group, arranged for FMLA from work to allow him time to recover, decided to change his schedule when he returns to work so that he will have more time for himself, go and spend a month with his mother to rest and recuperate while starting treatment. To that list we added that he will see a psychiatrist for medication evaluation and will see me regularly, every 1-2 weeks over the next several months to work on his coping skills and grief  at the loss of his good health. "I didn't take the health insurance at work, because I never went to the Dr.  I thought I would always stay healthy."  Diagnosis  Axis I  Major Depression, single, moderate   Axis II  Deferred   Axis III  S/P Acute Pancreatitis; HIV +   Axis IV  Acute health concerns; work stress; lack of support system   Axis V  GAF current = 50     Xeng Kucher, RN, APRN, CNS

## 2011-07-05 ENCOUNTER — Ambulatory Visit: Payer: 59 | Admitting: Internal Medicine

## 2011-07-05 NOTE — Telephone Encounter (Signed)
Patient arrived and had blood redrawn

## 2011-07-07 ENCOUNTER — Ambulatory Visit: Payer: 59 | Admitting: Internal Medicine

## 2011-07-07 ENCOUNTER — Ambulatory Visit (HOSPITAL_COMMUNITY): Payer: Self-pay | Admitting: Psychology

## 2011-07-07 ENCOUNTER — Encounter (HOSPITAL_COMMUNITY): Payer: Self-pay

## 2011-07-12 ENCOUNTER — Ambulatory Visit (INDEPENDENT_AMBULATORY_CARE_PROVIDER_SITE_OTHER): Payer: 59 | Admitting: Psychology

## 2011-07-12 DIAGNOSIS — F329 Major depressive disorder, single episode, unspecified: Secondary | ICD-10-CM

## 2011-07-12 NOTE — Progress Notes (Signed)
   THERAPIST PROGRESS NOTE  Session Time: 1015 - 1100  Participation Level: Active  Behavioral Response: Casual and NeatAlertDepressed  Type of Therapy: Individual Therapy  Treatment Goals addressed: Coping  Interventions: Psychosocial Skills: seeking new relationship and Supportive  Summary: Nathan Wood is a 56 y.o. male who presents with improved mood since last meeting, stating he is not crying "as much" as he was.  He has moved back to his mother's house for now and states that he feels comfortable being there for now.  He is continuing to visit the [employer] as often as he can get a ride.  He has started a journal and is beginning to evaluate his goals for the future.  We talked about his desire for a long-term relationship and the barriers he has put up to avoid this for the past 10 years.  Affect is blunted, mood depressed and thinking is logical and looking toward the future.  He reports he continues to have difficulty falling asleep each night, using Benadryl at times to help.  I also suggested a CD designed to help with sleep or the use of alternate means such as acupuncture, Reiki, other body work.    Suicidal/Homicidal: Nowithout intent/plan  Therapist Response: We discussed work and timing for returning to work.  I completed FMLA paperwork for him and will expect to receive short-term disability forms soon.  We set a tentative return to work date of June 3, after he has seen Dr. Lolly Mustache for Medication evaluation. Plan: Return again in 2 weeks.  Diagnosis: Axis I: Major Depression, single episode    Axis II: Deferred    Lindsee Labarre, RN 07/12/2011

## 2011-07-19 ENCOUNTER — Ambulatory Visit (INDEPENDENT_AMBULATORY_CARE_PROVIDER_SITE_OTHER): Payer: 59 | Admitting: Internal Medicine

## 2011-07-19 VITALS — BP 126/72 | HR 69 | Temp 98.4°F | Ht 73.0 in | Wt 201.0 lb

## 2011-07-19 DIAGNOSIS — G4700 Insomnia, unspecified: Secondary | ICD-10-CM

## 2011-07-19 DIAGNOSIS — F411 Generalized anxiety disorder: Secondary | ICD-10-CM

## 2011-07-19 DIAGNOSIS — F419 Anxiety disorder, unspecified: Secondary | ICD-10-CM

## 2011-07-20 ENCOUNTER — Encounter: Payer: Self-pay | Admitting: *Deleted

## 2011-07-20 ENCOUNTER — Encounter: Payer: Self-pay | Admitting: Internal Medicine

## 2011-07-20 NOTE — Progress Notes (Signed)
Patient ID: Nathan Wood, male   DOB: 1956/03/04, 56 y.o.   MRN: [id number] Unable to provide "Return to Work" note for pt at this time.  Will need to be composed by the MD.

## 2011-07-26 ENCOUNTER — Ambulatory Visit (HOSPITAL_COMMUNITY): Payer: Self-pay | Admitting: Psychology

## 2011-07-26 ENCOUNTER — Telehealth: Payer: Self-pay | Admitting: *Deleted

## 2011-07-26 NOTE — Telephone Encounter (Signed)
He came in asking for a note to return to work. Told me he has [employer] thru behavioral health. I instructed him to call them & see about getting the note from them. There is nothing in the OV with Dr. Drue Second to indicate giving a note. He was ok with this & will call them

## 2011-07-28 ENCOUNTER — Encounter (HOSPITAL_COMMUNITY): Payer: Self-pay

## 2011-08-02 MED ORDER — SERTRALINE HCL 50 MG PO TABS: 50.0000 mg | ORAL_TABLET | Freq: Every day | ORAL | Status: DC

## 2011-08-02 MED ORDER — TRAZODONE HCL 50 MG PO TABS: 50.0000 mg | ORAL_TABLET | Freq: Every day | ORAL | Status: DC

## 2011-08-03 ENCOUNTER — Ambulatory Visit (HOSPITAL_COMMUNITY): Payer: Self-pay | Admitting: Psychiatry

## 2011-08-05 ENCOUNTER — Ambulatory Visit (HOSPITAL_COMMUNITY): Payer: Self-pay | Admitting: Psychology

## 2011-08-09 ENCOUNTER — Other Ambulatory Visit: Payer: Self-pay | Admitting: *Deleted

## 2011-08-09 DIAGNOSIS — G4700 Insomnia, unspecified: Secondary | ICD-10-CM

## 2011-08-09 DIAGNOSIS — F419 Anxiety disorder, unspecified: Secondary | ICD-10-CM

## 2011-08-09 MED ORDER — SERTRALINE HCL 50 MG PO TABS: 50.0000 mg | ORAL_TABLET | Freq: Every day | ORAL | Status: DC

## 2011-08-09 MED ORDER — TRAZODONE HCL 50 MG PO TABS: 50.0000 mg | ORAL_TABLET | Freq: Every day | ORAL | Status: DC

## 2011-08-09 NOTE — Telephone Encounter (Signed)
Original rx went to wrong [facility]. Changed to [facility]

## 2011-08-12 ENCOUNTER — Ambulatory Visit (HOSPITAL_COMMUNITY): Payer: Self-pay | Admitting: Psychology

## 2011-09-19 ENCOUNTER — Encounter: Payer: Self-pay | Admitting: Internal Medicine

## 2011-09-19 ENCOUNTER — Ambulatory Visit (INDEPENDENT_AMBULATORY_CARE_PROVIDER_SITE_OTHER): Payer: 59 | Admitting: Internal Medicine

## 2011-09-19 VITALS — BP 127/79 | HR 174 | Temp 98.2°F | Wt 204.0 lb

## 2011-09-19 DIAGNOSIS — D892 Hypergammaglobulinemia, unspecified: Secondary | ICD-10-CM

## 2011-09-19 DIAGNOSIS — D7589 Other specified diseases of blood and blood-forming organs: Secondary | ICD-10-CM

## 2011-09-19 DIAGNOSIS — B20 Human immunodeficiency virus [HIV] disease: Secondary | ICD-10-CM

## 2011-09-19 DIAGNOSIS — Z21 Asymptomatic human immunodeficiency virus [HIV] infection status: Secondary | ICD-10-CM

## 2011-09-19 LAB — CBC WITH DIFFERENTIAL/PLATELET
Basophils Absolute: 0 10*3/uL (ref 0.0–0.1)
Basophils Relative: 1 % (ref 0–1)
Eosinophils Absolute: 0.2 10*3/uL (ref 0.0–0.7)
MCH: 31.4 pg (ref 26.0–34.0)
MCHC: 33.5 g/dL (ref 30.0–36.0)
Neutrophils Relative %: 43 % (ref 43–77)
Platelets: 282 10*3/uL (ref 150–400)

## 2011-09-20 ENCOUNTER — Telehealth: Payer: Self-pay | Admitting: Licensed Clinical Social Worker

## 2011-09-20 LAB — COMPLETE METABOLIC PANEL WITH GFR
ALT: 22 U/L (ref 0–53)
AST: 35 U/L (ref 0–37)
Alkaline Phosphatase: 68 U/L (ref 39–117)
GFR, Est Non African American: 62 mL/min
Sodium: 134 mEq/L — ABNORMAL LOW (ref 135–145)
Total Bilirubin: 0.3 mg/dL (ref 0.3–1.2)
Total Protein: 10.7 g/dL — ABNORMAL HIGH (ref 6.0–8.3)

## 2011-09-20 NOTE — Telephone Encounter (Signed)
He is undergoing work up for his proteinemia. Referred to hem clinic for abn labs.

## 2011-09-20 NOTE — Telephone Encounter (Signed)
Patient has an alert high total protein of 10.7

## 2011-09-21 LAB — T-HELPER CELL (CD4) - (RCID CLINIC ONLY)
CD4 % Helper T Cell: 53 % (ref 33–55)
CD4 T Cell Abs: 770 uL (ref 400–2700)

## 2011-09-22 ENCOUNTER — Telehealth: Payer: Self-pay | Admitting: Hematology & Oncology

## 2011-09-22 NOTE — Telephone Encounter (Signed)
Left messages at all contact numbers for pt to call and schedule appt. Pt does not have voice mail on his phone

## 2011-09-23 ENCOUNTER — Telehealth: Payer: Self-pay | Admitting: *Deleted

## 2011-09-23 ENCOUNTER — Telehealth: Payer: Self-pay | Admitting: Hematology & Oncology

## 2011-09-23 NOTE — Telephone Encounter (Signed)
Dr. Myna Hidalgo from oncology called, pt does not want to be seen there, he says it is too far. He lives in [place], can a new referral be made closer to patient's home. Sending to Dr. Drue Second and Feliz Beam, CMA Wendall Mola CMA

## 2011-09-23 NOTE — Telephone Encounter (Signed)
Received referral from Dr. Feliz Beam office. Pt doesn't want to come here said he lives in [place]. Annice Pih from referring [phone]) is aware and will let MD know.

## 2011-09-23 NOTE — Telephone Encounter (Signed)
Left message with mother and pt's cell phone that it was important for him to contact me to make an appointment

## 2011-10-04 NOTE — Progress Notes (Signed)
HIV CLINIC VISIT  RFV: routine follow up Subjective:    Patient ID: Nathan Wood, male    DOB: 10/19/1955, 56 y.o.   MRN: [id number]  HPI Nathan Wood is a 56 yo Male with HIV, CD 4 count of 710(44%)/VL 1601 in April , ART naive. The patient is still processing his HIV diagnosis. He attends men's support group but still doesn't feel comfortable with disclosure. He still has not told his family. On his last visit, he was considerably anxioius and states that his meds help him. Patient has numerous quetsions about HIV and is considering starting treatment.  Current Outpatient Prescriptions on File Prior to Visit  Medication Sig Dispense Refill  . famotidine (PEPCID) 20 MG tablet Take 1 tablet (20 mg total) by mouth 2 (two) times daily.  30 tablet  0  . fish oil-omega-3 fatty acids 1000 MG capsule Take 2 g by mouth daily.      Marland Kitchen loratadine (CLARITIN) 10 MG tablet Take 10 mg by mouth daily.        . Multiple Vitamin (MULTIVITAMIN) capsule Take 1 capsule by mouth daily.      . sertraline (ZOLOFT) 50 MG tablet Take 1 tablet (50 mg total) by mouth daily.  30 tablet  5  . valACYclovir (VALTREX) 1000 MG tablet Take 1 tablet (1,000 mg total) by mouth 3 (three) times daily.  30 tablet  0  . vitamin B-12 (CYANOCOBALAMIN) 100 MCG tablet Take 100 mcg by mouth daily.      . traZODone (DESYREL) 50 MG tablet Take 1 tablet (50 mg total) by mouth at bedtime.  30 tablet  5   Active Ambulatory Problems    Diagnosis Date Noted  . Abdominal pain, acute, epigastric 02/04/2011  . Hyperproteinemia 02/04/2011  . Proteinuria 02/04/2011  . Lymphadenopathy 02/04/2011  . Anemia 02/04/2011  . Rash and nonspecific skin eruption 02/04/2011  . HIV (human immunodeficiency virus infection) 02/10/2011  . Acute pancreatitis 02/10/2011  . Hypotension 04/21/2011  . Dizziness 04/21/2011  . Depression, major, single episode, moderate 07/01/2011   Resolved Ambulatory Problems    Diagnosis Date Noted  . No Resolved Ambulatory  Problems   Past Medical History  Diagnosis Date  . Human immunodeficiency virus   . Anxiety   . Depression        Review of Systems 10 point reos is otherwise negative.    Objective:   Physical Exam BP 127/79  Pulse 174  Temp 98.2 F (36.8 C) (Oral)  Wt 204 lb (92.534 kg) Physical Exam  Constitutional: He is oriented to person, place, and time. He appears frail appearingNo distress.  HENT:  Mouth/Throat: Oropharynx is clear and moist. No oropharyngeal exudate.  Cardiovascular: Normal rate, regular rhythm and normal heart sounds. Exam reveals no gallop and no friction rub.  No murmur heard.  Pulmonary/Chest: Effort normal and breath sounds normal. No respiratory distress. He has no wheezes.  Abdominal: Soft. Bowel sounds are normal. He exhibits no distension. There is no tenderness.  Lymphadenopathy:  He has no cervical adenopathy.  Neurological: He is alert and oriented to person, place, and time.  Skin: Skin is warm and dry. No rash noted. No erythema.  Psychiatric: He has a normal mood and affect. His behavior is normal.          Assessment & Plan:  HIV = will repeat labs in the next month to decide he would like to start therapy  Protein gap/ proteni in the urine by spep/upep, the patient has monoclonal  IgG kappa protein. Will refer to oncology or ask them how we should proceed with work-up.

## 2011-10-17 ENCOUNTER — Ambulatory Visit: Payer: Self-pay | Admitting: Internal Medicine

## 2011-10-24 ENCOUNTER — Telehealth: Payer: Self-pay | Admitting: *Deleted

## 2011-10-24 ENCOUNTER — Ambulatory Visit: Payer: Self-pay | Admitting: Internal Medicine

## 2011-10-24 NOTE — Telephone Encounter (Signed)
Called patient and rescheduled his appt for 11/10/11, he no showed today. Nathan Wood

## 2011-11-08 ENCOUNTER — Ambulatory Visit (INDEPENDENT_AMBULATORY_CARE_PROVIDER_SITE_OTHER): Payer: 59 | Admitting: Internal Medicine

## 2011-11-08 ENCOUNTER — Encounter: Payer: Self-pay | Admitting: Internal Medicine

## 2011-11-08 VITALS — BP 113/67 | HR 80 | Temp 98.2°F | Wt 206.0 lb

## 2011-11-08 DIAGNOSIS — R778 Other specified abnormalities of plasma proteins: Secondary | ICD-10-CM

## 2011-11-08 DIAGNOSIS — R799 Abnormal finding of blood chemistry, unspecified: Secondary | ICD-10-CM

## 2011-11-08 NOTE — Progress Notes (Signed)
HIV CLINICVISIT  RFV: routine follow up Subjective:    Patient ID: Nathan Wood, male    DOB: 06/18/1955, 56 y.o.   MRN: [id number]  HPI CD 4 count of 770/ VL 526, no on HAART presently. He is no longer attending behaviorial health counseling, goes to [facility] ground groups twice a week. Offered him to see if he would like to see counselor at clinic, for which he is thinking about it. He is doing well, living with his mom in [place]. He is gaining weight, considering going on diet due to weight gain. Feeling good. Continues to work in [employer]. He says that his insomnia is improved. Mood is better with zoloft.  Current Outpatient Prescriptions on File Prior to Visit  Medication Sig Dispense Refill  . famotidine (PEPCID) 20 MG tablet Take 1 tablet (20 mg total) by mouth 2 (two) times daily.  30 tablet  0  . fish oil-omega-3 fatty acids 1000 MG capsule Take 2 g by mouth daily.      Marland Kitchen loratadine (CLARITIN) 10 MG tablet Take 10 mg by mouth daily.        . Multiple Vitamin (MULTIVITAMIN) capsule Take 1 capsule by mouth daily.      . sertraline (ZOLOFT) 50 MG tablet Take 1 tablet (50 mg total) by mouth daily.  30 tablet  5  . valACYclovir (VALTREX) 1000 MG tablet Take 1 tablet (1,000 mg total) by mouth 3 (three) times daily.  30 tablet  0  . vitamin B-12 (CYANOCOBALAMIN) 100 MCG tablet Take 100 mcg by mouth daily.      . traZODone (DESYREL) 50 MG tablet Take 1 tablet (50 mg total) by mouth at bedtime.  30 tablet  5   Active Ambulatory Problems    Diagnosis Date Noted  . Abdominal pain, acute, epigastric 02/04/2011  . Hyperproteinemia 02/04/2011  . Proteinuria 02/04/2011  . Lymphadenopathy 02/04/2011  . Anemia 02/04/2011  . Rash and nonspecific skin eruption 02/04/2011  . HIV (human immunodeficiency virus infection) 02/10/2011  . Acute pancreatitis 02/10/2011  . Hypotension 04/21/2011  . Dizziness 04/21/2011  . Depression, major, single episode, moderate 07/01/2011   Resolved Ambulatory  Problems    Diagnosis Date Noted  . No Resolved Ambulatory Problems   Past Medical History  Diagnosis Date  . Human immunodeficiency virus   . Anxiety   . Depression      Review of Systems 10 point ROS negative    Objective:   Physical Exam BP 113/67  Pulse 80  Temp 98.2 F (36.8 C) (Oral)  Wt 206 lb (93.441 kg) Physical Exam  Constitutional: He is oriented to person, place, and time. He appears well-developed and well-nourished. No distress.  HENT:  Mouth/Throat: Oropharynx is clear and moist. No oropharyngeal exudate.  Cardiovascular: Normal rate, regular rhythm and normal heart sounds. Exam reveals no gallop and no friction rub.  No murmur heard.  Pulmonary/Chest: Effort normal and breath sounds normal. No respiratory distress. He has no wheezes.  Abdominal: Soft. Bowel sounds are normal. He exhibits no distension. There is no tenderness.  Lymphadenopathy:  He has no cervical adenopathy.  Neurological: He is alert and oriented to person, place, and time.  Skin: Skin is warm and dry. No rash noted. No erythema.  Psychiatric: He has a normal mood and affect. His behavior is normal.         Assessment & Plan:  Abn spep = he has isolated elevated gamma globulin. will get IFE, refer to heme/onc if IFE is  abn.  hiv = continue off of HAART for now until ready to start tx  Depression/anxiety = continue with zoloft  Insomnia= continue to use trazodone PRN  rtc in 3-4 months, will do labs at this visit/  1 month for flu vax/

## 2011-11-10 ENCOUNTER — Ambulatory Visit: Payer: Self-pay | Admitting: Internal Medicine

## 2011-11-10 LAB — IMMUNOFIXATION ELECTROPHORESIS
IgA: 413 mg/dL — ABNORMAL HIGH (ref 68–379)
IgG (Immunoglobin G), Serum: 3310 mg/dL — ABNORMAL HIGH (ref 650–1600)

## 2011-11-14 ENCOUNTER — Ambulatory Visit: Payer: Self-pay | Admitting: Internal Medicine

## 2011-11-15 ENCOUNTER — Ambulatory Visit: Payer: Self-pay | Admitting: Internal Medicine

## 2011-12-14 NOTE — Progress Notes (Signed)
HIV CLINIC NOTE   RFV: routine Subjective:    Patient ID: Nathan Wood, male    DOB: [birth year], 56 y.o.   MRN: [id number]  HPI Axiel is a pleasant 56yo Male with HIV, CD 4 count of 710/VL 1601, currently not on ART. Still processing his diagnosis of HIV. He sttes that it is still difficult undersanding how he got HIV, fear of what we do next. HE states that his mood is still somewhat down, no plan to hurt himself. and he has difficulty sleeping most nights.  Current Outpatient Prescriptions on File Prior to Visit  Medication Sig Dispense Refill  . famotidine (PEPCID) 20 MG tablet Take 1 tablet (20 mg total) by mouth 2 (two) times daily.  30 tablet  0  . fish oil-omega-3 fatty acids 1000 MG capsule Take 2 g by mouth daily.      Marland Kitchen loratadine (CLARITIN) 10 MG tablet Take 10 mg by mouth daily.        . Multiple Vitamin (MULTIVITAMIN) capsule Take 1 capsule by mouth daily.      . valACYclovir (VALTREX) 1000 MG tablet Take 1 tablet (1,000 mg total) by mouth 3 (three) times daily.  30 tablet  0  . vitamin B-12 (CYANOCOBALAMIN) 100 MCG tablet Take 100 mcg by mouth daily.      . sertraline (ZOLOFT) 50 MG tablet Take 1 tablet (50 mg total) by mouth daily.  30 tablet  5  . traZODone (DESYREL) 50 MG tablet Take 1 tablet (50 mg total) by mouth at bedtime.  30 tablet  5   Active Ambulatory Problems    Diagnosis Date Noted  . Abdominal pain, acute, epigastric 02/04/2011  . Hyperproteinemia 02/04/2011  . Proteinuria 02/04/2011  . Lymphadenopathy 02/04/2011  . Anemia 02/04/2011  . Rash and nonspecific skin eruption 02/04/2011  . HIV (human immunodeficiency virus infection) 02/10/2011  . Acute pancreatitis 02/10/2011  . Hypotension 04/21/2011  . Dizziness 04/21/2011  . Depression, major, single episode, moderate 07/01/2011   Resolved Ambulatory Problems    Diagnosis Date Noted  . No Resolved Ambulatory Problems   Past Medical History  Diagnosis Date  . Human immunodeficiency virus   .  Anxiety   . Depression      Review of Systemspositive pertinents listed in hpi    Objective:   Physical Exam BP 126/72  Pulse 69  Temp 98.4 F (36.9 C) (Oral)  Ht 6\' 1"  (1.854 m)  Wt 201 lb (91.173 kg)  BMI 26.52 kg/m2 Physical Exam  Constitutional: He is oriented to person, place, and time. He appears well-developed and well-nourished. No distress.  HENT:  Mouth/Throat: Oropharynx is clear and moist. No oropharyngeal exudate.  Cardiovascular: Normal rate, regular rhythm and normal heart sounds. Exam reveals no gallop and no friction rub.  No murmur heard.  Pulmonary/Chest: Effort normal and breath sounds normal. No respiratory distress. He has no wheezes.  Abdominal: Soft. Bowel sounds are normal. He exhibits no distension. There is no tenderness.  Lymphadenopathy:  He has no cervical adenopathy.  Neurological: He is alert and oriented to person, place, and time.  Skin: Skin is warm and dry. No rash noted. No erythema.  Psychiatric: decreased mood         Assessment & Plan:  HIV = still not ready to start treatment which he can wait since CD 4 count at 700s.  Depression = will start sertraline  Elevated serum protein = will check spep and upep  Insomnia = can likely be related to  depression, but will give small doses of trrazodone to see if that helps him.

## 2012-01-24 ENCOUNTER — Other Ambulatory Visit: Payer: 59

## 2012-01-24 DIAGNOSIS — B20 Human immunodeficiency virus [HIV] disease: Secondary | ICD-10-CM

## 2012-01-25 ENCOUNTER — Telehealth: Payer: Self-pay | Admitting: *Deleted

## 2012-01-25 LAB — COMPREHENSIVE METABOLIC PANEL
ALT: 15 U/L (ref 0–53)
AST: 26 U/L (ref 0–37)
Alkaline Phosphatase: 64 U/L (ref 39–117)
Calcium: 9 mg/dL (ref 8.4–10.5)
Chloride: 105 mEq/L (ref 96–112)
Creat: 1.12 mg/dL (ref 0.50–1.35)
Potassium: 3.8 mEq/L (ref 3.5–5.3)

## 2012-01-25 LAB — CBC WITH DIFFERENTIAL/PLATELET
Eosinophils Absolute: 0.1 10*3/uL (ref 0.0–0.7)
Eosinophils Relative: 3 % (ref 0–5)
Lymphs Abs: 1.4 10*3/uL (ref 0.7–4.0)
MCH: 31.8 pg (ref 26.0–34.0)
MCV: 96 fL (ref 78.0–100.0)
Platelets: 267 10*3/uL (ref 150–400)
RBC: 3.46 MIL/uL — ABNORMAL LOW (ref 4.22–5.81)

## 2012-01-25 LAB — T-HELPER CELL (CD4) - (RCID CLINIC ONLY)
CD4 % Helper T Cell: 48 % (ref 33–55)
CD4 T Cell Abs: 650 uL (ref 400–2700)

## 2012-01-25 NOTE — Telephone Encounter (Signed)
[facility] labs called to give a critical result on this patient. Advised his Total Protein is 10.5. Advised will inform the provider.

## 2012-02-03 ENCOUNTER — Other Ambulatory Visit: Payer: Self-pay | Admitting: Internal Medicine

## 2012-02-03 NOTE — Progress Notes (Signed)
Patient called to let me know that he is starting to have pain associated lip, gums thought to be related to tooth. Possible tooth abscess; will call in rx for amox 500 TID plus vicodin. If not improved over the next 2 days to see [place] in clinic on Monday or if progressively worsening, would recommend to see urgent care.

## 2012-02-07 ENCOUNTER — Encounter: Payer: Self-pay | Admitting: Internal Medicine

## 2012-02-07 ENCOUNTER — Ambulatory Visit (INDEPENDENT_AMBULATORY_CARE_PROVIDER_SITE_OTHER): Payer: 59 | Admitting: Internal Medicine

## 2012-02-07 ENCOUNTER — Other Ambulatory Visit: Payer: Self-pay | Admitting: Internal Medicine

## 2012-02-07 VITALS — BP 113/73 | HR 70 | Temp 98.1°F | Ht 73.0 in | Wt 207.5 lb

## 2012-02-07 DIAGNOSIS — Z21 Asymptomatic human immunodeficiency virus [HIV] infection status: Secondary | ICD-10-CM

## 2012-02-07 DIAGNOSIS — B20 Human immunodeficiency virus [HIV] disease: Secondary | ICD-10-CM

## 2012-02-07 DIAGNOSIS — K047 Periapical abscess without sinus: Secondary | ICD-10-CM

## 2012-02-07 DIAGNOSIS — Z23 Encounter for immunization: Secondary | ICD-10-CM

## 2012-02-07 MED ORDER — ELVITEG-COBIC-EMTRICIT-TENOFDF 150-150-200-300 MG PO TABS: 1.0000 | ORAL_TABLET | Freq: Every day | ORAL | Status: DC

## 2012-02-07 NOTE — Progress Notes (Signed)
HIV CLINIC NOTE  RFV: routine visit but also follow up to recent tooth infection Subjective:    Patient ID: Nathan Wood, male    DOB: [birth year], 56 y.o.   MRN: [id number]  HPI HIV, CD 4 count of 650/ VL 2,300. HAART naive. He recently had increasing pain and swelling to gum line noted on thanksgiving evening. He was started on amoxicillin and also given pain medications which he reports both helping. He denies fever, chills, nightsweats, no more swelling of gums and lower lip. No diarrhea. Constipation. Rash. Sore throat.  Current Outpatient Prescriptions on File Prior to Visit  Medication Sig Dispense Refill  . fish oil-omega-3 fatty acids 1000 MG capsule Take 2 g by mouth daily.      Marland Kitchen loratadine (CLARITIN) 10 MG tablet Take 10 mg by mouth daily.        . sertraline (ZOLOFT) 50 MG tablet Take 1 tablet (50 mg total) by mouth daily.  30 tablet  5  . valACYclovir (VALTREX) 1000 MG tablet Take 1 tablet (1,000 mg total) by mouth 3 (three) times daily.  30 tablet  0  . vitamin B-12 (CYANOCOBALAMIN) 100 MCG tablet Take 100 mcg by mouth daily.      . Multiple Vitamin (MULTIVITAMIN) capsule Take 1 capsule by mouth daily.      . traZODone (DESYREL) 50 MG tablet Take 1 tablet (50 mg total) by mouth at bedtime.  30 tablet  5   Active Ambulatory Problems    Diagnosis Date Noted  . Abdominal pain, acute, epigastric 02/04/2011  . Hyperproteinemia 02/04/2011  . Proteinuria 02/04/2011  . Lymphadenopathy 02/04/2011  . Anemia 02/04/2011  . Rash and nonspecific skin eruption 02/04/2011  . HIV (human immunodeficiency virus infection) 02/10/2011  . Acute pancreatitis 02/10/2011  . Hypotension 04/21/2011  . Dizziness 04/21/2011  . Depression, major, single episode, moderate 07/01/2011   Resolved Ambulatory Problems    Diagnosis Date Noted  . No Resolved Ambulatory Problems   Past Medical History  Diagnosis Date  . Human immunodeficiency virus   . Anxiety   . Depression        Review of  Systems     Objective:   Physical Exam BP 113/73  Pulse 70  Temp 98.1 F (36.7 C) (Oral)  Ht 6\' 1"  (1.854 m)  Wt 207 lb 8 oz (94.121 kg)  BMI 27.38 kg/m2  Physical Exam  Constitutional: He is oriented to person, place, and time. He appears well-developed and well-nourished. No distress.  HENT:  Lymphadenopathy: numerous bulky submandibular lymphadenopathy bilaterally, in addition to +cervical lymph nodes, shotty R>L Mouth/Throat: Oropharynx is clear and moist. No oropharyngeal exudate. Poor dentition, enflamed gum line, periodontal disease on lower front teeth Cardiovascular: Normal rate, regular rhythm and normal heart sounds. Exam reveals no gallop and no friction rub.  No murmur heard.  Pulmonary/Chest: Effort normal and breath sounds normal. No respiratory distress. He has no wheezes.  Abdominal: Soft. Bowel sounds are normal. He exhibits no distension. There is no tenderness.   Neurological: He is alert and oriented to person, place, and time.  Skin: Skin is warm and dry. No rash noted. No erythema.  Psychiatric: He has a normal mood and affect. His behavior is normal.          Assessment & Plan:  Lymphadenopathy = marked worsened, not correlating to any acute infection such as a uri. Will get neck CT and IR guided biopsy for path and AFB culture. Mainly concerned about malignancy, such as lymphoma,  since he has had elevated protein and abnormal protein electropheresis but has not seen hematology yet.  hiv = patient is motivated to start therapy and is ready to start stribild. Pharmacist discussed option to start treatment, will do stribild 1 tab daily. Discussed importance of adherence. Since his VL>2,000 will get genotype today.  Possible tooth abscess= continue with finishing course of amoxicillin x 10 days (thus far on #4/10). Following up with dentistry.  Depressed mood = he is tearful today in clinic due to seeing the gentleman who he thinks he has acquired HIV from.  Referred to [facility] to help process his emotions. Continue on zoloft, may need to increase dose. Await for assessment with Bernette Redbird, counselor.   rtc in 1 month or sooner if symptoms worsen

## 2012-02-09 LAB — HIV-1 RNA ULTRAQUANT REFLEX TO GENTYP+: HIV-1 RNA Quant, Log: 3.34 {Log} — ABNORMAL HIGH (ref <1.30)

## 2012-02-10 NOTE — Progress Notes (Signed)
HPI: Nathan Wood is a 56 y.o. male with hx HIV who is here to discuss ART therapy  Allergies: No Known Allergies  Vitals:    Past Medical History: Past Medical History  Diagnosis Date  . Human immunodeficiency virus   . Anxiety   . Depression     Social History: History   Social History  . Marital Status: Single    Spouse Name: N/A    Number of Children: N/A  . Years of Education: CNA   Occupational History  . CNA    Social History Main Topics  . Smoking status: Current Every Day Smoker -- 0.5 packs/day for 10 years    Types: Cigarettes  . Smokeless tobacco: None     Comment: encouragement  . Alcohol Use: 1.0 oz/week    2 drink(s) per week     Comment: occassional  . Drug Use: No  . Sexually Active: Not Currently -- Male partner(s)     Comment: pt given condoms   Other Topics Concern  . 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 He is homosexual. Has not had sexual intercourse in the last 6 months.    Home Medications:  (Not in a hospital admission)  Current Regimen: None  Labs: HIV 1 RNA Quant (copies/mL)  Date Value  02/07/2012 2169*  01/24/2012 2296*  09/19/2011 526*     CD4 T Cell Abs (cmm)  Date Value  01/24/2012 650   09/19/2011 770   06/22/2011 710      Hep B S Ab (no units)  Date Value  02/04/2011 POSITIVE*     Hepatitis B Surface Ag (no units)  Date Value  02/04/2011 NEGATIVE      HCV Ab (no units)  Date Value  02/04/2011 NEGATIVE     CrCl: Estimated Creatinine Clearance: 83.2 ml/min (by C-G formula based on Cr of 1.12).  Lipids:    Component Value Date/Time   CHOL 127 06/22/2011 1526   TRIG 166* 06/22/2011 1526   HDL 26* 06/22/2011 1526   CHOLHDL 4.9 06/22/2011 1526   VLDL 33 06/22/2011 1526   LDLCALC 68 06/22/2011 1526    Assessment: 56 yo with HIV who has never been on meds before. His CD4 is good and his VL is 2300. He has always been hesitant  about starting meds. I spent around 20 minutes with discussing HIV and about the benefits of starting therapy early. I discussed all of the options with him including side effects of each regimen. After our discussion, he stated he would like to be on Stribild due to its low risk of side effects. I stressed the importance of compliance with meds or he will lose the ability to use a one pill regimen in the future. He was given a copay card to help pay for Stribild.  Recommendations: Stribild 1 tab qday Send genotype  Clide Cliff, PharmD Clinical Infectious Disease Pharmacist [facility] 02/10/2012, 4:09 PM

## 2012-02-14 ENCOUNTER — Ambulatory Visit: Payer: Self-pay

## 2012-02-15 LAB — HIV-1 GENOTYPR PLUS

## 2012-02-17 ENCOUNTER — Telehealth: Payer: Self-pay | Admitting: Licensed Clinical Social Worker

## 2012-02-17 NOTE — Telephone Encounter (Signed)
Tiffany called touch base on this patient needing a ct before having his biopsy, the Radiologist talked to Dr. Drue Second about this per Tiffany. Tiffany was wanting to know if Dr. Drue Second still wants the biopsy, because the patient has not had a ct yet and the order is not in the computer.

## 2012-02-20 NOTE — Telephone Encounter (Signed)
CT biopsy is on hold until I see him next. i will discontinue order

## 2012-02-23 ENCOUNTER — Telehealth: Payer: Self-pay | Admitting: Internal Medicine

## 2012-02-23 ENCOUNTER — Other Ambulatory Visit: Payer: Self-pay | Admitting: *Deleted

## 2012-02-23 DIAGNOSIS — B20 Human immunodeficiency virus [HIV] disease: Secondary | ICD-10-CM

## 2012-02-23 MED ORDER — ELVITEG-COBIC-EMTRICIT-TENOFDF 150-150-200-300 MG PO TABS: 1.0000 | ORAL_TABLET | Freq: Every day | ORAL | Status: DC

## 2012-02-23 NOTE — Telephone Encounter (Signed)
Faxed Nathan Wood's application to [employer] today.

## 2012-03-05 ENCOUNTER — Telehealth: Payer: Self-pay | Admitting: Internal Medicine

## 2012-03-05 NOTE — Telephone Encounter (Signed)
Sent fax with letter of appeal to [facility] [employer] today.

## 2012-03-05 NOTE — Telephone Encounter (Signed)
Called [employer] to check status on Nathan Wood's application.  It is being reviewed now for his insurance coverage.  They should know his status this afternoon or tomorrow morning.  They will inform me as soon as the decision is made.

## 2012-03-09 ENCOUNTER — Other Ambulatory Visit: Payer: Self-pay | Admitting: Internal Medicine

## 2012-03-09 MED ORDER — OSELTAMIVIR PHOSPHATE 75 MG PO CAPS: 75.0000 mg | ORAL_CAPSULE | Freq: Two times a day (BID) | ORAL | Status: DC

## 2012-03-09 NOTE — Progress Notes (Signed)
Patient called in stating he has a fever to 102, myalgia, and starting to cough. Concern for influenza. Will empirically treat

## 2012-03-12 ENCOUNTER — Encounter: Payer: Self-pay | Admitting: *Deleted

## 2012-03-13 ENCOUNTER — Ambulatory Visit (INDEPENDENT_AMBULATORY_CARE_PROVIDER_SITE_OTHER): Payer: 59 | Admitting: Internal Medicine

## 2012-03-13 ENCOUNTER — Encounter: Payer: Self-pay | Admitting: Internal Medicine

## 2012-03-13 VITALS — BP 122/75 | HR 54 | Temp 97.7°F | Ht 73.0 in | Wt 203.0 lb

## 2012-03-13 DIAGNOSIS — B20 Human immunodeficiency virus [HIV] disease: Secondary | ICD-10-CM

## 2012-03-13 DIAGNOSIS — Z21 Asymptomatic human immunodeficiency virus [HIV] infection status: Secondary | ICD-10-CM

## 2012-03-13 DIAGNOSIS — F329 Major depressive disorder, single episode, unspecified: Secondary | ICD-10-CM

## 2012-03-13 DIAGNOSIS — F3289 Other specified depressive episodes: Secondary | ICD-10-CM

## 2012-03-13 DIAGNOSIS — J111 Influenza due to unidentified influenza virus with other respiratory manifestations: Secondary | ICD-10-CM

## 2012-03-13 DIAGNOSIS — R599 Enlarged lymph nodes, unspecified: Secondary | ICD-10-CM

## 2012-03-13 NOTE — Progress Notes (Signed)
HIV CLINIC NOTE  RFV: follow up on ILI and starting ART  Subjective:    Patient ID: Nathan Wood, male    DOB: 12-Jul-1955, 57 y.o.   MRN: [id number]  HPI HIV diseaseCD 4 count 650/ VL 2,164, genotype negative. Had flu like symptoms x 1 day but then improved. Did not fill oseltamavir. Doing better now. His lymph nodes are decreased today not causing any discomfort  Still having difficulty getting low cost ART. Applied for PAP for [facility] for stribild. Had a person call his house the othernight, approval is still pending.   Current Outpatient Prescriptions on File Prior to Visit  Medication Sig Dispense Refill  . amoxicillin (AMOXIL) 500 MG capsule       . fish oil-omega-3 fatty acids 1000 MG capsule Take 2 g by mouth daily.      . Hydrocodone-Acetaminophen 5-300 MG TABS       . loratadine (CLARITIN) 10 MG tablet Take 10 mg by mouth daily.        . Multiple Vitamin (MULTIVITAMIN) capsule Take 1 capsule by mouth daily.      . sertraline (ZOLOFT) 50 MG tablet Take 1 tablet (50 mg total) by mouth daily.  30 tablet  5  . traZODone (DESYREL) 50 MG tablet Take 1 tablet (50 mg total) by mouth at bedtime.  30 tablet  5  . vitamin B-12 (CYANOCOBALAMIN) 100 MCG tablet Take 100 mcg by mouth daily.      Marland Kitchen elvitegravir-cobicistat-emtricitabine-tenofovir (STRIBILD) 150-150-200-300 MG TABS Take 1 tablet by mouth daily with breakfast.  30 tablet  11  . oseltamivir (TAMIFLU) 75 MG capsule Take 1 capsule (75 mg total) by mouth 2 (two) times daily.  10 capsule  0       Review of Systems     Objective:   Physical Exam BP 122/75  Pulse 54  Temp 97.7 F (36.5 C) (Oral)  Ht 6\' 1"  (1.854 m)  Wt 203 lb (92.08 kg)  BMI 26.78 kg/m2 gen = a x o by 3 in NAD HEENT = LAD submandibular, shotty non tender, mobile subcm in size .bilateral OP= clear, no erythema, no thrush Pulm= CTAB Cors= nl s1,s2       Assessment & Plan:  HIV= trying to figure out how to get ART at low cost for the patient. The plan  is to wait and hear back from [facility] to see if patient has been accepted to PAP. If not, we will need to determine another method to getting onto meds.  Lymphadenopathy= since now improved, thought to be reactive. Will continue to follow. Will not pursue biopsy at this time.  Flu like illness= resolved spontaneously, will ask him not to take oseltamavir  Depression = still recommend that he speaks with [facility] today  rtc in 4-6wk

## 2012-03-19 ENCOUNTER — Telehealth: Payer: Self-pay | Admitting: Internal Medicine

## 2012-03-19 NOTE — Telephone Encounter (Signed)
Received a fax from [employer] stating Nathan Wood has been approved for his Atriple  03-16-12 - 03-16-13.  I called and left a message telling him he has been approved and to call me if he has any questions.

## 2012-03-20 NOTE — Progress Notes (Signed)
HPI: Nathan Wood is a 57 y.o. male who is here to f/u for his HIV meds issue.   Allergies: No Known Allergies  Vitals:    Past Medical History: Past Medical History  Diagnosis Date  . Human immunodeficiency virus   . Anxiety   . Depression     Social History: History   Social History  . Marital Status: Single    Spouse Name: N/A    Number of Children: N/A  . Years of Education: CNA   Occupational History  . CNA    Social History Main Topics  . Smoking status: Current Every Day Smoker -- 0.5 packs/day for 10 years    Types: Cigarettes  . Smokeless tobacco: None     Comment: encouragement  . Alcohol Use: 1.0 oz/week    2 drink(s) per week     Comment: occassional  . Drug Use: No  . Sexually Active: Not Currently -- Male partner(s)     Comment: pt given condoms   Other Topics Concern  . 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 He is homosexual. Has not had sexual intercourse in the last 6 months.    Home Medications:  (Not in a hospital admission)  Current Regimen: Stribild 1 tab qday (not on therapy yet)  Labs: HIV 1 RNA Quant (copies/mL)  Date Value  02/07/2012 2169*  01/24/2012 2296*  09/19/2011 526*     CD4 T Cell Abs (cmm)  Date Value  01/24/2012 650   09/19/2011 770   06/22/2011 710      Hep B S Ab (no units)  Date Value  02/04/2011 POSITIVE*     Hepatitis B Surface Ag (no units)  Date Value  02/04/2011 NEGATIVE      HCV Ab (no units)  Date Value  02/04/2011 NEGATIVE     CrCl: Estimated Creatinine Clearance: 83.2 ml/min (by C-G formula based on Cr of 1.12).  Lipids:    Component Value Date/Time   CHOL 127 06/22/2011 1526   TRIG 166* 06/22/2011 1526   HDL 26* 06/22/2011 1526   CHOLHDL 4.9 06/22/2011 1526   VLDL 33 06/22/2011 1526   LDLCALC 68 06/22/2011 1526    Assessment: 57 yo with HIV who was supposed to be on Stribild since his last visit here but he  could get his meds since he couldn't afford the co-pay even with the co-pay assistance program. I called his insurance company to confirm his high copay and see if they could do anything. They couldn't offer any financial assistance. I then called Nathan Wood about his pending appeal process. They said that the application is in progress and should expect a decision within the week. In the meantime, we advised Nathan Wood hold off his therapy until we hear back from [employer].  Recommendations: Hold Stribild for now until assistance program is resolved  Clide Cliff, PharmD Clinical Infectious Disease Pharmacist [facility] 03/20/2012, 12:03 AM

## 2012-03-30 ENCOUNTER — Other Ambulatory Visit: Payer: Self-pay | Admitting: *Deleted

## 2012-03-30 DIAGNOSIS — B20 Human immunodeficiency virus [HIV] disease: Secondary | ICD-10-CM

## 2012-03-30 MED ORDER — ELVITEG-COBIC-EMTRICIT-TENOFDF 150-150-200-300 MG PO TABS: 1.0000 | ORAL_TABLET | Freq: Every day | ORAL | Status: DC

## 2012-05-17 ENCOUNTER — Telehealth: Payer: Self-pay | Admitting: *Deleted

## 2012-05-17 NOTE — Telephone Encounter (Signed)
RN called patient at Dr. Feliz Beam request to check in and see if he received his medication or has any issues at this time.  Patient's cell phone number was not taking calls and his mother answered his home number, stating that he doesn't live there.  RN gave a generic message to patient's mother, asking her to let him know that his doctor wanted to check on him.  Patient's mother said she would pass along the message. Andree Coss, RN

## 2012-05-28 ENCOUNTER — Ambulatory Visit: Payer: Self-pay | Admitting: Internal Medicine

## 2012-06-12 ENCOUNTER — Encounter: Payer: Self-pay | Admitting: *Deleted

## 2012-06-12 ENCOUNTER — Ambulatory Visit: Payer: Self-pay | Admitting: Internal Medicine

## 2012-09-24 ENCOUNTER — Telehealth: Payer: Self-pay | Admitting: *Deleted

## 2012-09-24 NOTE — Telephone Encounter (Signed)
Faxed form requesting additional information from Nathan Wood today to [employer].

## 2012-10-10 ENCOUNTER — Telehealth: Payer: Self-pay | Admitting: *Deleted

## 2012-10-10 NOTE — Telephone Encounter (Signed)
Called patient to try and schedule an appt and had to leave a message for him to call the office.

## 2012-10-11 ENCOUNTER — Telehealth: Payer: Self-pay | Admitting: *Deleted

## 2012-10-11 NOTE — Telephone Encounter (Signed)
Mobile number non-working.

## 2012-11-07 ENCOUNTER — Other Ambulatory Visit: Payer: 59

## 2012-11-07 DIAGNOSIS — Z79899 Other long term (current) drug therapy: Secondary | ICD-10-CM

## 2012-11-07 DIAGNOSIS — B20 Human immunodeficiency virus [HIV] disease: Secondary | ICD-10-CM

## 2012-11-07 DIAGNOSIS — Z113 Encounter for screening for infections with a predominantly sexual mode of transmission: Secondary | ICD-10-CM

## 2012-11-07 LAB — COMPREHENSIVE METABOLIC PANEL
AST: 18 U/L (ref 0–37)
Alkaline Phosphatase: 73 U/L (ref 39–117)
BUN: 15 mg/dL (ref 6–23)
Calcium: 9.2 mg/dL (ref 8.4–10.5)
Chloride: 102 mEq/L (ref 96–112)
Creat: 1.27 mg/dL (ref 0.50–1.35)

## 2012-11-07 LAB — CBC WITH DIFFERENTIAL/PLATELET
Basophils Absolute: 0.1 10*3/uL (ref 0.0–0.1)
Basophils Relative: 1 % (ref 0–1)
Eosinophils Relative: 5 % (ref 0–5)
HCT: 36.4 % — ABNORMAL LOW (ref 39.0–52.0)
MCH: 32.5 pg (ref 26.0–34.0)
MCHC: 33.2 g/dL (ref 30.0–36.0)
MCV: 97.8 fL (ref 78.0–100.0)
Monocytes Absolute: 0.3 10*3/uL (ref 0.1–1.0)
RDW: 12.7 % (ref 11.5–15.5)

## 2012-11-07 LAB — RPR

## 2012-11-07 LAB — LIPID PANEL
HDL: 46 mg/dL (ref >39)
Total CHOL/HDL Ratio: 3.8 Ratio

## 2012-11-20 ENCOUNTER — Ambulatory Visit (INDEPENDENT_AMBULATORY_CARE_PROVIDER_SITE_OTHER): Payer: 59 | Admitting: Internal Medicine

## 2012-11-20 ENCOUNTER — Encounter: Payer: Self-pay | Admitting: Internal Medicine

## 2012-11-20 VITALS — BP 113/72 | HR 67 | Temp 98.0°F | Ht 74.0 in | Wt 200.0 lb

## 2012-11-20 DIAGNOSIS — Z23 Encounter for immunization: Secondary | ICD-10-CM

## 2012-11-20 DIAGNOSIS — G4700 Insomnia, unspecified: Secondary | ICD-10-CM

## 2012-11-20 MED ORDER — ZOLPIDEM TARTRATE 10 MG PO TABS: 10.0000 mg | ORAL_TABLET | Freq: Every evening | ORAL | Status: DC | PRN

## 2012-11-20 NOTE — Progress Notes (Signed)
[facility] HIV CLINIC NOTE  RFV: routine Subjective:    Patient ID: Nathan Wood, male    DOB: 10-23-1955, 57 y.o.   MRN: [id number]  HPI 57yo M with CD 4 count 830/VL 119 on stribild. Reports not missing a dose of his hiv medicaitons. He is working 2nd shift, 3 -11 pm. Shift. Had an episode of waive of nausea, pre-syncopal. He reports not enough oral intake. Reports poor sleep and waking up frequently. Gets home at 1 am, goes to sleep at 2 am. Tries to sleep til 11 am.  Soc hx: Staying in [place], renting an apt.    Current Outpatient Prescriptions on File Prior to Visit  Medication Sig Dispense Refill  . amoxicillin (AMOXIL) 500 MG capsule       . elvitegravir-cobicistat-emtricitabine-tenofovir (STRIBILD) 150-150-200-300 MG TABS Take 1 tablet by mouth daily with breakfast.  30 tablet  11  . fish oil-omega-3 fatty acids 1000 MG capsule Take 2 g by mouth daily.      Marland Kitchen loratadine (CLARITIN) 10 MG tablet Take 10 mg by mouth daily.        . Multiple Vitamin (MULTIVITAMIN) capsule Take 1 capsule by mouth daily.      . sertraline (ZOLOFT) 50 MG tablet Take 1 tablet (50 mg total) by mouth daily.  30 tablet  5  . traZODone (DESYREL) 50 MG tablet Take 1 tablet (50 mg total) by mouth at bedtime.  30 tablet  5  . vitamin B-12 (CYANOCOBALAMIN) 100 MCG tablet Take 100 mcg by mouth daily.      . Hydrocodone-Acetaminophen 5-300 MG TABS        No current facility-administered medications on file prior to visit.    Review of Systems 12 point ros is negative exception for poor sleep    Objective:   Physical Exam BP 113/72  Pulse 67  Temp(Src) 98 F (36.7 C) (Oral)  Ht 6\' 2"  (1.88 m)  Wt 200 lb (90.719 kg)  BMI 25.67 kg/m2 Physical Exam  Constitutional: He is oriented to person, place, and time. He appears well-developed and well-nourished. No distress.  HENT:  Mouth/Throat: Oropharynx is clear and moist. No oropharyngeal exudate.  Cardiovascular: Normal rate, regular rhythm and normal heart  sounds. Exam reveals no gallop and no friction rub.  No murmur heard.  Pulmonary/Chest: Effort normal and breath sounds normal. No respiratory distress. He has no wheezes.  Abdominal: Soft. Bowel sounds are normal. He exhibits no distension. There is no tenderness.  Lymphadenopathy:  He has no cervical adenopathy.  Neurological: He is alert and oriented to person, place, and time.  Skin: Skin is warm and dry. No rash noted. No erythema.  Psychiatric: He has a normal mood and affect. His behavior is normal.        Assessment & Plan:  HIV = continue with stribild. Has low level viremia.   Depression/anxiety = for now zoloft 50 mg ,but may need to increase if insomnia not improved. Will refer to [facility] for [facility]  Insomnia = will do a trial of ambien 5mg   rtc in 3 months

## 2012-11-20 NOTE — Progress Notes (Signed)
  [facility] - Pharmacist    HPI: Nathan Wood is a 57 y.o. male here for follow-up of adherence with Stribild.  Allergies: No Known Allergies  Vitals: Temp: 98 F (36.7 C) (09/16 0959) Temp src: Oral (09/16 0959) BP: 113/72 mmHg (09/16 0959) Pulse Rate: 67 (09/16 0959)  Past Medical History: Past Medical History  Diagnosis Date  . Human immunodeficiency virus   . Anxiety   . Depression     Social History: History   Social History  . Marital Status: Single    Spouse Name: N/A    Number of Children: N/A  . Years of Education: CNA   Occupational History  . CNA    Social History Main Topics  . Smoking status: Current Every Day Smoker -- 0.50 packs/day for 10 years    Types: Cigarettes  . Smokeless tobacco: None     Comment: encouragement  . Alcohol Use: 1.0 oz/week    2 drink(s) per week     Comment: occassional  . Drug Use: No  . Sexual Activity: Not Currently    Partners: Male     Comment: pt given condoms   Other Topics Concern  . 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 He is homosexual. Has not had sexual intercourse in the last 6 months.    Current Regimen: Stribild 1 tab PO qday  Labs: HIV 1 RNA Quant (copies/mL)  Date Value  11/07/2012 119*  02/07/2012 2169*  01/24/2012 2296*     CD4 T Cell Abs (/uL)  Date Value  11/07/2012 830   01/24/2012 650   09/19/2011 770      Hep B S Ab (no units)  Date Value  02/04/2011 POSITIVE*     Hepatitis B Surface Ag (no units)  Date Value  02/04/2011 NEGATIVE      HCV Ab (no units)  Date Value  02/04/2011 NEGATIVE     CrCl: Estimated Creatinine Clearance: 74.6 ml/min (by C-G formula based on Cr of 1.27).  Lipids:    Component Value Date/Time   CHOL 174 11/07/2012 1140   TRIG 119 11/07/2012 1140   HDL 46 11/07/2012 1140   CHOLHDL 3.8 11/07/2012 1140   VLDL 24 11/07/2012 1140   LDLCALC 104* 11/07/2012  1140    Assessment: He reports good adherence with Stribild, with no missed doses in the past 2 weeks.  His viral load is declining but is not yet undetectable.  We also discussed his insomnia.  He is using Trazodone at bedtime without relief.  He requested Xanax, and I counseled him on adverse effects, addictive potential, and potential for worsening of depression.  Recommendations: Would consider a low-dose of Ambien for his insomnia. Adherence counseling performed.  Sallee Provencal, Pharm.D., BCPS, AAHIVP Clinical Infectious Disease Pharmacist [facility] 11/20/2012, 12:04 PM

## 2012-11-24 IMAGING — CR DG CHEST 2V
2 series · 2 of 2 positions shown · non-contrast
Comparison: 02/02/2011

CLINICAL DATA: Chest pain

CHEST - 2 VIEW

[w chest pa]
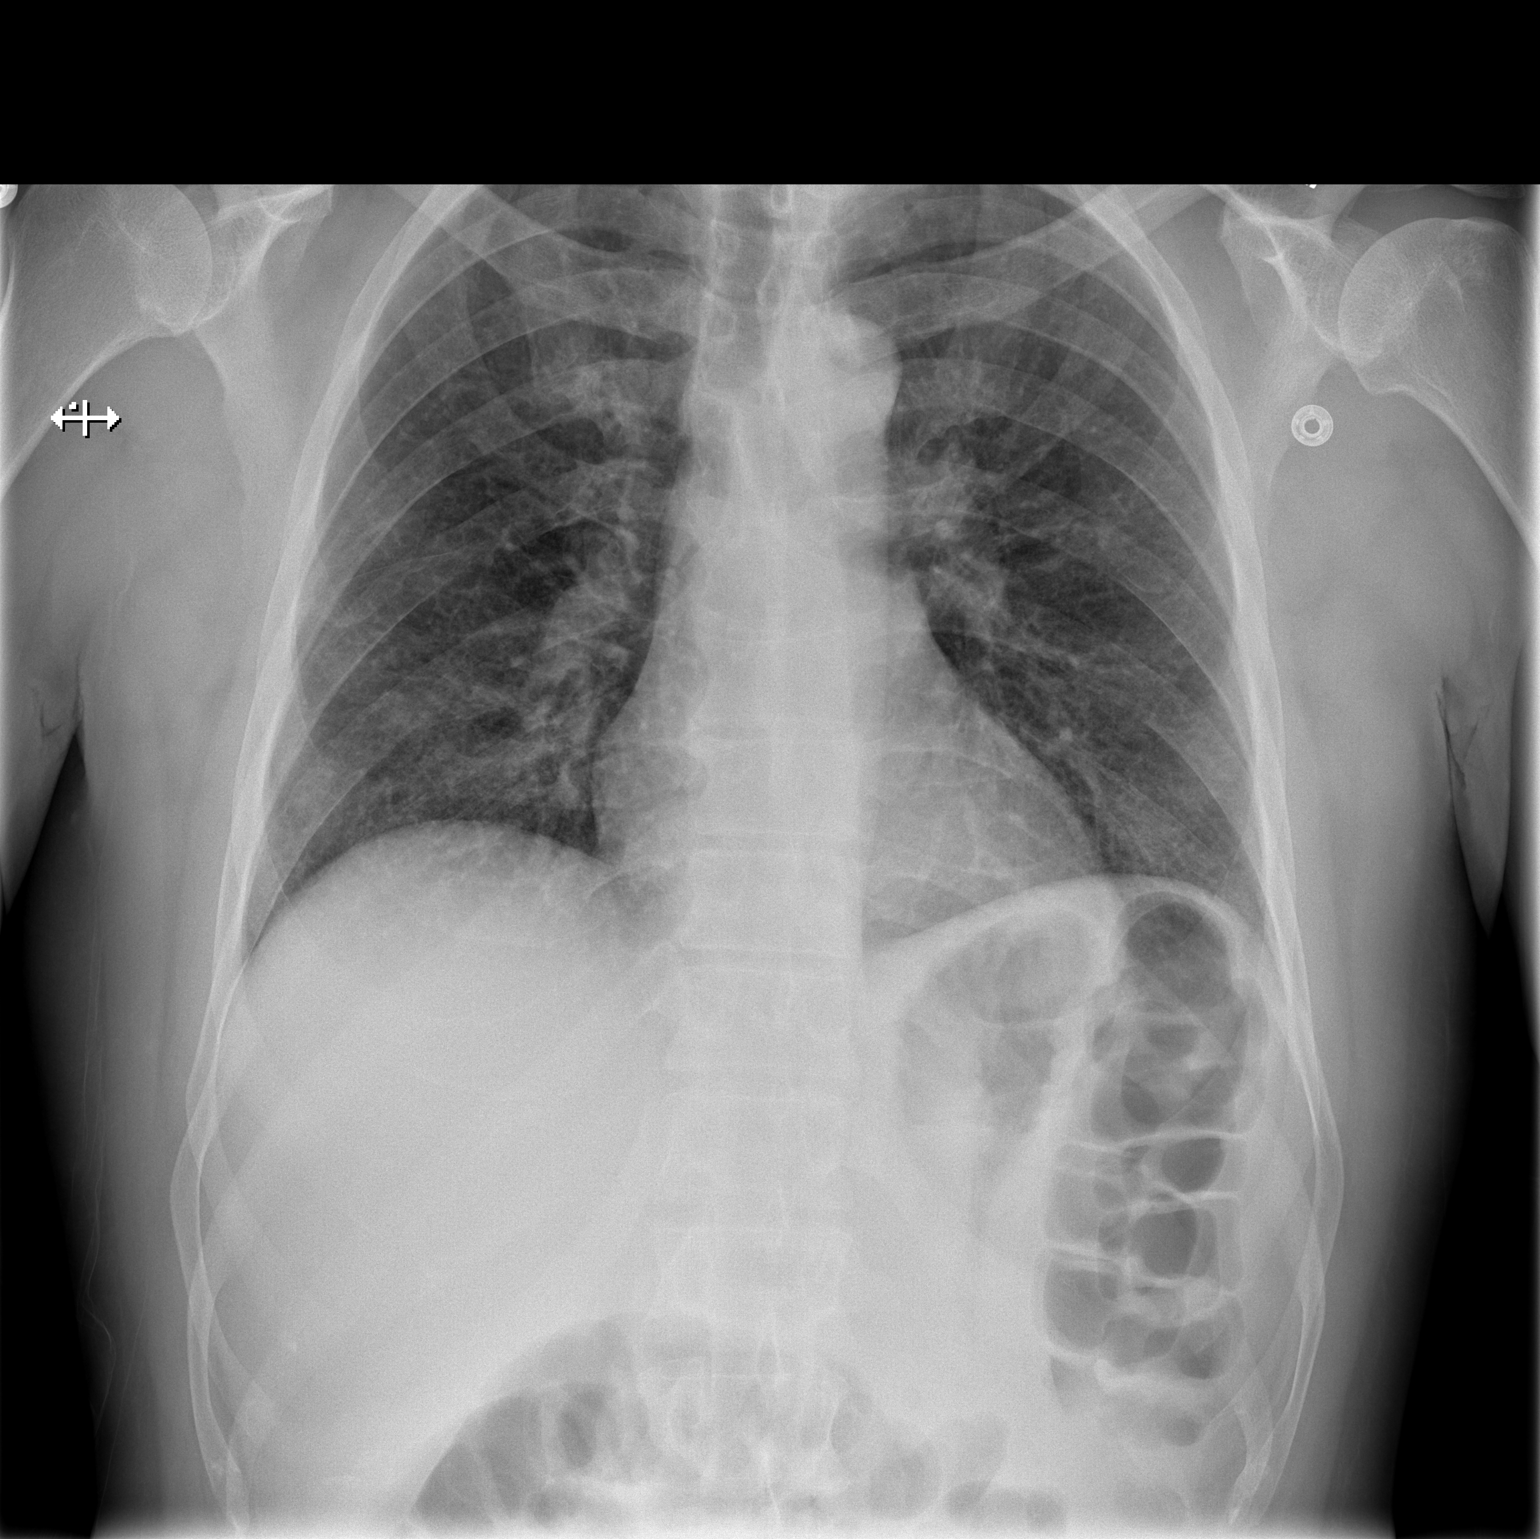

[w chest lat]
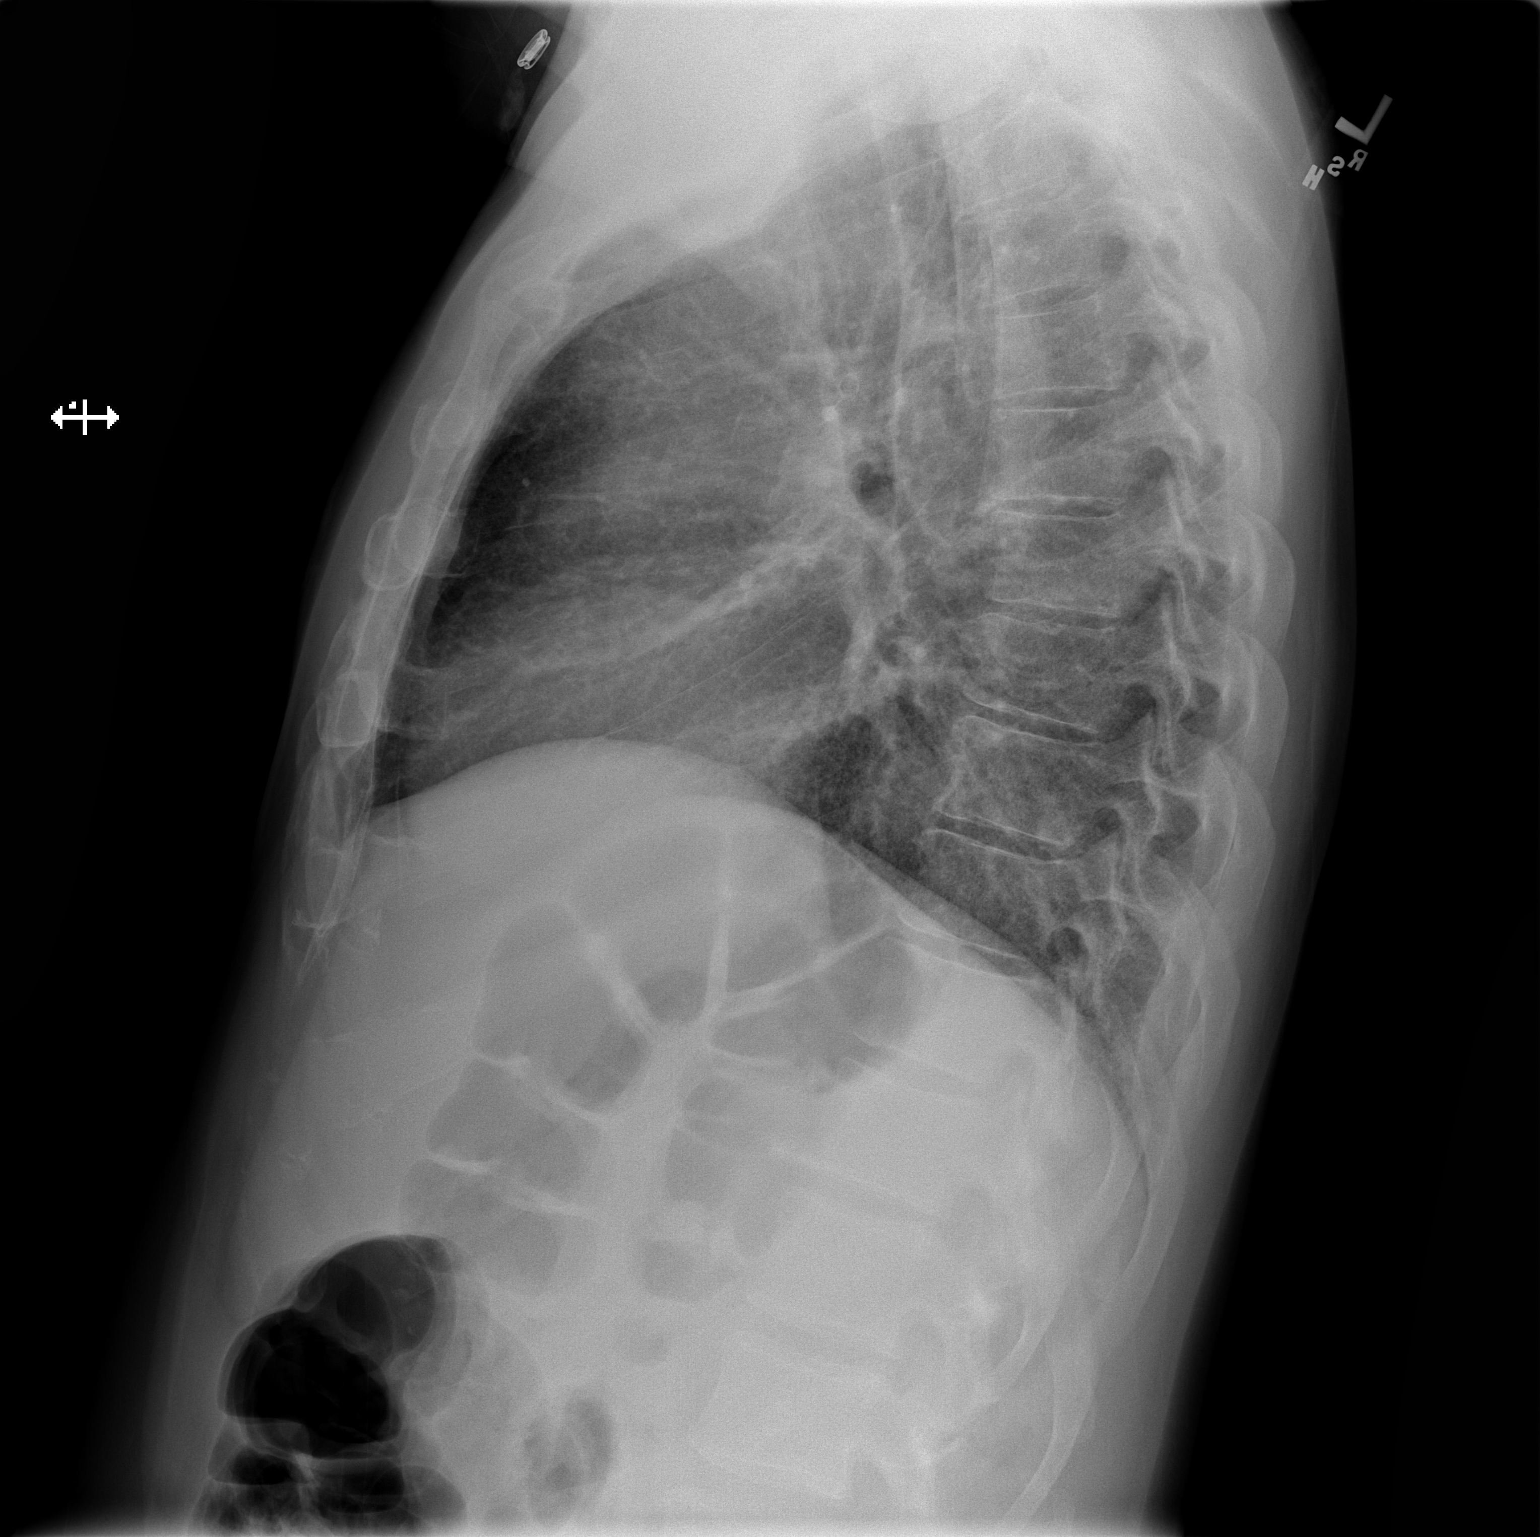

[2 of 2 positions shown; findings below may reference images not displayed]

FINDINGS: Stable bronchitic changes and interstitial prominence.
Normal heart size. Reduced lung volumes.  No pneumothorax or
pleural effusion.
IMPRESSION: Bronchitic changes.

## 2012-11-25 IMAGING — US US SOFT TISSUE HEAD/NECK
1 series · 13 of 25 positions shown · non-contrast
Comparison: Recent neck CT

CLINICAL DATA: Thyroid mass/nodule.

THYROID ULTRASOUND
TECHNIQUE: Ultrasound examination of the thyroid gland and adjacent
soft tissues was performed.

[Series 1: us soft tissue head/neck · 0.08mm/px · 13 of 49 slices shown]
[im 1/49]
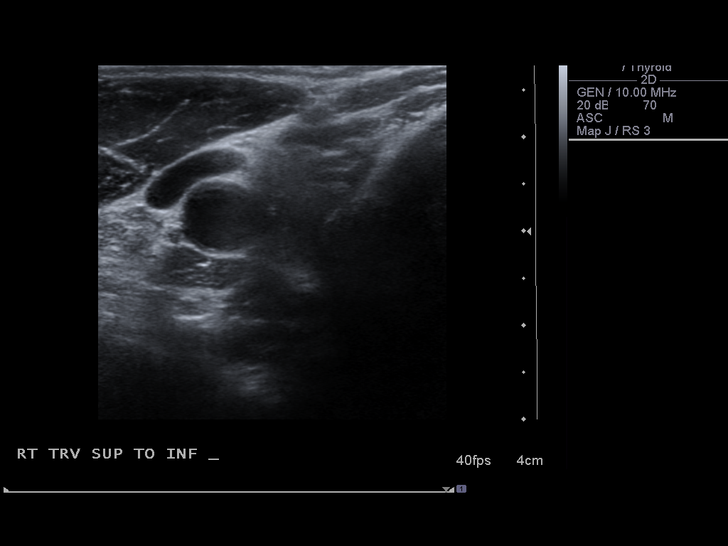
[im 5/49]
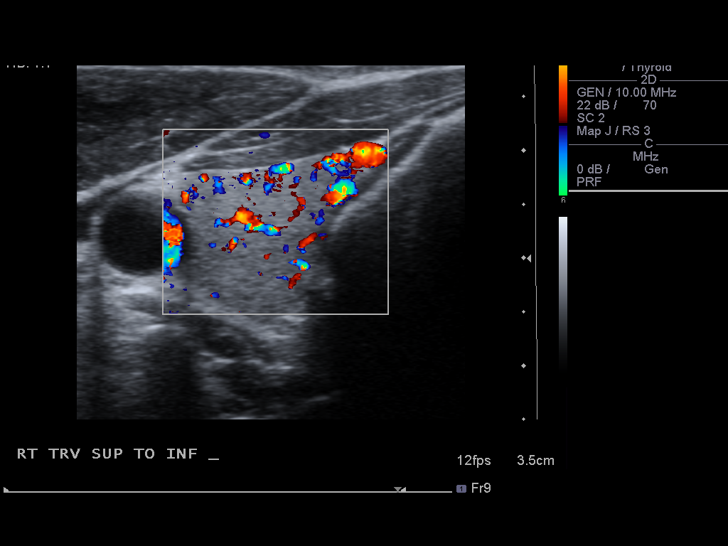
[im 9/49]
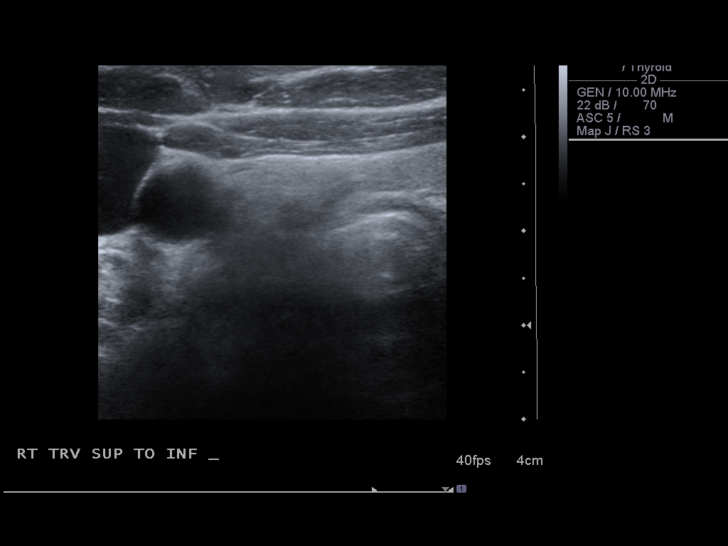
[im 13/49]
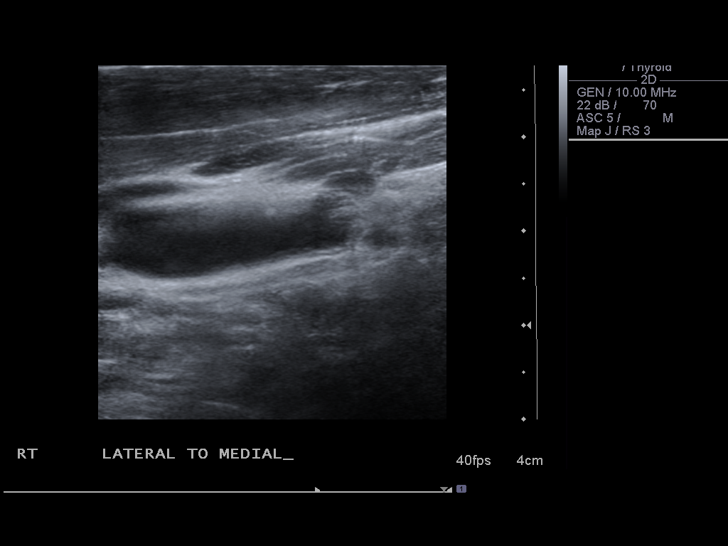
[im 17/49]
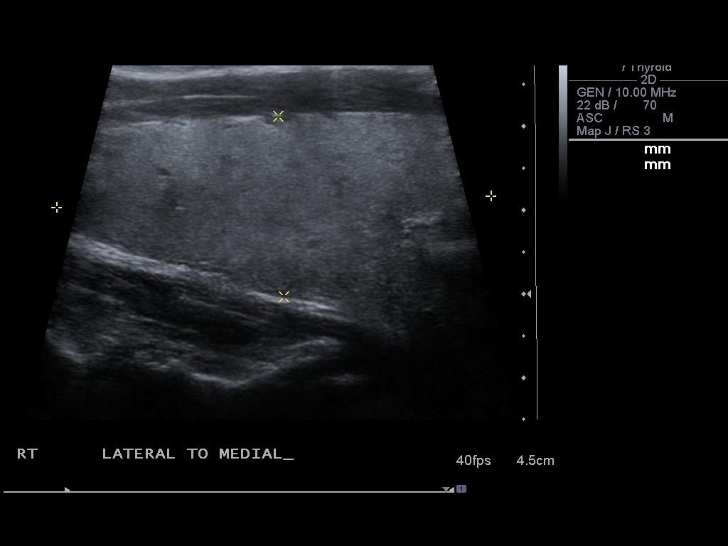
[im 21/49]
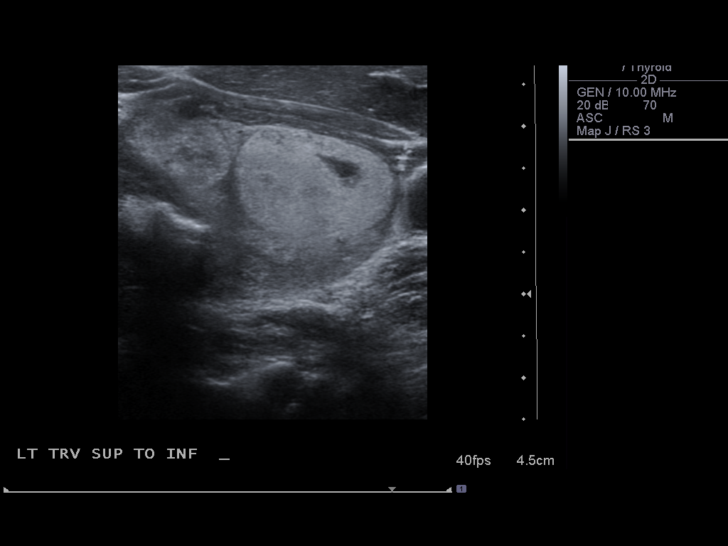
[im 25/49]
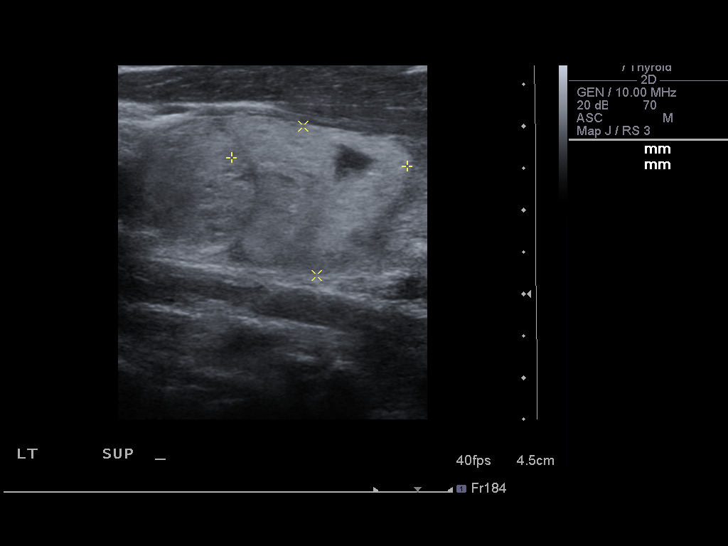
[im 29/49]
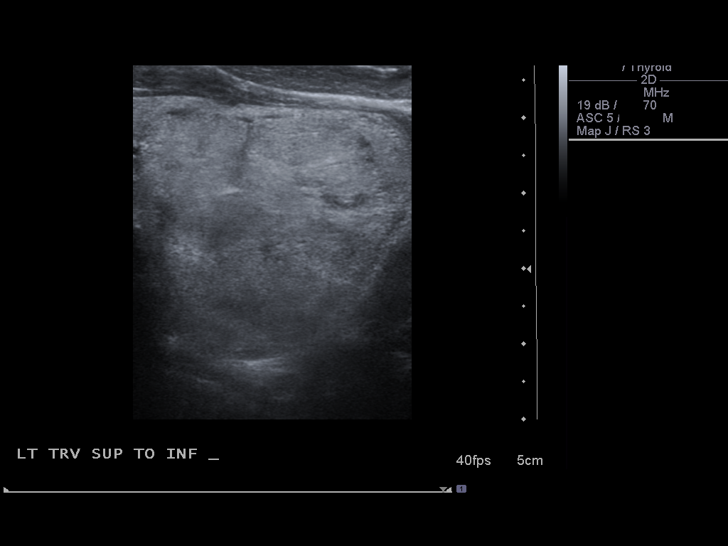
[im 33/49]
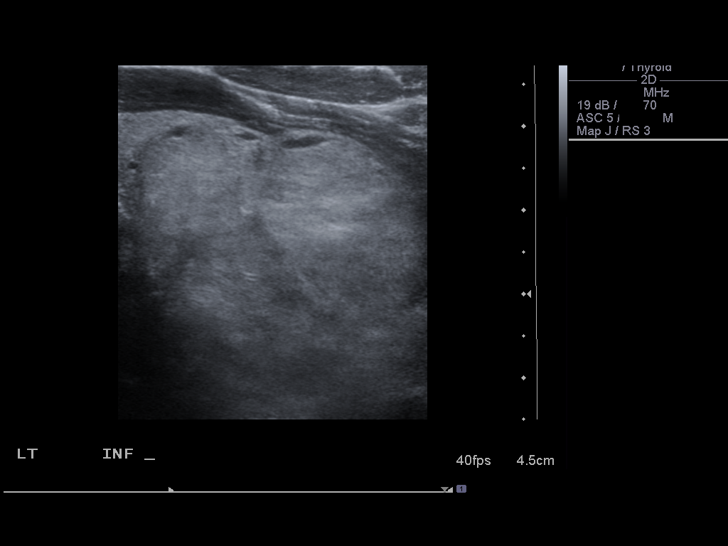
[im 37/49]
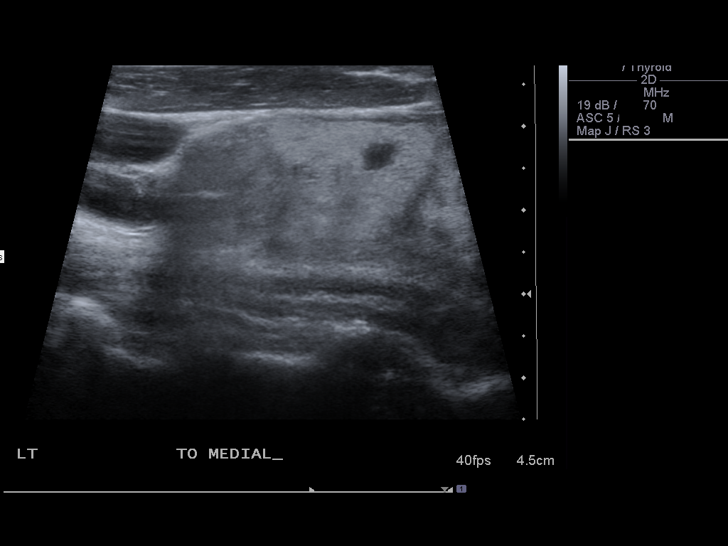
[im 41/49]
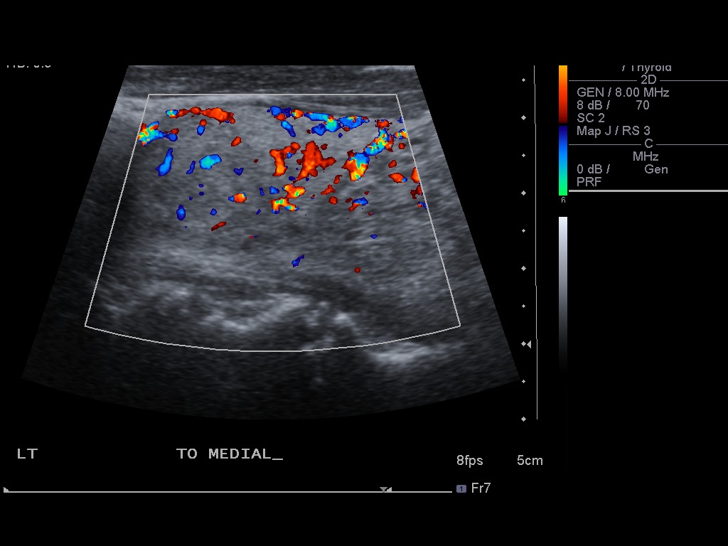
[im 45/49]
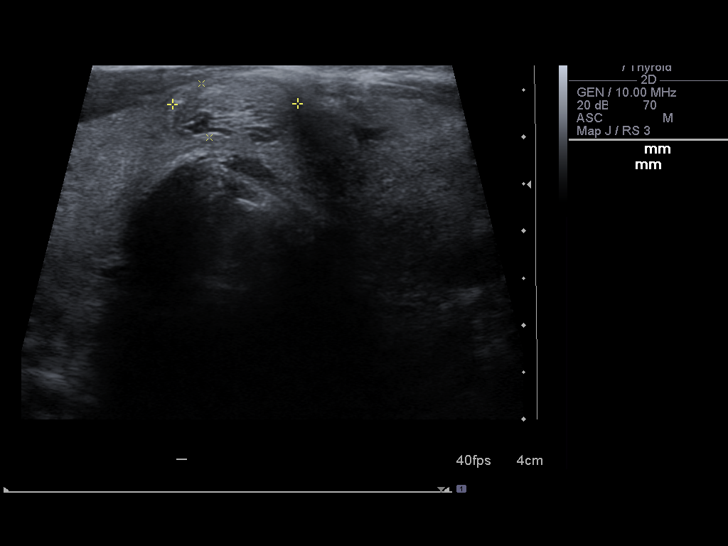
[im 49/49]
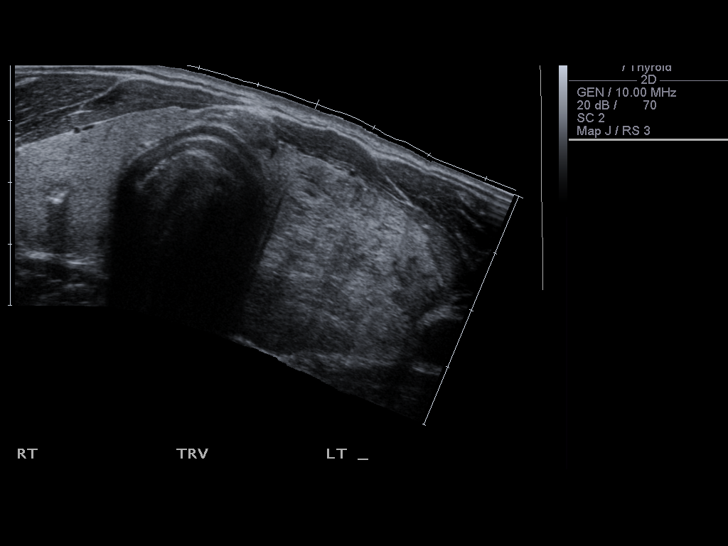

[13 of 25 positions shown; findings below may reference images not displayed]

FINDINGS: Right thyroid lobe:  Measures 5.2 x 2.2 x 1.6 cm.
Left thyroid lobe:  Measures 5.6 x 3.7 x 3.5 cm.
Isthmus:  Measures 0.9 cm.

Focal nodules:  Several predominately solid and nearly isoechoic
nodules are seen, in the left greater lobe.  The largest discrete
nodule in the left lobe is in the lower pole measures 2.4 x 1.6 x
2.0 cm.  Vascularity is seen within the nodule.  No calcifications
are seen in the nodule.

LEFT LOBE:

2 upper pole solid nodules are identified in the left lobe, one
measuring 2.1 x 1.8 x 2.0 cm and one measuring 1.6 x 1.4 x 1.3 cm.

Mid pole thyroid nodule measures 1.4 x 1.5 x 1.4 cm.

RIGHT LOBE:  5 mm focal area of calcification is seen in the mid
pole.

ISTHMUS:  Predominately solid nodule the isthmus measures 1.4 X
x 1.4 cm.

Lymphadenopathy:  None visualized.
IMPRESSION: Multiple left lobe of thyroid nodules and a single isthmic nodule.
Ultrasound-guided fine needle aspiration of the largest nodule in
the left lobe, in the lower lobe could be considered.  If not
performed, a 6-month follow-up thyroid ultrasound would be
suggested.

## 2012-11-26 ENCOUNTER — Telehealth: Payer: Self-pay | Admitting: *Deleted

## 2012-11-27 IMAGING — US US BIOPSY
1 series · 9 of 9 positions shown · non-contrast
Comparison: Ultrasound of the thyroid performed recently.

CLINICAL DATA: Multiple thyroid nodules with lymphadenopathy.
Request has been made for left lower pole thyroid nodule fine
needle aspiration.

ULTRASOUND GUIDED NEEDLE ASPIRATE BIOPSY OF THE THYROID GLAND

[Series 1: us biopsy · 0.08mm/px · 9 of 9 slices shown]
[im 1/9]
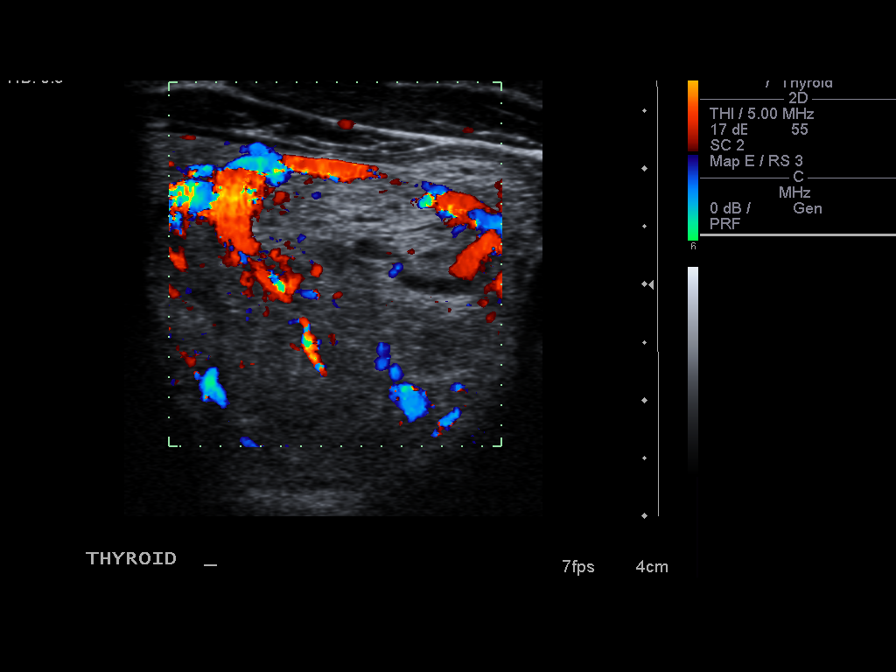
[im 2/9]
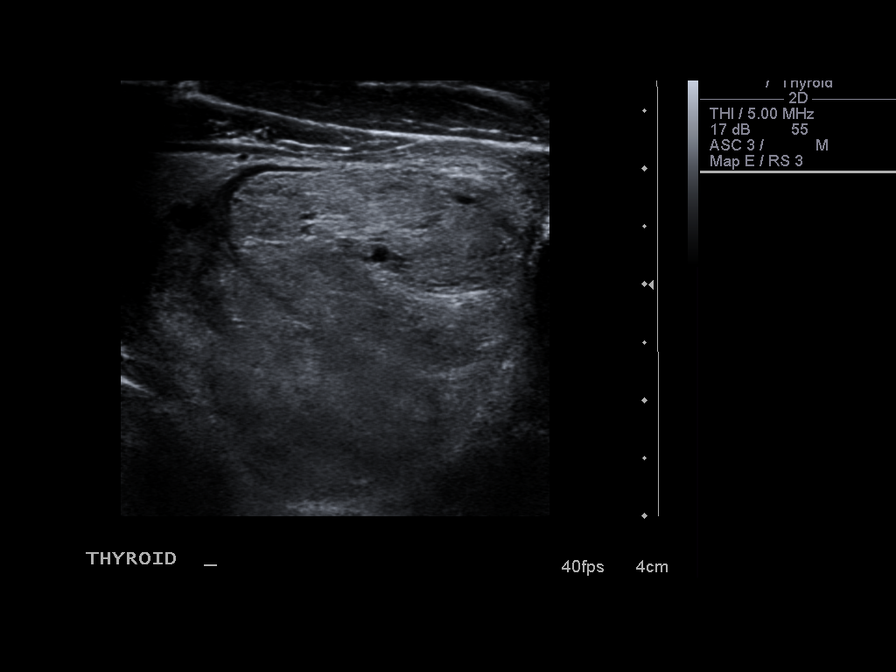
[im 3/9]
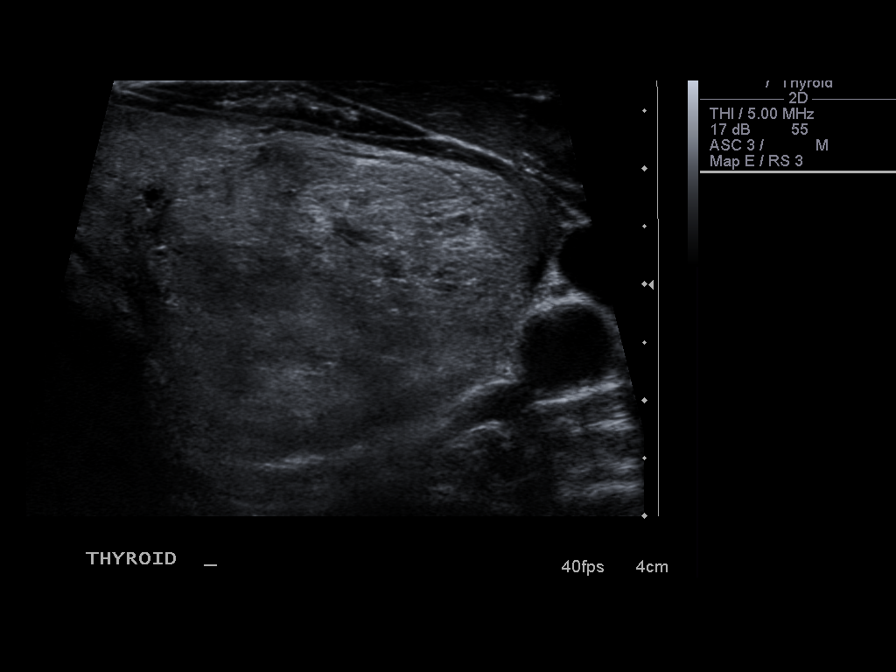
[im 4/9]
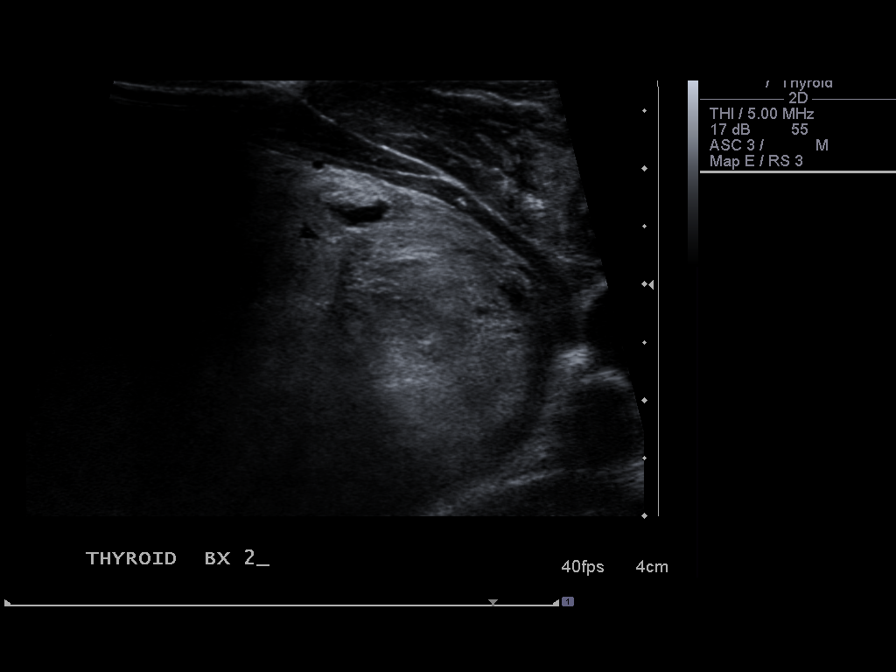
[im 5/9]
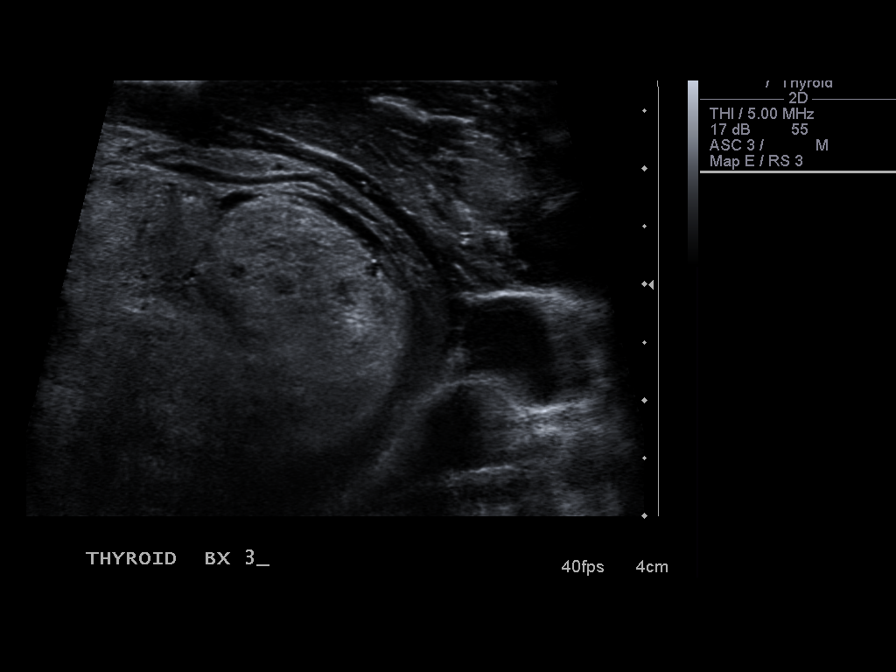
[im 6/9]
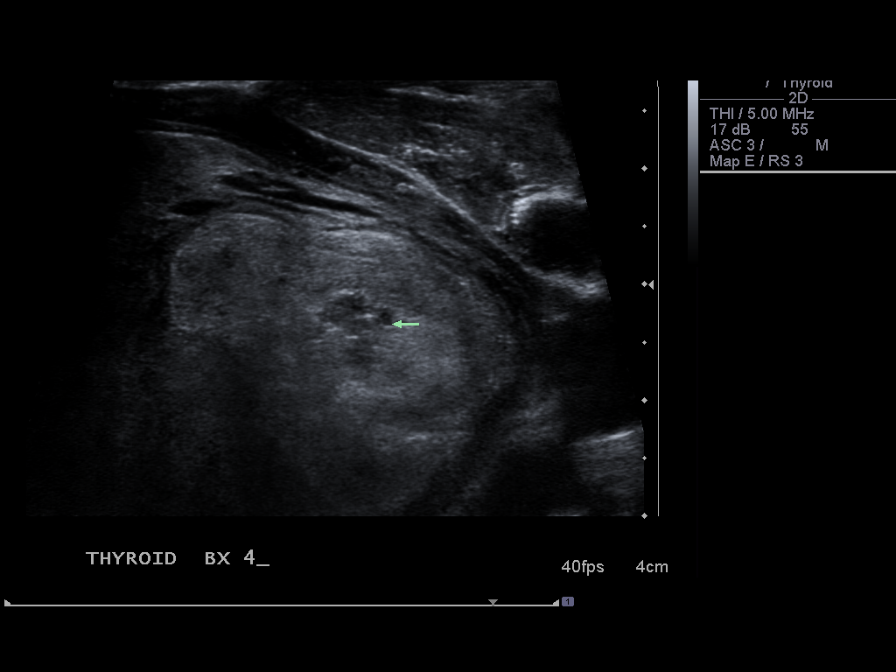
[im 7/9]
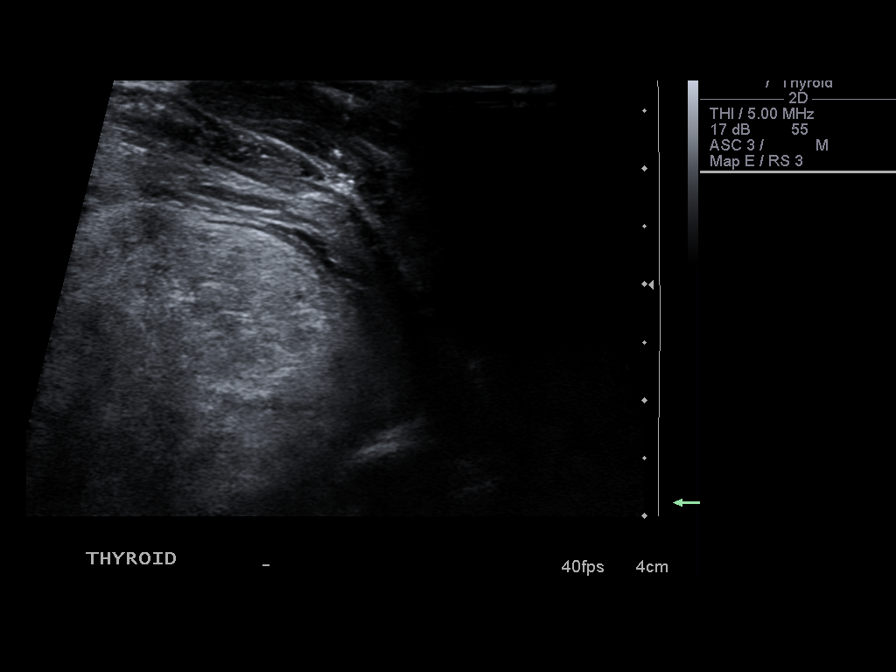
[im 8/9]
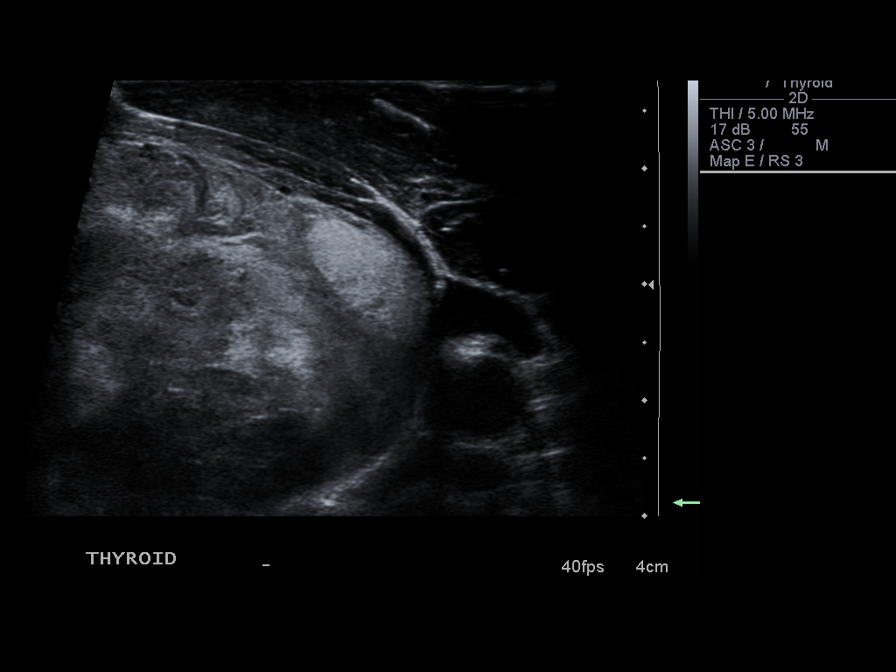
[im 9/9]
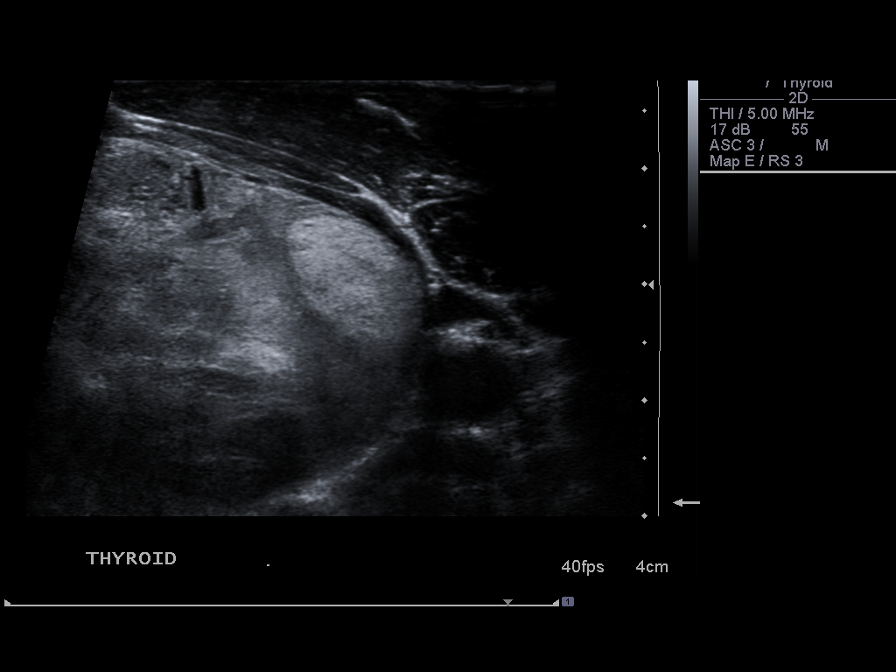

[9 of 9 positions shown; findings below may reference images not displayed]

Thyroid biopsy was thoroughly discussed with the patient and
questions were answered.  The benefits, risks, alternatives, and
complications were also discussed.  The patient understands and
wishes to proceed with the procedure.  Written consent was
obtained.

Ultrasound was performed to localize and mark an adequate site for
the biopsy.  The patient was then prepped and draped in a normal
sterile fashion.  Local anesthesia was provided with 1% lidocaine.
Using direct ultrasound guidance, 4 passes were made using 25 gauge
needles into the nodule within the left lower lobe of the thyroid.
Ultrasound was used to confirm needle placements on all occasions.
Specimens were sent to Pathology for analysis.

Complications:  None immediate

MPRESSION:
Ultrasound guided needle aspirate  performed of the left lower pole
thyroid nodule.

Read by: Oromiyaa, Galby.-BOUCHARD

## 2012-11-28 IMAGING — US US BIOPSY
1 series · 13 of 17 positions shown · non-contrast
Comparison: none

Clinical Data/Indication: Abnormal adenopathy.  HIV positive.

ULTRASOUND-GUIDED BIOPSY OF A RIGHT AXILLARY LYMPH NODE.  CORE.
Sedation: Versed two mg, Fentanyl 50 mcg.
Total Moderate Sedation Time: 15 minutes.
Procedure: The procedure, risks, benefits, and alternatives were
explained to the patient. Questions regarding the procedure were
encouraged and answered. The patient understands and consents to
the procedure.
The right axilla was prepped with betadine in a sterile fashion,
and a sterile drape was applied covering the operative field. A
sterile gown and sterile gloves were used for the procedure.
Under sonographic guidance, 818 gauge core biopsies of a right
axillary lymph node were obtained. Final imaging was performed.
Patient tolerated the procedure well without complication.  Vital
sign monitoring by nursing staff during the procedure will continue
as patient is in the special procedures unit for post procedure
observation.

[Series 1: us biopsy · 0.08mm/px · 13 of 17 slices shown]
[im 1/17]
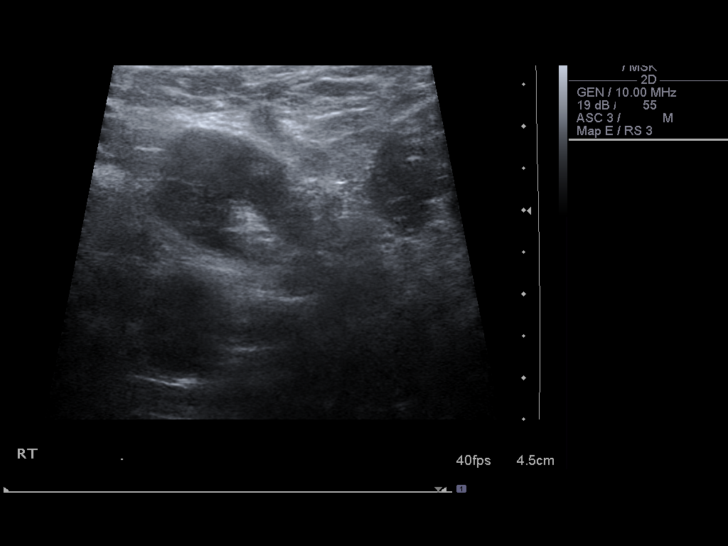
[im 2/17]
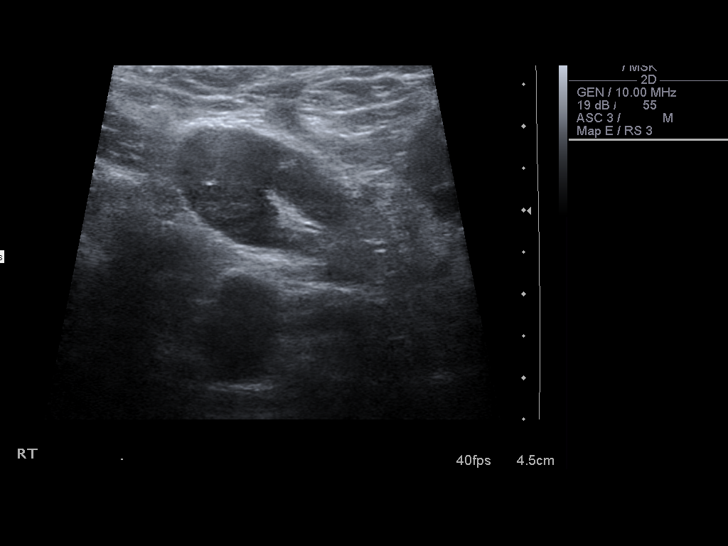
[im 4/17]
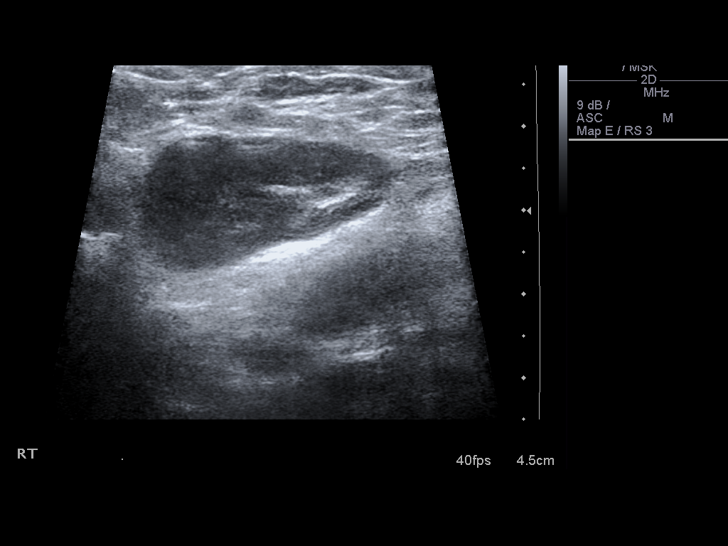
[im 5/17]
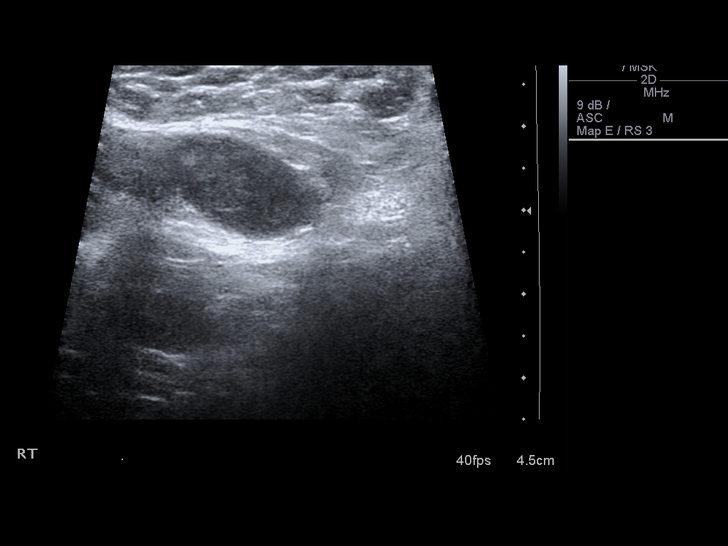
[im 6/17]
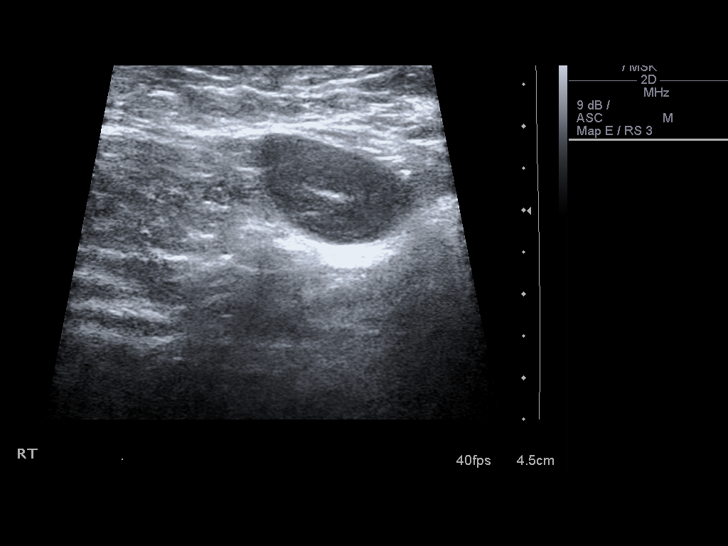
[im 8/17]
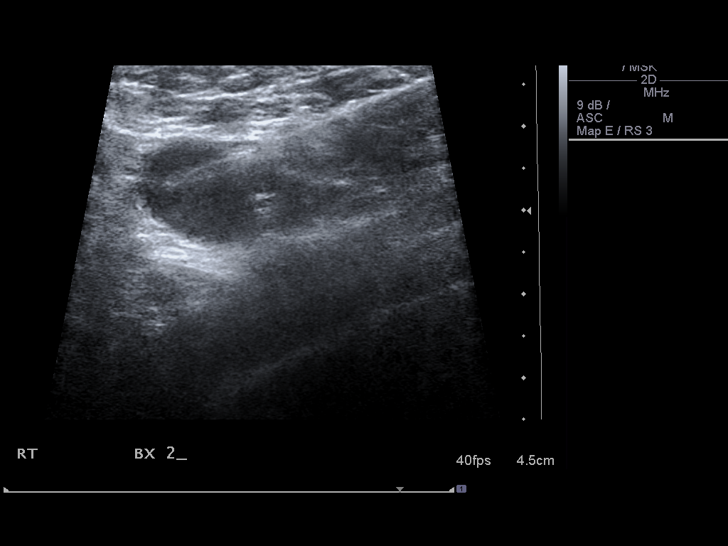
[im 9/17]
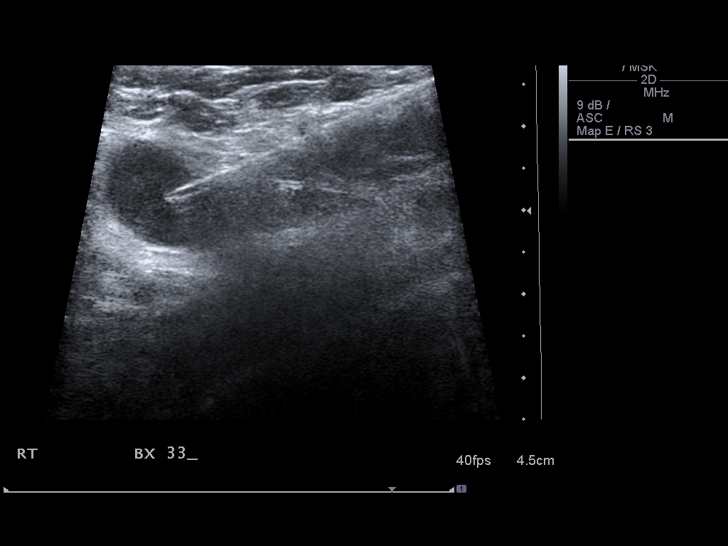
[im 10/17]
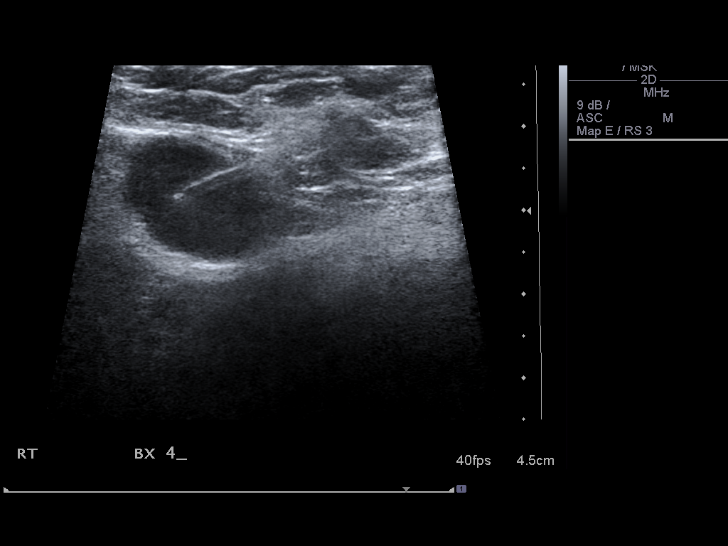
[im 12/17]
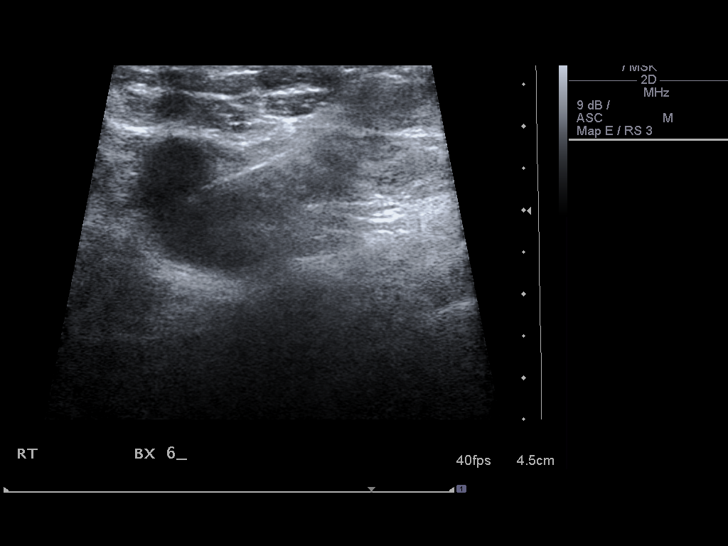
[im 13/17]
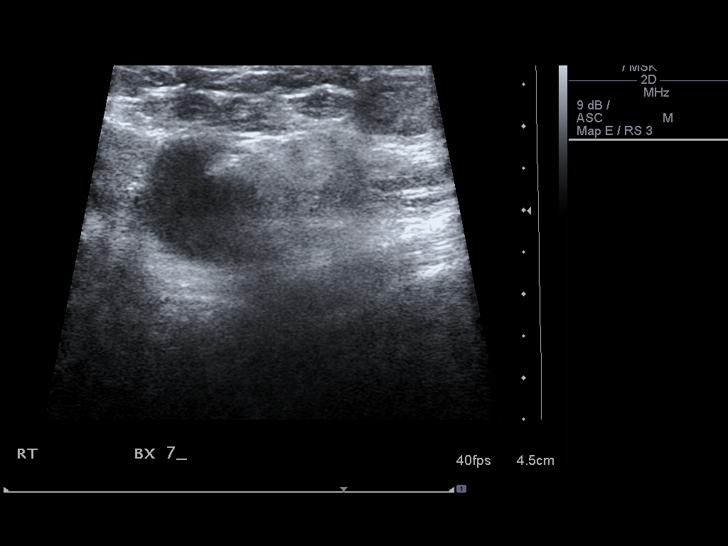
[im 14/17]
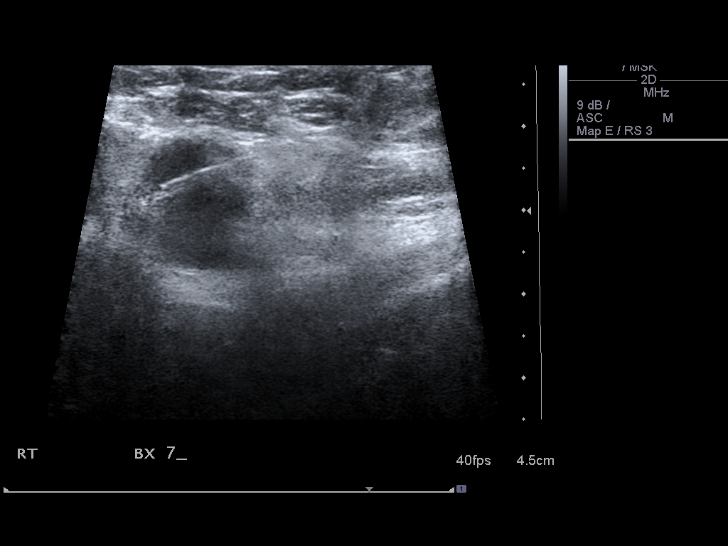
[im 16/17]
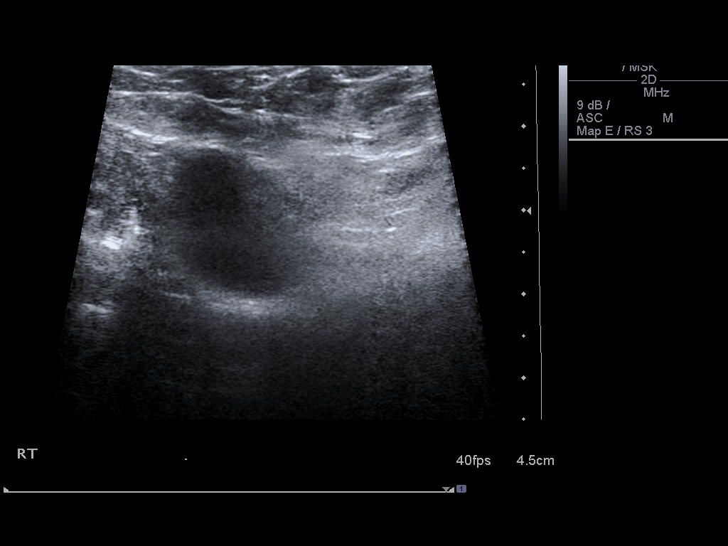
[im 17/17]
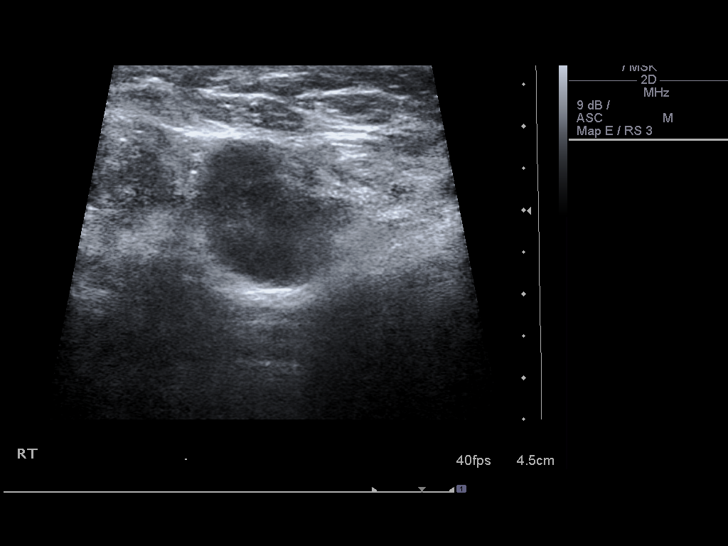

[13 of 17 positions shown; findings below may reference images not displayed]

FINDINGS: The images document guide needle placement within the
enlarged right axillary lymph node. Post biopsy images demonstrate
no hemorrhage.
IMPRESSION: Successful ultrasound-guided core biopsy of an enlarged right
axillary lymph node.

## 2012-12-11 ENCOUNTER — Ambulatory Visit (INDEPENDENT_AMBULATORY_CARE_PROVIDER_SITE_OTHER): Payer: 59 | Admitting: Internal Medicine

## 2012-12-11 ENCOUNTER — Telehealth: Payer: Self-pay | Admitting: *Deleted

## 2012-12-11 ENCOUNTER — Encounter: Payer: Self-pay | Admitting: Internal Medicine

## 2012-12-11 VITALS — BP 131/72 | HR 68 | Temp 98.3°F | Wt 202.0 lb

## 2012-12-11 DIAGNOSIS — K047 Periapical abscess without sinus: Secondary | ICD-10-CM

## 2012-12-11 DIAGNOSIS — B20 Human immunodeficiency virus [HIV] disease: Secondary | ICD-10-CM

## 2012-12-11 NOTE — Telephone Encounter (Signed)
Pt requesting antibiotic to be called in to [facility] on [address] until he can get to a dentist.  No office visits available this week.

## 2012-12-11 NOTE — Progress Notes (Signed)
[facility] HIV CLINIC NOTE  RF:V sick visit, tooth pain Subjective:    Patient ID: Nathan Wood, male    DOB: September 03, 1955, 57 y.o.   MRN: [id number]  HPI Nathan Wood is a 57yo M with HIV, CD 4 count of 830/VL <200 on stribild in sept 2014 who now presents with left sided facial swelling and left upper gum tenderness concern for tooth infection within the last 1-2 days. No fever or chills, just cheek swelling.  Current Outpatient Prescriptions on File Prior to Visit  Medication Sig Dispense Refill  . elvitegravir-cobicistat-emtricitabine-tenofovir (STRIBILD) 150-150-200-300 MG TABS Take 1 tablet by mouth daily with breakfast.  30 tablet  11  . fish oil-omega-3 fatty acids 1000 MG capsule Take 2 g by mouth daily.      Marland Kitchen loratadine (CLARITIN) 10 MG tablet Take 10 mg by mouth daily.        . Multiple Vitamin (MULTIVITAMIN) capsule Take 1 capsule by mouth daily.      . sertraline (ZOLOFT) 50 MG tablet Take 1 tablet (50 mg total) by mouth daily.  30 tablet  5  . traZODone (DESYREL) 50 MG tablet Take 1 tablet (50 mg total) by mouth at bedtime.  30 tablet  5  . vitamin B-12 (CYANOCOBALAMIN) 100 MCG tablet Take 100 mcg by mouth daily.      Marland Kitchen zolpidem (AMBIEN) 10 MG tablet Take 1 tablet (10 mg total) by mouth at bedtime as needed for sleep. Take 1/2 tablet at first, to see if small dose works  30 tablet  0  . amoxicillin (AMOXIL) 500 MG capsule       . Hydrocodone-Acetaminophen 5-300 MG TABS        No current facility-administered medications on file prior to visit.   Active Ambulatory Problems    Diagnosis Date Noted  . Abdominal pain, acute, epigastric 02/04/2011  . Hyperproteinemia 02/04/2011  . Proteinuria 02/04/2011  . Lymphadenopathy 02/04/2011  . Anemia 02/04/2011  . Rash and nonspecific skin eruption 02/04/2011  . HIV (human immunodeficiency virus infection) 02/10/2011  . Acute pancreatitis 02/10/2011  . Hypotension 04/21/2011  . Dizziness 04/21/2011  . Depression, major, single episode,  moderate 07/01/2011  . Tooth abscess 02/07/2012   Resolved Ambulatory Problems    Diagnosis Date Noted  . No Resolved Ambulatory Problems   Past Medical History  Diagnosis Date  . Human immunodeficiency virus   . Anxiety   . Depression     Review of Systems Review of Systems  Constitutional: Negative for fever, chills, diaphoresis, activity change, appetite change, fatigue and unexpected weight change.  HENT: tooth pain Eyes: Negative for photophobia and visual disturbance.  Respiratory: Negative for cough, chest tightness, shortness of breath, wheezing and stridor.  Cardiovascular: Negative for chest pain, palpitations and leg swelling.  Gastrointestinal: Negative for nausea, vomiting, abdominal pain, diarrhea, constipation, blood in stool, abdominal distention and anal bleeding.  Genitourinary: Negative for dysuria, hematuria, flank pain and difficulty urinating.  Musculoskeletal: Negative for myalgias, back pain, joint swelling, arthralgias and gait problem.  Skin: Negative for color change, pallor, rash and wound.  Neurological: Negative for dizziness, tremors, weakness and light-headedness.  Hematological: Negative for adenopathy. Does not bruise/bleed easily.  Psychiatric/Behavioral: Negative for behavioral problems, confusion, sleep disturbance, dysphoric mood, decreased concentration and agitation.       Objective:   Physical Exam  BP 131/72  Pulse 68  Temp(Src) 98.3 F (36.8 C) (Oral)  Wt 202 lb (91.627 kg)  BMI 25.92 kg/m2 Physical Exam  Constitutional: He is oriented  to person, place, and time. He appears well-developed and well-nourished. No distress.  HENT:  Mouth/Throat: Oropharynx is clear and moist. No oropharyngeal exudate. Left upper gum has retained tooth remnant, ? Root exposure.slightly tender to palpation Cardiovascular: Normal rate, regular rhythm and normal heart sounds. Exam reveals no gallop and no friction rub.  No murmur heard.   Pulmonary/Chest: Effort normal and breath sounds normal. No respiratory distress. He has no wheezes.  Abdominal: Soft. Bowel sounds are normal. He exhibits no distension. There is no tenderness.  Lymphadenopathy:  He has no cervical adenopathy.  Neurological: He is alert and oriented to person, place, and time.  Skin: Skin is warm and dry. No rash noted. No erythema.  Psychiatric: He has a normal mood and affect. His behavior is normal.         Assessment & Plan:  Dental abscess =Will fit into dental clinic who gave antibiotics and anti anxiolytic for next dental visit to do teeth extraction  hiv = continue on stribild  rtc as previously scheduled

## 2012-12-20 ENCOUNTER — Ambulatory Visit: Payer: Self-pay | Admitting: Internal Medicine

## 2012-12-31 ENCOUNTER — Ambulatory Visit: Payer: Self-pay | Admitting: Internal Medicine

## 2012-12-31 ENCOUNTER — Telehealth: Payer: Self-pay | Admitting: *Deleted

## 2012-12-31 NOTE — Telephone Encounter (Signed)
Called and left patient a voice mail to call the clinic to reschedule his appt, he no showed today. Erminia Mcnew  

## 2013-01-08 ENCOUNTER — Ambulatory Visit (INDEPENDENT_AMBULATORY_CARE_PROVIDER_SITE_OTHER): Payer: 59 | Admitting: Internal Medicine

## 2013-01-08 VITALS — BP 110/71 | HR 101 | Temp 102.9°F | Wt 171.5 lb

## 2013-01-08 DIAGNOSIS — R509 Fever, unspecified: Secondary | ICD-10-CM

## 2013-01-08 DIAGNOSIS — J209 Acute bronchitis, unspecified: Secondary | ICD-10-CM

## 2013-01-08 MED ORDER — DOXYCYCLINE HYCLATE 100 MG PO TABS: 100.0000 mg | ORAL_TABLET | Freq: Two times a day (BID) | ORAL | Status: DC

## 2013-01-08 MED ORDER — ACETAMINOPHEN 500 MG PO TABS
1000.0000 mg | ORAL_TABLET | Freq: Four times a day (QID) | ORAL | Status: DC | PRN
  Administered 2013-01-08: 1000 mg via ORAL

## 2013-01-08 NOTE — Progress Notes (Signed)
Body aches slightly better.

## 2013-01-08 NOTE — Progress Notes (Signed)
Patient ID: Nathan Wood, male   DOB: 08-29-1955, 57 y.o.   MRN: [id number]          [facility]  Patient Active Problem List   Diagnosis Date Noted  . Acute bronchitis 01/08/2013  . Tooth abscess 02/07/2012  . Depression, major, single episode, moderate 07/01/2011    Class: Acute  . Hypotension 04/21/2011  . Dizziness 04/21/2011  . HIV (human immunodeficiency virus infection) 02/10/2011  . Acute pancreatitis 02/10/2011  . Abdominal pain, acute, epigastric 02/04/2011  . Hyperproteinemia 02/04/2011  . Proteinuria 02/04/2011  . Lymphadenopathy 02/04/2011  . Anemia 02/04/2011  . Rash and nonspecific skin eruption 02/04/2011    Patient's Medications  New Prescriptions   DOXYCYCLINE (VIBRA-TABS) 100 MG TABLET    Take 1 tablet (100 mg total) by mouth 2 (two) times daily.  Previous Medications   ELVITEGRAVIR-COBICISTAT-EMTRICITABINE-TENOFOVIR (STRIBILD) 150-150-200-300 MG TABS    Take 1 tablet by mouth daily with breakfast.   FISH OIL-OMEGA-3 FATTY ACIDS 1000 MG CAPSULE    Take 2 g by mouth daily.   HYDROCODONE-ACETAMINOPHEN 5-300 MG TABS       LORATADINE (CLARITIN) 10 MG TABLET    Take 10 mg by mouth daily.     MULTIPLE VITAMIN (MULTIVITAMIN) CAPSULE    Take 1 capsule by mouth daily.   TRAZODONE (DESYREL) 50 MG TABLET    Take 1 tablet (50 mg total) by mouth at bedtime.   VITAMIN B-12 (CYANOCOBALAMIN) 100 MCG TABLET    Take 100 mcg by mouth daily.   ZOLPIDEM (AMBIEN) 10 MG TABLET    Take 1 tablet (10 mg total) by mouth at bedtime as needed for sleep. Take 1/2 tablet at first, to see if small dose works  Modified Medications   Modified Medication Previous Medication   SERTRALINE (ZOLOFT) 50 MG TABLET sertraline (ZOLOFT) 50 MG tablet      Take 50 mg by mouth daily.    Take 1 tablet (50 mg total) by mouth daily.  Discontinued Medications   AMOXICILLIN (AMOXIL) 500 MG CAPSULE        Subjective: Nathan Wood is seen on a work in basis. About 4 days ago he  began to feel ill and developed a cough productive of some yellow sputum. He has had subjective fevers and chills and today noticed some shortness of breath and wheezing. He denies missing any doses of his Stribild. He continues to smoke cigarettes. 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 He received his influenza and pneumococcal vaccinations earlier this fall.  Review of Systems: Pertinent items are noted in HPI.  Past Medical History  Diagnosis Date  . Human immunodeficiency virus   . Anxiety   . Depression     History  Substance Use Topics  . Smoking status: Current Every Day Smoker -- 0.50 packs/day for 10 years    Types: Cigarettes  . Smokeless tobacco: Not on file     Comment: encouragement  . Alcohol Use: 1.0 oz/week    2 drink(s) per week     Comment: occassional    Family History  Problem Relation Age of Onset  . Diabetes Mother   . Coronary artery disease Father     No Known Allergies  Objective: Temp: 103.2 F (39.6 C) (11/04 0840) Temp src: Oral (11/04 0840) BP: 110/71 mmHg (11/04 0840) Pulse Rate: 101 (11/04 0840)  General: He appears ill Oral: No oropharyngeal lesions. His dental pain has resolved Skin: No rash Lungs: Clear without  crackles or wheezing Cor: Regular S1 and S2 with no murmurs  HIV 1 RNA Quant (copies/mL)  Date Value  11/07/2012 119*  02/07/2012 2169*  01/24/2012 2296*     CD4 T Cell Abs (/uL)  Date Value  11/07/2012 830   01/24/2012 650   09/19/2011 770      Assessment: He has acute bronchitis, probably with a component of asthmatic bronchitis.  Plan: 1. Doxycycline 100 mg twice daily for 5 days 2. Antipyretics and hydration 3. Out of work this week 4. Continue Stribild 5. Cigarette cessation urged   Cliffton Asters, MD [facility] [phone] pager   [phone] cell 01/08/2013, 9:09 AM

## 2013-01-15 ENCOUNTER — Encounter: Payer: Self-pay | Admitting: Internal Medicine

## 2013-01-15 ENCOUNTER — Ambulatory Visit (INDEPENDENT_AMBULATORY_CARE_PROVIDER_SITE_OTHER): Payer: 59 | Admitting: Internal Medicine

## 2013-01-15 VITALS — BP 136/75 | HR 80 | Temp 98.3°F | Wt 194.0 lb

## 2013-01-15 DIAGNOSIS — B20 Human immunodeficiency virus [HIV] disease: Secondary | ICD-10-CM

## 2013-01-15 DIAGNOSIS — Z21 Asymptomatic human immunodeficiency virus [HIV] infection status: Secondary | ICD-10-CM

## 2013-02-11 NOTE — Progress Notes (Signed)
   Subjective:    Patient ID: Nathan Wood, male    DOB: 04-27-1955, 57 y.o.   MRN: [id number]  HPI 57yo M with HIV, CD 4 count of 830/VL <200 (in sep 2014) on stribild, was seen last week for acute bronchitis for Which he was treated with doxycycline 100mg  BID x 5 days. He states that he is feeling better. He continues to take stribild on a daily basis.  Current Outpatient Prescriptions on File Prior to Visit  Medication Sig Dispense Refill  . doxycycline (VIBRA-TABS) 100 MG tablet Take 1 tablet (100 mg total) by mouth 2 (two) times daily.  10 tablet  0  . elvitegravir-cobicistat-emtricitabine-tenofovir (STRIBILD) 150-150-200-300 MG TABS Take 1 tablet by mouth daily with breakfast.  30 tablet  11  . fish oil-omega-3 fatty acids 1000 MG capsule Take 2 g by mouth daily.      Marland Kitchen loratadine (CLARITIN) 10 MG tablet Take 10 mg by mouth daily.        . vitamin B-12 (CYANOCOBALAMIN) 100 MCG tablet Take 100 mcg by mouth daily.      . Hydrocodone-Acetaminophen 5-300 MG TABS       . sertraline (ZOLOFT) 50 MG tablet Take 50 mg by mouth daily.      . traZODone (DESYREL) 50 MG tablet Take 1 tablet (50 mg total) by mouth at bedtime.  30 tablet  5  . zolpidem (AMBIEN) 10 MG tablet Take 1 tablet (10 mg total) by mouth at bedtime as needed for sleep. Take 1/2 tablet at first, to see if small dose works  30 tablet  0   No current facility-administered medications on file prior to visit.   Active Ambulatory Problems    Diagnosis Date Noted  . Abdominal pain, acute, epigastric 02/04/2011  . Hyperproteinemia 02/04/2011  . Proteinuria 02/04/2011  . Lymphadenopathy 02/04/2011  . Anemia 02/04/2011  . Rash and nonspecific skin eruption 02/04/2011  . HIV (human immunodeficiency virus infection) 02/10/2011  . Acute pancreatitis 02/10/2011  . Hypotension 04/21/2011  . Dizziness 04/21/2011  . Depression, major, single episode, moderate 07/01/2011  . Tooth abscess 02/07/2012  . Acute bronchitis 01/08/2013    Resolved Ambulatory Problems    Diagnosis Date Noted  . No Resolved Ambulatory Problems   Past Medical History  Diagnosis Date  . Human immunodeficiency virus   . Anxiety   . Depression        Review of Systems 12 point ros is negative    Objective:   Physical Exam BP 136/75  Pulse 80  Temp(Src) 98.3 F (36.8 C) (Oral)  Wt 194 lb (87.998 kg) gen = a x o by 4 in NAD HEENT= PERRLA, eomi, MMM, no scleral icterus Pulm= CTAB, no w/c/r      Assessment & Plan:  HIV = has low level viremia, but still has not been on stribild for greater than 6 mo. Doing well with adherence. Continue with stribild  Acute bronchitis = resolved  health maintenance = previously received flu and pneumococcal vaccine

## 2013-02-14 ENCOUNTER — Other Ambulatory Visit: Payer: Self-pay

## 2013-03-04 ENCOUNTER — Ambulatory Visit: Payer: Self-pay | Admitting: Internal Medicine

## 2013-03-19 ENCOUNTER — Telehealth: Payer: Self-pay | Admitting: *Deleted

## 2013-03-19 NOTE — Telephone Encounter (Signed)
[facility].  Mr. Nathan Wood has been removed due to inactivity.  He has [employer] [facility]

## 2013-04-03 ENCOUNTER — Other Ambulatory Visit: Payer: Self-pay | Admitting: *Deleted

## 2013-04-03 DIAGNOSIS — B20 Human immunodeficiency virus [HIV] disease: Secondary | ICD-10-CM

## 2013-04-03 MED ORDER — ELVITEG-COBIC-EMTRICIT-TENOFDF 150-150-200-300 MG PO TABS: 1.0000 | ORAL_TABLET | Freq: Every day | ORAL | Status: DC

## 2013-05-07 ENCOUNTER — Ambulatory Visit: Payer: Self-pay | Admitting: Internal Medicine

## 2013-06-04 ENCOUNTER — Other Ambulatory Visit: Payer: Self-pay | Admitting: *Deleted

## 2013-06-04 ENCOUNTER — Other Ambulatory Visit: Payer: 59

## 2013-06-04 DIAGNOSIS — B20 Human immunodeficiency virus [HIV] disease: Secondary | ICD-10-CM

## 2013-06-04 LAB — COMPLETE METABOLIC PANEL WITH GFR
ALBUMIN: 4.2 g/dL (ref 3.5–5.2)
ALT: 12 U/L (ref 0–53)
AST: 17 U/L (ref 0–37)
Alkaline Phosphatase: 84 U/L (ref 39–117)
BILIRUBIN TOTAL: 0.4 mg/dL (ref 0.2–1.2)
BUN: 18 mg/dL (ref 6–23)
CO2: 27 mEq/L (ref 19–32)
Calcium: 9.5 mg/dL (ref 8.4–10.5)
Chloride: 102 mEq/L (ref 96–112)
Creat: 1.15 mg/dL (ref 0.50–1.35)
GFR, EST AFRICAN AMERICAN: 81 mL/min
GFR, Est Non African American: 70 mL/min
GLUCOSE: 85 mg/dL (ref 70–99)
Potassium: 4.4 mEq/L (ref 3.5–5.3)
SODIUM: 137 meq/L (ref 135–145)
TOTAL PROTEIN: 8.6 g/dL — AB (ref 6.0–8.3)

## 2013-06-04 LAB — CBC WITH DIFFERENTIAL/PLATELET
BASOS ABS: 0 10*3/uL (ref 0.0–0.1)
BASOS PCT: 1 % (ref 0–1)
Eosinophils Absolute: 0.2 10*3/uL (ref 0.0–0.7)
Eosinophils Relative: 4 % (ref 0–5)
HCT: 38 % — ABNORMAL LOW (ref 39.0–52.0)
HEMOGLOBIN: 13 g/dL (ref 13.0–17.0)
LYMPHS PCT: 43 % (ref 12–46)
Lymphs Abs: 1.9 10*3/uL (ref 0.7–4.0)
MCH: 32.8 pg (ref 26.0–34.0)
MCHC: 34.2 g/dL (ref 30.0–36.0)
MCV: 96 fL (ref 78.0–100.0)
Monocytes Absolute: 0.3 10*3/uL (ref 0.1–1.0)
Monocytes Relative: 6 % (ref 3–12)
NEUTROS ABS: 2 10*3/uL (ref 1.7–7.7)
Neutrophils Relative %: 46 % (ref 43–77)
Platelets: 279 10*3/uL (ref 150–400)
RBC: 3.96 MIL/uL — ABNORMAL LOW (ref 4.22–5.81)
RDW: 13.1 % (ref 11.5–15.5)
WBC: 4.4 10*3/uL (ref 4.0–10.5)

## 2013-06-05 LAB — T-HELPER CELL (CD4) - (RCID CLINIC ONLY)
CD4 % Helper T Cell: 49 % (ref 33–55)
CD4 T Cell Abs: 920 /uL (ref 400–2700)

## 2013-06-05 LAB — HIV-1 RNA QUANT-NO REFLEX-BLD

## 2013-07-04 ENCOUNTER — Ambulatory Visit: Payer: Self-pay | Admitting: Internal Medicine

## 2013-07-16 ENCOUNTER — Ambulatory Visit (INDEPENDENT_AMBULATORY_CARE_PROVIDER_SITE_OTHER): Payer: 59 | Admitting: Internal Medicine

## 2013-07-16 ENCOUNTER — Encounter: Payer: Self-pay | Admitting: Internal Medicine

## 2013-07-16 VITALS — BP 120/72 | HR 61 | Temp 98.8°F | Wt 198.0 lb

## 2013-07-16 DIAGNOSIS — J310 Chronic rhinitis: Secondary | ICD-10-CM

## 2013-07-16 DIAGNOSIS — Z23 Encounter for immunization: Secondary | ICD-10-CM

## 2013-07-16 DIAGNOSIS — Z716 Tobacco abuse counseling: Secondary | ICD-10-CM

## 2013-07-16 DIAGNOSIS — B20 Human immunodeficiency virus [HIV] disease: Secondary | ICD-10-CM

## 2013-07-16 DIAGNOSIS — Z0000 Encounter for general adult medical examination without abnormal findings: Secondary | ICD-10-CM

## 2013-07-16 DIAGNOSIS — Z7189 Other specified counseling: Secondary | ICD-10-CM

## 2013-07-16 MED ORDER — FLUTICASONE PROPIONATE 50 MCG/ACT NA SUSP: 2.0000 | Freq: Every day | NASAL | Status: DC

## 2013-07-16 NOTE — Progress Notes (Signed)
   Subjective:    Patient ID: Griffin BasilDonald Barbara, male    DOB: 12/16/1955, 58 y.o.   MRN: [id number]  HPI 57yo M with HIV, Cd 4 count of 920/VL<20, on stribild. Doing well with meds. Not missing doses. He states his only health complaint is that he has constant runny nose which he has tried a few over the counter meds without improvement  Current Outpatient Prescriptions on File Prior to Visit  Medication Sig Dispense Refill  . elvitegravir-cobicistat-emtricitabine-tenofovir (STRIBILD) 150-150-200-300 MG TABS tablet Take 1 tablet by mouth daily with breakfast.  30 tablet  11  . fish oil-omega-3 fatty acids 1000 MG capsule Take 2 g by mouth daily.      Marland Kitchen. loratadine (CLARITIN) 10 MG tablet Take 10 mg by mouth daily.        Marland Kitchen. doxycycline (VIBRA-TABS) 100 MG tablet Take 1 tablet (100 mg total) by mouth 2 (two) times daily.  10 tablet  0  . Hydrocodone-Acetaminophen 5-300 MG TABS       . sertraline (ZOLOFT) 50 MG tablet Take 50 mg by mouth daily.      . traZODone (DESYREL) 50 MG tablet Take 1 tablet (50 mg total) by mouth at bedtime.  30 tablet  5  . vitamin B-12 (CYANOCOBALAMIN) 100 MCG tablet Take 100 mcg by mouth daily.      Marland Kitchen. zolpidem (AMBIEN) 10 MG tablet Take 1 tablet (10 mg total) by mouth at bedtime as needed for sleep. Take 1/2 tablet at first, to see if small dose works  30 tablet  0   No current facility-administered medications on file prior to visit.    Soc hx: smoked 1/2 ppd. Not in a relationship   Review of Systems 10 point ros is negative except for chronic runny nose    Objective:   Physical Exam BP 120/72  Pulse 61  Temp(Src) 98.8 F (37.1 C) (Oral)  Wt 198 lb (89.812 kg) Physical Exam  Constitutional: He is oriented to person, place, and time. He appears well-developed and well-nourished. No distress.  HENT: missing upper teeth Mouth/Throat: Oropharynx is clear and moist. No oropharyngeal exudate.  Cardiovascular: Normal rate, regular rhythm and normal heart sounds.  Exam reveals no gallop and no friction rub.  No murmur heard.  Pulmonary/Chest: Effort normal and breath sounds normal. No respiratory distress. He has no wheezes.  Lymphadenopathy:  He has no cervical adenopathy.  Neurological: He is alert and oriented to person, place, and time.  Skin: Skin is warm and dry. No rash noted. No erythema.  Psychiatric: He has a normal mood and affect. His behavior is normal.          Assessment & Plan:  hiv = well controlled. Continue stribild  Chronic rhinitis = can do a trial of flonase or beclonase. Able to take it since he is not on a protease inhbitor.  Smoking cessation = patient is in precontemplative state in behavior change model. Did 3 minutes of counseling on cessation  Health maintenance= needs hep a #2  rtc in 3 months, labs 2 wk prior

## 2013-08-27 ENCOUNTER — Telehealth: Payer: Self-pay | Admitting: *Deleted

## 2013-08-27 NOTE — Telephone Encounter (Signed)
Patient walked into clinic today saying [facility] [employer] [facility] He will come back in to speak to Randolm IdolPam Jones this afternoon. Nathan MolaJacqueline Grecia Wood

## 2013-10-16 ENCOUNTER — Other Ambulatory Visit: Payer: 59

## 2013-10-16 DIAGNOSIS — B20 Human immunodeficiency virus [HIV] disease: Secondary | ICD-10-CM

## 2013-10-16 LAB — CBC WITH DIFFERENTIAL/PLATELET
Basophils Absolute: 0 10*3/uL (ref 0.0–0.1)
Basophils Relative: 1 % (ref 0–1)
EOS PCT: 4 % (ref 0–5)
Eosinophils Absolute: 0.2 10*3/uL (ref 0.0–0.7)
HEMATOCRIT: 35.5 % — AB (ref 39.0–52.0)
HEMOGLOBIN: 11.9 g/dL — AB (ref 13.0–17.0)
Lymphocytes Relative: 48 % — ABNORMAL HIGH (ref 12–46)
Lymphs Abs: 2.3 10*3/uL (ref 0.7–4.0)
MCH: 32.4 pg (ref 26.0–34.0)
MCHC: 33.5 g/dL (ref 30.0–36.0)
MCV: 96.7 fL (ref 78.0–100.0)
MONO ABS: 0.2 10*3/uL (ref 0.1–1.0)
MONOS PCT: 5 % (ref 3–12)
NEUTROS ABS: 2 10*3/uL (ref 1.7–7.7)
Neutrophils Relative %: 42 % — ABNORMAL LOW (ref 43–77)
Platelets: 269 10*3/uL (ref 150–400)
RBC: 3.67 MIL/uL — ABNORMAL LOW (ref 4.22–5.81)
RDW: 12.9 % (ref 11.5–15.5)
WBC: 4.8 10*3/uL (ref 4.0–10.5)

## 2013-10-17 LAB — BASIC METABOLIC PANEL
BUN: 14 mg/dL (ref 6–23)
CALCIUM: 9.1 mg/dL (ref 8.4–10.5)
CO2: 27 mEq/L (ref 19–32)
Chloride: 103 mEq/L (ref 96–112)
Creat: 1.26 mg/dL (ref 0.50–1.35)
GLUCOSE: 93 mg/dL (ref 70–99)
POTASSIUM: 4 meq/L (ref 3.5–5.3)
SODIUM: 137 meq/L (ref 135–145)

## 2013-10-18 LAB — T-HELPER CELL (CD4) - (RCID CLINIC ONLY)
CD4 % Helper T Cell: 56 % — ABNORMAL HIGH (ref 33–55)
CD4 T CELL ABS: 1300 /uL (ref 400–2700)

## 2013-10-19 LAB — HIV-1 RNA QUANT-NO REFLEX-BLD
HIV 1 RNA Quant: 29 copies/mL — ABNORMAL HIGH (ref <20)
HIV-1 RNA QUANT, LOG: 1.46 {Log} — AB (ref <1.30)

## 2013-10-21 ENCOUNTER — Encounter (HOSPITAL_COMMUNITY): Payer: Self-pay | Admitting: Emergency Medicine

## 2013-10-21 ENCOUNTER — Emergency Department (HOSPITAL_COMMUNITY): Payer: 59

## 2013-10-21 ENCOUNTER — Emergency Department (HOSPITAL_COMMUNITY)
Admission: EM | Admit: 2013-10-21 | Discharge: 2013-10-21 | Disposition: A | Discharge status: Home / Self Care | Payer: 59 | Attending: Emergency Medicine | Admitting: Emergency Medicine

## 2013-10-21 DIAGNOSIS — S92309A Fracture of unspecified metatarsal bone(s), unspecified foot, initial encounter for closed fracture: Secondary | ICD-10-CM | POA: Insufficient documentation

## 2013-10-21 DIAGNOSIS — Z21 Asymptomatic human immunodeficiency virus [HIV] infection status: Secondary | ICD-10-CM | POA: Diagnosis not present

## 2013-10-21 DIAGNOSIS — IMO0002 Reserved for concepts with insufficient information to code with codable children: Secondary | ICD-10-CM | POA: Insufficient documentation

## 2013-10-21 DIAGNOSIS — Y9389 Activity, other specified: Secondary | ICD-10-CM | POA: Insufficient documentation

## 2013-10-21 DIAGNOSIS — S99919A Unspecified injury of unspecified ankle, initial encounter: Secondary | ICD-10-CM

## 2013-10-21 DIAGNOSIS — Z79899 Other long term (current) drug therapy: Secondary | ICD-10-CM | POA: Insufficient documentation

## 2013-10-21 DIAGNOSIS — F17200 Nicotine dependence, unspecified, uncomplicated: Secondary | ICD-10-CM | POA: Diagnosis not present

## 2013-10-21 DIAGNOSIS — X500XXA Overexertion from strenuous movement or load, initial encounter: Secondary | ICD-10-CM | POA: Diagnosis not present

## 2013-10-21 DIAGNOSIS — Y9289 Other specified places as the place of occurrence of the external cause: Secondary | ICD-10-CM | POA: Diagnosis not present

## 2013-10-21 DIAGNOSIS — S8990XA Unspecified injury of unspecified lower leg, initial encounter: Secondary | ICD-10-CM | POA: Insufficient documentation

## 2013-10-21 DIAGNOSIS — S99929A Unspecified injury of unspecified foot, initial encounter: Secondary | ICD-10-CM

## 2013-10-21 DIAGNOSIS — Z9889 Other specified postprocedural states: Secondary | ICD-10-CM | POA: Insufficient documentation

## 2013-10-21 DIAGNOSIS — Z8659 Personal history of other mental and behavioral disorders: Secondary | ICD-10-CM | POA: Diagnosis not present

## 2013-10-21 DIAGNOSIS — S92301A Fracture of unspecified metatarsal bone(s), right foot, initial encounter for closed fracture: Secondary | ICD-10-CM

## 2013-10-21 MED ORDER — NAPROXEN 375 MG PO TABS: 375.0000 mg | ORAL_TABLET | Freq: Two times a day (BID) | ORAL | Status: DC

## 2013-10-21 MED ORDER — HYDROCODONE-ACETAMINOPHEN 5-325 MG PO TABS: 1.0000 | ORAL_TABLET | ORAL | Status: DC | PRN

## 2013-10-21 NOTE — ED Provider Notes (Signed)
CSN: [id number]     Arrival date & time 10/21/13  1010 History  This chart was scribed for non-physician practitioner, Nathan Pel, PA-C working with Nathan Munch, MD by Nathan Wood, ED scribe. This patient was seen in room TR10C/TR10C and the patient's care was started at 11:33 AM.   Chief Complaint  Patient presents with  . Ankle Pain   The history is provided by the patient. No language interpreter was used.   HPI Comments: Nathan Wood is a 58 y.o. male who presents to the Emergency Department complaining of right foot injury that occurred earlier this morning. Pt states he stepped down onto a shoe while he was barefoot and inverted his foot. He has sudden onset right foot and ankle pain with associated swelling. Bearing weight worsens the pain. Pt has used an ACE bandage with no relief. Denies head or neck injury, no loc.  Past Medical History  Diagnosis Date  . Human immunodeficiency virus   . Anxiety   . Depression    Past Surgical History  Procedure Laterality Date  . Knee surgery    . Pancreatitis     Family History  Problem Relation Age of Onset  . Diabetes Mother   . Coronary artery disease Father    History  Substance Use Topics  . Smoking status: Current Every Day Smoker -- 0.50 packs/day for 10 years    Types: Cigarettes  . Smokeless tobacco: Not on file     Comment: encouragement  . Alcohol Use: 1.0 oz/week    2 drink(s) per week     Comment: occassional    Review of Systems  Musculoskeletal: Positive for arthralgias and joint swelling.  All other systems reviewed and are negative.  Allergies  Review of patient's allergies indicates no known allergies.  Home Medications   Prior to Admission medications   Medication Sig Start Date End Date Taking? Authorizing Provider  elvitegravir-cobicistat-emtricitabine-tenofovir (STRIBILD) 150-150-200-300 MG TABS tablet Take 1 tablet by mouth daily with breakfast. 04/03/13  Yes Nathan Munson, MD  fish  oil-omega-3 fatty acids 1000 MG capsule Take 2 g by mouth daily.   Yes Historical Provider, MD  fluticasone (FLONASE) 50 MCG/ACT nasal spray Place 2 sprays into both nostrils daily. 07/16/13  Yes Nathan Munson, MD  loratadine (CLARITIN) 10 MG tablet Take 10 mg by mouth daily.     Yes Historical Provider, MD  vitamin B-12 (CYANOCOBALAMIN) 100 MCG tablet Take 100 mcg by mouth daily.   Yes Historical Provider, MD  HYDROcodone-acetaminophen (NORCO/VICODIN) 5-325 MG per tablet Take 1-2 tablets by mouth every 4 (four) hours as needed. 10/21/13   Nathan Wood Irine Seal, PA-C  naproxen (NAPROSYN) 375 MG tablet Take 1 tablet (375 mg total) by mouth 2 (two) times daily. 10/21/13   Tiwanna Tuch Irine Seal, PA-C   BP 109/92  Pulse 66  Temp(Src) 98.1 F (36.7 C) (Oral)  Resp 16  SpO2 100%  Physical Exam  Nursing note and vitals reviewed. Constitutional: He is oriented to person, place, and time. He appears well-developed and well-nourished. No distress.  HENT:  Head: Normocephalic and atraumatic.  Eyes: Conjunctivae and EOM are normal.  Neck: Neck supple. No tracheal deviation present.  Cardiovascular: Normal rate.   Pedal pulse 2+ on the right.  Pulmonary/Chest: Effort normal. No respiratory distress.  Musculoskeletal: Normal range of motion.  Swelling and tenderness to fourth metatarsal of right foot. Full ROM of all five toes. No tinting of the skin.   Neurological: He is alert and oriented to  person, place, and time.  Skin: Skin is warm and dry.  Psychiatric: He has a normal mood and affect. His behavior is normal.    ED Course  Procedures (including critical care time)  DIAGNOSTIC STUDIES: Oxygen Saturation is 100% on RA, normal by my interpretation.    COORDINATION OF CARE: 11:37 AM-Advised pt of xray results. Discussed treatment plan which includes a post op boot, Vicodin, naproxen and ice with pt at bedside and pt agreed to plan. Will give pt an orthopedic referral and advised him to follow up.    Labs Review Labs Reviewed - No data to display  Imaging Review Dg Foot Complete Right  10/21/2013   CLINICAL DATA:  Injury walking down steps, pain at lateral portion of foot at plantar aspect  EXAM: RIGHT FOOT COMPLETE - 3+ VIEW  COMPARISON:  None  FINDINGS: Bones appear slightly demineralized.  Joint spaces preserved.  Nondisplaced fracture at distal aspect of fourth metatarsal.  No additional fracture or dislocation.  Large degenerative cyst within tarsal navicular.  IMPRESSION: Nondisplaced fracture at distal fourth metatarsal.  Large degenerative cyst within tarsal navicular.   Electronically Signed   By: Ulyses SouthwardMark  Boles M.D.   On: 10/21/2013 11:21     EKG Interpretation None      MDM   Final diagnoses:  Fracture of 4th metatarsal, right, closed, initial encounter    58 y.o.Nathan Wood's evaluation in the Emergency Department is complete. It has been determined that no acute conditions requiring further emergency intervention are present at this time. The patient/guardian have been advised of the diagnosis and plan. We have discussed signs and symptoms that warrant return to the ED, such as changes or worsening in symptoms.  Vital signs are stable at discharge. Filed Vitals:   10/21/13 1136  BP: 109/92  Pulse: 66  Temp: 98.1 F (36.7 C)  Resp: 16    Patient/guardian has voiced understanding and agreed to follow-up with the PCP or specialist.   I personally performed the services described in this documentation, which was scribed in my presence. The recorded information has been reviewed and is accurate.  Dorthula Matasiffany G Audric Venn, PA-C 10/22/13 1623

## 2013-10-21 NOTE — ED Notes (Addendum)
Called ortho to come and place cam walker. 

## 2013-10-21 NOTE — Discharge Instructions (Signed)
Metatarsal Fracture, Undisplaced A metatarsal fracture is a break in the bone(s) of the foot. These are the bones of the foot that connect your toes to the bones of the ankle. DIAGNOSIS  The diagnoses of these fractures are usually made with X-rays. If there are problems in the forefoot and x-rays are normal a later bone scan will usually make the diagnosis.  TREATMENT AND HOME CARE INSTRUCTIONS  Treatment may or may not include a cast or walking shoe. When casts are needed the use is usually for short periods of time so as not to slow down healing with muscle wasting (atrophy).  Activities should be stopped until further advised by your caregiver.  Wear shoes with adequate shock absorbing capabilities and stiff soles.  Alternative exercise may be undertaken while waiting for healing. These may include bicycling and swimming, or as your caregiver suggests.  It is important to keep all follow-up visits or specialty referrals. The failure to keep these appointments could result in improper bone healing and chronic pain or disability.  Warning: Do not drive a car or operate a motor vehicle until your caregiver specifically tells you it is safe to do so. IF YOU DO NOT HAVE A CAST OR SPLINT:  You may walk on your injured foot as tolerated or advised.  Do not put any weight on your injured foot for as long as directed by your caregiver. Slowly increase the amount of time you walk on the foot as the pain allows or as advised.  Use crutches until you can bear weight without pain. A gradual increase in weight bearing may help.  Apply ice to the injury for 15-20 minutes each hour while awake for the first 2 days. Put the ice in a plastic bag and place a towel between the bag of ice and your skin.  Only take over-the-counter or prescription medicines for pain, discomfort, or fever as directed by your caregiver. SEEK IMMEDIATE MEDICAL CARE IF:   Your cast gets damaged or breaks.  You have  continued severe pain or more swelling than you did before the cast was put on, or the pain is not controlled with medications.  Your skin or nails below the injury turn blue or grey, or feel cold or numb.  There is a bad smell, or new stains or pus-like (purulent) drainage coming from the cast. MAKE SURE YOU:   Understand these instructions.  Will watch your condition.  Will get help right away if you are not doing well or get worse. Document Released: 11/13/2001 Document Revised: 05/16/2011 Document Reviewed: 10/05/2007 [facility] Patient Information 2015 [employer] [place]. This information is not intended to replace advice given to you by your health care provider. Make sure you discuss any questions you have with your health care provider.  Cast Shoe Cast shoes are rigid shoes that help you walk with a cast. Cast shoes help treat minor foot sprains and fractures. These shoes reduce the amount of movement in the joints of the foot. This makes walking less painful. As long as your injury is painful, you should use crutches to get around. Avoid all weight bearing until your caregiver approves. When your caregiver says it is safe to put weight on your injured leg or foot, you can begin to use your cast shoe. Make sure it is adjusted right. Ask your caregiver to show you how to adjust it if you are not sure. Most of these shoes have velcro straps which make this easy. You should avoid  using any other shoes until your caregiver says it is safe. Document Released: 03/31/2004 Document Revised: 05/16/2011 Document Reviewed: 03/20/2008 [facility] Patient Information 2015 [place], [place]. This information is not intended to replace advice given to you by your health care provider. Make sure you discuss any questions you have with your health care provider.

## 2013-10-21 NOTE — ED Notes (Signed)
Reports stepping down and twisting foot, having pain right foot and ankle. Ambulatory at triage.

## 2013-10-21 NOTE — Progress Notes (Signed)
Orthopedic Tech Progress Note Patient Details:  Nathan Wood [birth year] [id number]  Ortho Devices Type of Ortho Device: CAM walker Ortho Device/Splint Interventions: Application   Shawnie PonsCammer, Ketty Bitton Carol 10/21/2013, 12:19 PM

## 2013-10-23 NOTE — ED Provider Notes (Signed)
  Medical screening examination/treatment/procedure(s) were performed by non-physician practitioner and as supervising physician I was immediately available for consultation/collaboration.   EKG Interpretation None         Shalana Jardin, MD 10/23/13 0735 

## 2013-10-30 ENCOUNTER — Encounter: Payer: Self-pay | Admitting: Internal Medicine

## 2013-10-30 ENCOUNTER — Ambulatory Visit (INDEPENDENT_AMBULATORY_CARE_PROVIDER_SITE_OTHER): Payer: 59 | Admitting: Internal Medicine

## 2013-10-30 VITALS — BP 111/66 | HR 63 | Temp 98.3°F | Wt 190.0 lb

## 2013-10-30 DIAGNOSIS — F5221 Male erectile disorder: Secondary | ICD-10-CM

## 2013-10-30 DIAGNOSIS — Z23 Encounter for immunization: Secondary | ICD-10-CM

## 2013-10-30 DIAGNOSIS — F528 Other sexual dysfunction not due to a substance or known physiological condition: Secondary | ICD-10-CM

## 2013-10-30 MED ORDER — SILDENAFIL CITRATE 50 MG PO TABS: 25.0000 mg | ORAL_TABLET | Freq: Every day | ORAL | Status: DC | PRN

## 2013-10-30 NOTE — Progress Notes (Signed)
Patient ID: Nathan Wood, male   DOB: 1955/12/31, 58 y.o.   MRN: [id number]       Patient ID: Nathan Wood, male   DOB: 1955-07-12, 58 y.o.   MRN: [id number]  HPI  [name] is a 58yo M with HIV with cd 4 count of 1300/VL 28 on stribild with good adherence. Sustained a foot fracture to right foot this past Monday, wearing a boot. Intolerant of hydrocodone, currently not taking pain meds.   He is in a new relationship with HIV+ male. They are using condoms. He is noticing not being able to maintain erection and with difficulty with ejaculation. Wanted to try viagra  Hep B immune, received 2 doses of hep A.  Outpatient Encounter Prescriptions as of 10/30/2013  Medication Sig  . elvitegravir-cobicistat-emtricitabine-tenofovir (STRIBILD) 150-150-200-300 MG TABS tablet Take 1 tablet by mouth daily with breakfast.  . fish oil-omega-3 fatty acids 1000 MG capsule Take 2 g by mouth daily.  . fluticasone (FLONASE) 50 MCG/ACT nasal spray Place 2 sprays into both nostrils daily.  Marland Kitchen HYDROcodone-acetaminophen (NORCO/VICODIN) 5-325 MG per tablet Take 1-2 tablets by mouth every 4 (four) hours as needed.  . vitamin B-12 (CYANOCOBALAMIN) 100 MCG tablet Take 100 mcg by mouth daily.  Marland Kitchen loratadine (CLARITIN) 10 MG tablet Take 10 mg by mouth daily.    . naproxen (NAPROSYN) 375 MG tablet Take 1 tablet (375 mg total) by mouth 2 (two) times daily.     Patient Active Problem List   Diagnosis Date Noted  . Acute bronchitis 01/08/2013  . Tooth abscess 02/07/2012  . Depression, major, single episode, moderate 07/01/2011    Class: Acute  . Hypotension 04/21/2011  . Dizziness 04/21/2011  . HIV (human immunodeficiency virus infection) 02/10/2011  . Acute pancreatitis 02/10/2011  . Abdominal pain, acute, epigastric 02/04/2011  . Hyperproteinemia 02/04/2011  . Proteinuria 02/04/2011  . Lymphadenopathy 02/04/2011  . Anemia 02/04/2011  . Rash and nonspecific skin eruption 02/04/2011     Health Maintenance Due    Topic Date Due  . Tetanus/tdap  09/14/1974  . Colonoscopy  09/13/2005  . Influenza Vaccine  10/05/2013     Review of Systems +right foot pain. 10 point ros is otherwise negative Physical Exam   BP 111/66  Pulse 63  Temp(Src) 98.3 F (36.8 C) (Oral)  Wt 190 lb (86.183 kg) Physical Exam  Constitutional: He is oriented to person, place, and time. He appears well-developed and well-nourished. No distress.  HENT:  Mouth/Throat: Oropharynx is clear and moist. No oropharyngeal exudate.  Cardiovascular: Normal rate, regular rhythm and normal heart sounds. Exam reveals no gallop and no friction rub.  No murmur heard.  Pulmonary/Chest: Effort normal and breath sounds normal. No respiratory distress. He has no wheezes.  Abdominal: Soft. Bowel sounds are normal. He exhibits no distension. There is no tenderness.  Lymphadenopathy:  He has no cervical adenopathy.  Neurological: He is alert and oriented to person, place, and time.  Skin: Skin is warm and dry. No rash noted. No erythema.  Ext: right foot in boot Psychiatric: He has a normal mood and affect. His behavior is normal.    Lab Results  Component Value Date   CD4TCELL 56* 10/16/2013   Lab Results  Component Value Date   CD4TABS 1300 10/16/2013   CD4TABS 920 06/04/2013   CD4TABS 830 11/07/2012   Lab Results  Component Value Date   HIV1RNAQUANT 29* 10/16/2013   Lab Results  Component Value Date   HEPBSAB POSITIVE* 02/04/2011   No results  found for this basename: RPR    CBC Lab Results  Component Value Date   WBC 4.8 10/16/2013   RBC 3.67* 10/16/2013   HGB 11.9* 10/16/2013   HCT 35.5* 10/16/2013   PLT 269 10/16/2013   MCV 96.7 10/16/2013   MCH 32.4 10/16/2013   MCHC 33.5 10/16/2013   RDW 12.9 10/16/2013   LYMPHSABS 2.3 10/16/2013   MONOABS 0.2 10/16/2013   EOSABS 0.2 10/16/2013   BASOSABS 0.0 10/16/2013   BMET Lab Results  Component Value Date   NA 137 10/16/2013   K 4.0 10/16/2013   CL 103 10/16/2013   CO2 27 10/16/2013    GLUCOSE 93 10/16/2013   BUN 14 10/16/2013   CREATININE 1.26 10/16/2013   CALCIUM 9.1 10/16/2013   GFRNONAA 70 06/04/2013   GFRAA 81 06/04/2013     Assessment and Plan   hiv = well controlled, continue with stribild  Health maintenance = to get flu vaccination today. Also will need to check rpr and lipid profile at next visit. Will also need to refer to get colonoscopy at next visit  Impotence = will do a trial of viagra

## 2014-01-13 ENCOUNTER — Ambulatory Visit: Payer: Self-pay | Admitting: Internal Medicine

## 2014-02-04 ENCOUNTER — Ambulatory Visit: Payer: Self-pay | Admitting: Internal Medicine

## 2014-02-04 ENCOUNTER — Telehealth: Payer: Self-pay | Admitting: *Deleted

## 2014-02-04 NOTE — Telephone Encounter (Signed)
Faxed application for Atripla to [facility] today.

## 2014-02-06 ENCOUNTER — Telehealth: Payer: Self-pay | Admitting: *Deleted

## 2014-02-06 NOTE — Telephone Encounter (Signed)
Pt wondering about paperwork being faxed.  He called [facility] and they have no record of receiving paperwork for Stribild assistance.  Please call pt upon your return.

## 2014-02-10 ENCOUNTER — Telehealth: Payer: Self-pay | Admitting: *Deleted

## 2014-02-10 NOTE — Telephone Encounter (Signed)
Called Nathan Wood and left him a message that he has been approved for his medication.

## 2014-03-13 ENCOUNTER — Ambulatory Visit: Payer: Self-pay | Admitting: Internal Medicine

## 2014-03-24 ENCOUNTER — Telehealth: Payer: Self-pay | Admitting: *Deleted

## 2014-03-24 NOTE — Telephone Encounter (Signed)
Received a v/m from Nathan Wood.  He says he is having problems getting his medication again.  I returned his call.  Unable to leave a message. His mailbox was full.

## 2014-04-22 ENCOUNTER — Ambulatory Visit: Payer: Self-pay | Admitting: Internal Medicine

## 2014-05-02 ENCOUNTER — Other Ambulatory Visit: Payer: Self-pay | Admitting: Licensed Clinical Social Worker

## 2014-05-02 DIAGNOSIS — B20 Human immunodeficiency virus [HIV] disease: Secondary | ICD-10-CM

## 2014-05-02 MED ORDER — ELVITEG-COBIC-EMTRICIT-TENOFDF 150-150-200-300 MG PO TABS: 1.0000 | ORAL_TABLET | Freq: Every day | ORAL | Status: DC

## 2014-05-15 ENCOUNTER — Other Ambulatory Visit: Payer: Self-pay | Admitting: *Deleted

## 2014-05-15 DIAGNOSIS — Z113 Encounter for screening for infections with a predominantly sexual mode of transmission: Secondary | ICD-10-CM

## 2014-07-15 ENCOUNTER — Telehealth: Payer: Self-pay | Admitting: Licensed Clinical Social Worker

## 2014-07-15 DIAGNOSIS — B20 Human immunodeficiency virus [HIV] disease: Secondary | ICD-10-CM

## 2014-07-15 MED ORDER — ELVITEG-COBIC-EMTRICIT-TENOFDF 150-150-200-300 MG PO TABS: 1.0000 | ORAL_TABLET | Freq: Every day | ORAL | Status: DC

## 2014-07-15 NOTE — Telephone Encounter (Signed)
Error

## 2014-08-19 ENCOUNTER — Other Ambulatory Visit: Payer: Self-pay | Admitting: *Deleted

## 2014-08-19 DIAGNOSIS — Z113 Encounter for screening for infections with a predominantly sexual mode of transmission: Secondary | ICD-10-CM

## 2014-08-19 DIAGNOSIS — B20 Human immunodeficiency virus [HIV] disease: Secondary | ICD-10-CM

## 2014-08-21 ENCOUNTER — Other Ambulatory Visit: Payer: Self-pay

## 2014-08-21 DIAGNOSIS — B20 Human immunodeficiency virus [HIV] disease: Secondary | ICD-10-CM

## 2014-08-21 DIAGNOSIS — Z113 Encounter for screening for infections with a predominantly sexual mode of transmission: Secondary | ICD-10-CM

## 2014-08-21 LAB — CBC WITH DIFFERENTIAL/PLATELET
Basophils Absolute: 0 10*3/uL (ref 0.0–0.1)
Basophils Relative: 1 % (ref 0–1)
EOS ABS: 0.2 10*3/uL (ref 0.0–0.7)
EOS PCT: 4 % (ref 0–5)
HEMATOCRIT: 38.9 % — AB (ref 39.0–52.0)
Hemoglobin: 13.1 g/dL (ref 13.0–17.0)
LYMPHS ABS: 1.7 10*3/uL (ref 0.7–4.0)
Lymphocytes Relative: 37 % (ref 12–46)
MCH: 32.3 pg (ref 26.0–34.0)
MCHC: 33.7 g/dL (ref 30.0–36.0)
MCV: 96 fL (ref 78.0–100.0)
MONOS PCT: 7 % (ref 3–12)
MPV: 10.3 fL (ref 8.6–12.4)
Monocytes Absolute: 0.3 10*3/uL (ref 0.1–1.0)
Neutro Abs: 2.3 10*3/uL (ref 1.7–7.7)
Neutrophils Relative %: 51 % (ref 43–77)
Platelets: 262 10*3/uL (ref 150–400)
RBC: 4.05 MIL/uL — ABNORMAL LOW (ref 4.22–5.81)
RDW: 13.2 % (ref 11.5–15.5)
WBC: 4.6 10*3/uL (ref 4.0–10.5)

## 2014-08-21 LAB — COMPLETE METABOLIC PANEL WITH GFR
ALK PHOS: 67 U/L (ref 39–117)
ALT: 10 U/L (ref 0–53)
AST: 15 U/L (ref 0–37)
Albumin: 3.9 g/dL (ref 3.5–5.2)
BUN: 13 mg/dL (ref 6–23)
CO2: 28 mEq/L (ref 19–32)
CREATININE: 1.23 mg/dL (ref 0.50–1.35)
Calcium: 9.2 mg/dL (ref 8.4–10.5)
Chloride: 103 mEq/L (ref 96–112)
GFR, EST NON AFRICAN AMERICAN: 64 mL/min
GFR, Est African American: 74 mL/min
Glucose, Bld: 76 mg/dL (ref 70–99)
Potassium: 3.6 mEq/L (ref 3.5–5.3)
Sodium: 138 mEq/L (ref 135–145)
Total Bilirubin: 0.4 mg/dL (ref 0.2–1.2)
Total Protein: 7.7 g/dL (ref 6.0–8.3)

## 2014-08-21 LAB — RPR

## 2014-08-22 ENCOUNTER — Other Ambulatory Visit: Payer: Self-pay | Admitting: *Deleted

## 2014-08-22 DIAGNOSIS — B20 Human immunodeficiency virus [HIV] disease: Secondary | ICD-10-CM

## 2014-08-22 LAB — HIV-1 RNA QUANT-NO REFLEX-BLD
HIV 1 RNA Quant: 28 copies/mL — ABNORMAL HIGH (ref <20)
HIV-1 RNA QUANT, LOG: 1.45 {Log} — AB (ref <1.30)

## 2014-08-22 LAB — T-HELPER CELL (CD4) - (RCID CLINIC ONLY)
CD4 T CELL HELPER: 52 % (ref 33–55)
CD4 T Cell Abs: 820 /uL (ref 400–2700)

## 2014-08-22 MED ORDER — ELVITEG-COBIC-EMTRICIT-TENOFDF 150-150-200-300 MG PO TABS: 1.0000 | ORAL_TABLET | Freq: Every day | ORAL | Status: DC

## 2014-08-26 ENCOUNTER — Other Ambulatory Visit: Payer: Self-pay | Admitting: *Deleted

## 2014-08-26 DIAGNOSIS — B20 Human immunodeficiency virus [HIV] disease: Secondary | ICD-10-CM

## 2014-08-26 MED ORDER — ELVITEG-COBIC-EMTRICIT-TENOFDF 150-150-200-300 MG PO TABS: 1.0000 | ORAL_TABLET | Freq: Every day | ORAL | Status: DC

## 2014-09-11 ENCOUNTER — Ambulatory Visit: Payer: Self-pay | Admitting: Internal Medicine

## 2014-12-08 ENCOUNTER — Ambulatory Visit: Payer: Self-pay

## 2014-12-10 ENCOUNTER — Ambulatory Visit: Payer: Self-pay

## 2014-12-11 ENCOUNTER — Ambulatory Visit: Payer: Self-pay

## 2014-12-22 ENCOUNTER — Ambulatory Visit: Payer: Self-pay

## 2015-01-05 ENCOUNTER — Ambulatory Visit (INDEPENDENT_AMBULATORY_CARE_PROVIDER_SITE_OTHER): Payer: Self-pay | Admitting: Internal Medicine

## 2015-01-05 ENCOUNTER — Encounter: Payer: Self-pay | Admitting: Internal Medicine

## 2015-01-05 ENCOUNTER — Ambulatory Visit: Payer: Self-pay

## 2015-01-05 VITALS — BP 131/83 | HR 64 | Temp 97.8°F | Wt 204.0 lb

## 2015-01-05 DIAGNOSIS — Z716 Tobacco abuse counseling: Secondary | ICD-10-CM

## 2015-01-05 DIAGNOSIS — B20 Human immunodeficiency virus [HIV] disease: Secondary | ICD-10-CM

## 2015-01-05 DIAGNOSIS — Z720 Tobacco use: Secondary | ICD-10-CM

## 2015-01-05 DIAGNOSIS — G4700 Insomnia, unspecified: Secondary | ICD-10-CM

## 2015-01-05 DIAGNOSIS — F4321 Adjustment disorder with depressed mood: Secondary | ICD-10-CM

## 2015-01-05 DIAGNOSIS — F331 Major depressive disorder, recurrent, moderate: Secondary | ICD-10-CM

## 2015-01-05 DIAGNOSIS — Z23 Encounter for immunization: Secondary | ICD-10-CM

## 2015-01-05 DIAGNOSIS — E785 Hyperlipidemia, unspecified: Secondary | ICD-10-CM

## 2015-01-05 LAB — CBC WITH DIFFERENTIAL/PLATELET
Basophils Absolute: 0 10*3/uL (ref 0.0–0.1)
Basophils Relative: 1 % (ref 0–1)
EOS PCT: 5 % (ref 0–5)
Eosinophils Absolute: 0.2 10*3/uL (ref 0.0–0.7)
HEMATOCRIT: 38 % — AB (ref 39.0–52.0)
Hemoglobin: 12.6 g/dL — ABNORMAL LOW (ref 13.0–17.0)
Lymphocytes Relative: 41 % (ref 12–46)
Lymphs Abs: 1.9 10*3/uL (ref 0.7–4.0)
MCH: 32.6 pg (ref 26.0–34.0)
MCHC: 33.2 g/dL (ref 30.0–36.0)
MCV: 98.4 fL (ref 78.0–100.0)
MONO ABS: 0.3 10*3/uL (ref 0.1–1.0)
MONOS PCT: 6 % (ref 3–12)
MPV: 10.2 fL (ref 8.6–12.4)
Neutro Abs: 2.2 10*3/uL (ref 1.7–7.7)
Neutrophils Relative %: 47 % (ref 43–77)
Platelets: 274 10*3/uL (ref 150–400)
RBC: 3.86 MIL/uL — AB (ref 4.22–5.81)
RDW: 13 % (ref 11.5–15.5)
WBC: 4.6 10*3/uL (ref 4.0–10.5)

## 2015-01-05 LAB — LIPID PANEL
CHOL/HDL RATIO: 3.8 ratio (ref <=5.0)
Cholesterol: 212 mg/dL — ABNORMAL HIGH (ref 125–200)
HDL: 56 mg/dL (ref >=40)
LDL Cholesterol: 134 mg/dL — ABNORMAL HIGH (ref <130)
TRIGLYCERIDES: 108 mg/dL (ref <150)
VLDL: 22 mg/dL (ref <30)

## 2015-01-05 LAB — COMPLETE METABOLIC PANEL WITH GFR
ALK PHOS: 80 U/L (ref 40–115)
ALT: 18 U/L (ref 9–46)
AST: 19 U/L (ref 10–35)
Albumin: 4.2 g/dL (ref 3.6–5.1)
BUN: 19 mg/dL (ref 7–25)
CALCIUM: 9.6 mg/dL (ref 8.6–10.3)
CHLORIDE: 102 mmol/L (ref 98–110)
CO2: 30 mmol/L (ref 20–31)
Creat: 1.29 mg/dL (ref 0.70–1.33)
GFR, EST NON AFRICAN AMERICAN: 60 mL/min (ref >=60)
GFR, Est African American: 70 mL/min (ref >=60)
Glucose, Bld: 89 mg/dL (ref 65–99)
POTASSIUM: 4.4 mmol/L (ref 3.5–5.3)
SODIUM: 137 mmol/L (ref 135–146)
Total Bilirubin: 0.4 mg/dL (ref 0.2–1.2)
Total Protein: 8.1 g/dL (ref 6.1–8.1)

## 2015-01-05 MED ORDER — TRAZODONE HCL 50 MG PO TABS: 50.0000 mg | ORAL_TABLET | Freq: Every evening | ORAL | Status: DC | PRN

## 2015-01-05 NOTE — Progress Notes (Signed)
Patient ID: Nathan Wood, male   DOB: 1955/03/30, 59 y.o.   MRN: [id number]       Patient ID: Nathan Wood, male   DOB: 1955/03/30, 59 y.o.   MRN: [id number]  HPI 59yo M with HIV disease, Cd 820/VL 28 on stribild (june 2016), last seen in August 2015. 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 He is still tearful, depressed with the loss of his mother. Difficulty with his brother as the executer of hte will and not dispersing belongings as dictated in the will.  He is trying not to be upset about it. Getting back on his feet. 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 Needs TB screening  No recent illnesses    Outpatient Encounter Prescriptions as of 01/05/2015  Medication Sig  . elvitegravir-cobicistat-emtricitabine-tenofovir (STRIBILD) 150-150-200-300 MG TABS tablet Take 1 tablet by mouth daily with breakfast.  . fish oil-omega-3 fatty acids 1000 MG capsule Take 2 g by mouth daily.  . fluticasone (FLONASE) 50 MCG/ACT nasal spray Place 2 sprays into both nostrils daily.  Marland Kitchen. HYDROcodone-acetaminophen (NORCO/VICODIN) 5-325 MG per tablet Take 1-2 tablets by mouth every 4 (four) hours as needed.  . loratadine (CLARITIN) 10 MG tablet Take 10 mg by mouth daily.    . naproxen (NAPROSYN) 375 MG tablet Take 1 tablet (375 mg total) by mouth 2 (two) times daily.  . sildenafil (VIAGRA) 50 MG tablet Take 0.5 tablets (25 mg total) by mouth daily as needed for erectile dysfunction.  . vitamin B-12 (CYANOCOBALAMIN) 100 MCG tablet Take 100 mcg by mouth daily.   No facility-administered encounter medications on file as of 01/05/2015.     Patient Active Problem List   Diagnosis Date Noted  . Acute bronchitis 01/08/2013  . Tooth abscess 02/07/2012  . Depression, major, single episode, moderate (HCC) 07/01/2011    Class: Acute  . Hypotension 04/21/2011  . Dizziness 04/21/2011  . HIV (human immunodeficiency virus  infection) (HCC) 02/10/2011  . Acute pancreatitis 02/10/2011  . Abdominal pain, acute, epigastric 02/04/2011  . Hyperproteinemia 02/04/2011  . Proteinuria 02/04/2011  . Lymphadenopathy 02/04/2011  . Anemia 02/04/2011  . Rash and nonspecific skin eruption 02/04/2011     Health Maintenance Due  Topic Date Due  . TETANUS/TDAP  09/14/1974  . COLONOSCOPY  09/13/2005  . INFLUENZA VACCINE  10/06/2014     Review of Systems Review of Systems  Constitutional: Negative for fever, chills, diaphoresis, activity change, appetite change, fatigue and unexpected weight change.  HENT: Negative for congestion, sore throat, rhinorrhea, sneezing, trouble swallowing and sinus pressure.  Eyes: Negative for photophobia and visual disturbance.  Respiratory: Negative for cough, chest tightness, shortness of breath, wheezing and stridor.  Cardiovascular: Negative for chest pain, palpitations and leg swelling.  Gastrointestinal: Negative for nausea, vomiting, abdominal pain, diarrhea, constipation, blood in stool, abdominal distention and anal bleeding.  Genitourinary: Negative for dysuria, hematuria, flank pain and difficulty urinating.  Musculoskeletal: Negative for myalgias, back pain, joint swelling, arthralgias and gait problem.  Skin: Negative for color change, pallor, rash and wound.  Neurological: Negative for dizziness, tremors, weakness and light-headedness.  Hematological: Negative for adenopathy. Does not bruise/bleed easily.  Psychiatric/Behavioral: +depressed, difficulty with sleeping   Physical Exam   BP 131/83 mmHg  Pulse 64  Temp(Src) 97.8 F (36.6 C) (Oral)  Wt 204 lb (92.534 kg) Physical Exam  Constitutional: He is oriented to person, place, and  time. He appears well-developed and well-nourished. No distress.  HENT:  Mouth/Throat: Oropharynx is clear and moist. No oropharyngeal exudate.  Cardiovascular: Normal rate, regular rhythm and normal heart sounds. Exam reveals no gallop  and no friction rub.  No murmur heard.  Pulmonary/Chest: Effort normal and breath sounds normal. No respiratory distress. He has no wheezes.  Abdominal: Soft. Bowel sounds are normal. He exhibits no distension. There is no tenderness.  Lymphadenopathy:  He has no cervical adenopathy.  Neurological: He is alert and oriented to person, place, and time.  Skin: Skin is warm and dry. No rash noted. No erythema.  Psychiatric: He has a normal mood and affect. His behavior is normal.    Lab Results  Component Value Date   CD4TCELL 52 08/21/2014   Lab Results  Component Value Date   CD4TABS 820 08/21/2014   CD4TABS 1300 10/16/2013   CD4TABS 920 06/04/2013   Lab Results  Component Value Date   HIV1RNAQUANT 28* 08/21/2014   Lab Results  Component Value Date   HEPBSAB POSITIVE* 02/04/2011   No results found for: RPR  CBC Lab Results  Component Value Date   WBC 4.6 08/21/2014   RBC 4.05* 08/21/2014   HGB 13.1 08/21/2014   HCT 38.9* 08/21/2014   PLT 262 08/21/2014   MCV 96.0 08/21/2014   MCH 32.3 08/21/2014   MCHC 33.7 08/21/2014   RDW 13.2 08/21/2014   LYMPHSABS 1.7 08/21/2014   MONOABS 0.3 08/21/2014   EOSABS 0.2 08/21/2014   BASOSABS 0.0 08/21/2014   BMET Lab Results  Component Value Date   NA 138 08/21/2014   K 3.6 08/21/2014   CL 103 08/21/2014   CO2 28 08/21/2014   GLUCOSE 76 08/21/2014   BUN 13 08/21/2014   CREATININE 1.23 08/21/2014   CALCIUM 9.2 08/21/2014   GFRNONAA 64 08/21/2014   GFRAA 74 08/21/2014     Assessment and Plan  HIV disease =will check labs, for now continue on stribild. He does not report a break in his meds. Again, mentioned htat he has access to meds/clinic/labs through [facility].   Situation depression/grief = loss of mother 5 months ago. Still having difficulty with process his feelings. Also scenario is complicated as his sibling is not executing the will as dictated. Frustrated, he is seeing Nathan Wood for counseling this  afternoon  Insomnia = likely related to depression. Will try low dose trazadone  HLD= continue on fish oil. Will check lipid profile today  Health maintenance = will give flu shot, also check RPR, quantiferon   Smoking cessation = currently at 1/2ppd. Spent 5 min in smoking cessation counseling  Spent greater than 50% in counseling/face to face regarding hiv disease, depression, smoking cessation

## 2015-01-05 NOTE — BH Specialist Note (Signed)
I met with Nathan Wood for the first time today and he was tearful at times, talking about how he found his 59 year old mother in her house after she died.  He also talked about the stress he has been under due to his older brother taking things from the estate, despite the fact that the will clearly states that things are to be distributed evenly with the 3 sons.  He reports poor sleep, racing thoughts, isolation, low energy, depressed mood, over-eating, poor concentration, irritability, and some anxiety.  I provided some psycho-education on mindfulness meditation and gave him some online resources to learn more about this.  He expressed interest in this and said he would give this a try to help quiet his mind and feel better.  Plan to meet in one week. Curley Spice, LCSW

## 2015-01-06 LAB — HIV-1 RNA QUANT-NO REFLEX-BLD
HIV 1 RNA Quant: 23 copies/mL — ABNORMAL HIGH (ref <20)
HIV-1 RNA Quant, Log: 1.36 Log copies/mL — ABNORMAL HIGH (ref <1.30)

## 2015-01-06 LAB — T-HELPER CELL (CD4) - (RCID CLINIC ONLY)
CD4 T CELL HELPER: 48 % (ref 33–55)
CD4 T Cell Abs: 930 /uL (ref 400–2700)

## 2015-01-06 LAB — RPR

## 2015-01-06 LAB — URINE CYTOLOGY ANCILLARY ONLY
Chlamydia: NEGATIVE
NEISSERIA GONORRHEA: NEGATIVE

## 2015-01-07 ENCOUNTER — Telehealth: Payer: Self-pay | Admitting: *Deleted

## 2015-01-07 LAB — QUANTIFERON TB GOLD ASSAY (BLOOD)
Interferon Gamma Release Assay: NEGATIVE
MITOGEN VALUE: 8.19 [IU]/mL
QUANTIFERON TB AG MINUS NIL: 0.06 [IU]/mL
Quantiferon Nil Value: 0.06 IU/mL
TB Ag value: 0.12 IU/mL

## 2015-01-07 NOTE — Telephone Encounter (Signed)
Pt asking about medication for depression, has taken Zoloft in the past.  MD please advise.  Will send message to K. Shore too.

## 2015-01-14 NOTE — Progress Notes (Signed)
[facility] notified via fax. Lukka Black M, RN   

## 2015-01-19 ENCOUNTER — Ambulatory Visit: Payer: Self-pay

## 2015-01-19 ENCOUNTER — Other Ambulatory Visit: Payer: Self-pay | Admitting: *Deleted

## 2015-01-19 DIAGNOSIS — F331 Major depressive disorder, recurrent, moderate: Secondary | ICD-10-CM

## 2015-01-19 DIAGNOSIS — B20 Human immunodeficiency virus [HIV] disease: Secondary | ICD-10-CM

## 2015-01-19 MED ORDER — ELVITEG-COBIC-EMTRICIT-TENOFDF 150-150-200-300 MG PO TABS: 1.0000 | ORAL_TABLET | Freq: Every day | ORAL | Status: DC

## 2015-01-19 NOTE — BH Specialist Note (Signed)
Dorinda HillDonald reported that he has been having anger and anxiety problems, primarily when his older brother starts arguing with him about their mother's estate.  He said the court date for a hearing on the house is next week and he is hopeful, but anxious.  I talked to him about the "fight or flight" response and taught him how to breathe from his belly.  We completed a treatment plan with goals of improving mood and coping with anger and anxiety.  Plan to meet again in 2 weeks. Franne FortsKenny Nichoel Digiulio, LCSW

## 2015-02-02 ENCOUNTER — Ambulatory Visit: Payer: Self-pay

## 2015-03-30 ENCOUNTER — Other Ambulatory Visit (INDEPENDENT_AMBULATORY_CARE_PROVIDER_SITE_OTHER): Payer: Self-pay

## 2015-03-30 ENCOUNTER — Other Ambulatory Visit: Payer: Self-pay | Admitting: Internal Medicine

## 2015-03-30 DIAGNOSIS — B20 Human immunodeficiency virus [HIV] disease: Secondary | ICD-10-CM

## 2015-03-30 LAB — CBC WITH DIFFERENTIAL/PLATELET
BASOS ABS: 0 10*3/uL (ref 0.0–0.1)
BASOS PCT: 1 % (ref 0–1)
EOS ABS: 0.1 10*3/uL (ref 0.0–0.7)
Eosinophils Relative: 4 % (ref 0–5)
HCT: 40.3 % (ref 39.0–52.0)
Hemoglobin: 13.3 g/dL (ref 13.0–17.0)
Lymphocytes Relative: 53 % — ABNORMAL HIGH (ref 12–46)
Lymphs Abs: 2 10*3/uL (ref 0.7–4.0)
MCH: 32.7 pg (ref 26.0–34.0)
MCHC: 33 g/dL (ref 30.0–36.0)
MCV: 99 fL (ref 78.0–100.0)
MONOS PCT: 6 % (ref 3–12)
MPV: 10.5 fL (ref 8.6–12.4)
Monocytes Absolute: 0.2 10*3/uL (ref 0.1–1.0)
NEUTROS ABS: 1.3 10*3/uL — AB (ref 1.7–7.7)
NEUTROS PCT: 36 % — AB (ref 43–77)
PLATELETS: 255 10*3/uL (ref 150–400)
RBC: 4.07 MIL/uL — AB (ref 4.22–5.81)
RDW: 12.9 % (ref 11.5–15.5)
WBC: 3.7 10*3/uL — AB (ref 4.0–10.5)

## 2015-03-30 LAB — COMPREHENSIVE METABOLIC PANEL
ALBUMIN: 4.1 g/dL (ref 3.6–5.1)
ALK PHOS: 63 U/L (ref 40–115)
ALT: 12 U/L (ref 9–46)
AST: 14 U/L (ref 10–35)
BILIRUBIN TOTAL: 0.4 mg/dL (ref 0.2–1.2)
BUN: 18 mg/dL (ref 7–25)
CO2: 25 mmol/L (ref 20–31)
Calcium: 9.2 mg/dL (ref 8.6–10.3)
Chloride: 103 mmol/L (ref 98–110)
Creat: 1.32 mg/dL (ref 0.70–1.33)
Glucose, Bld: 85 mg/dL (ref 65–99)
Potassium: 4.2 mmol/L (ref 3.5–5.3)
Sodium: 137 mmol/L (ref 135–146)
Total Protein: 7.7 g/dL (ref 6.1–8.1)

## 2015-03-31 LAB — HIV-1 RNA QUANT-NO REFLEX-BLD: HIV-1 RNA Quant, Log: <1.3 Log copies/mL (ref <1.30)

## 2015-04-01 LAB — T-HELPER CELL (CD4) - (RCID CLINIC ONLY)
CD4 % Helper T Cell: 51 % (ref 33–55)
CD4 T Cell Abs: 990 /uL (ref 400–2700)

## 2015-04-02 ENCOUNTER — Other Ambulatory Visit: Payer: Self-pay

## 2015-04-16 ENCOUNTER — Ambulatory Visit: Payer: Self-pay | Admitting: Internal Medicine

## 2015-05-11 ENCOUNTER — Ambulatory Visit: Payer: Self-pay | Admitting: Internal Medicine

## 2015-09-25 ENCOUNTER — Other Ambulatory Visit: Payer: Self-pay | Admitting: Internal Medicine

## 2015-10-07 ENCOUNTER — Encounter: Payer: Self-pay | Admitting: Internal Medicine

## 2015-10-07 ENCOUNTER — Ambulatory Visit (INDEPENDENT_AMBULATORY_CARE_PROVIDER_SITE_OTHER): Payer: Self-pay | Admitting: Internal Medicine

## 2015-10-07 VITALS — Wt 201.0 lb

## 2015-10-07 DIAGNOSIS — B20 Human immunodeficiency virus [HIV] disease: Secondary | ICD-10-CM

## 2015-10-07 DIAGNOSIS — Z113 Encounter for screening for infections with a predominantly sexual mode of transmission: Secondary | ICD-10-CM

## 2015-10-07 DIAGNOSIS — F4323 Adjustment disorder with mixed anxiety and depressed mood: Secondary | ICD-10-CM

## 2015-10-07 DIAGNOSIS — G4700 Insomnia, unspecified: Secondary | ICD-10-CM

## 2015-10-07 LAB — CBC WITH DIFFERENTIAL/PLATELET
BASOS ABS: 72 {cells}/uL (ref 0–200)
Basophils Relative: 2 %
EOS ABS: 180 {cells}/uL (ref 15–500)
Eosinophils Relative: 5 %
HEMATOCRIT: 38.5 % (ref 38.5–50.0)
Hemoglobin: 12.3 g/dL — ABNORMAL LOW (ref 13.2–17.1)
Lymphocytes Relative: 50 %
Lymphs Abs: 1800 cells/uL (ref 850–3900)
MCH: 31.8 pg (ref 27.0–33.0)
MCHC: 31.9 g/dL — ABNORMAL LOW (ref 32.0–36.0)
MCV: 99.5 fL (ref 80.0–100.0)
MONO ABS: 216 {cells}/uL (ref 200–950)
MONOS PCT: 6 %
MPV: 10.8 fL (ref 7.5–12.5)
NEUTROS ABS: 1332 {cells}/uL — AB (ref 1500–7800)
Neutrophils Relative %: 37 %
PLATELETS: 245 10*3/uL (ref 140–400)
RBC: 3.87 MIL/uL — ABNORMAL LOW (ref 4.20–5.80)
RDW: 13.2 % (ref 11.0–15.0)
WBC: 3.6 10*3/uL — ABNORMAL LOW (ref 3.8–10.8)

## 2015-10-07 LAB — COMPLETE METABOLIC PANEL WITH GFR
ALT: 14 U/L (ref 9–46)
AST: 19 U/L (ref 10–35)
Albumin: 3.9 g/dL (ref 3.6–5.1)
Alkaline Phosphatase: 74 U/L (ref 40–115)
BILIRUBIN TOTAL: 0.5 mg/dL (ref 0.2–1.2)
BUN: 18 mg/dL (ref 7–25)
CHLORIDE: 105 mmol/L (ref 98–110)
CO2: 28 mmol/L (ref 20–31)
Calcium: 9.2 mg/dL (ref 8.6–10.3)
Creat: 1.29 mg/dL — ABNORMAL HIGH (ref 0.70–1.25)
GFR, EST NON AFRICAN AMERICAN: 60 mL/min (ref >=60)
GFR, Est African American: 69 mL/min (ref >=60)
GLUCOSE: 82 mg/dL (ref 65–99)
Potassium: 5 mmol/L (ref 3.5–5.3)
SODIUM: 139 mmol/L (ref 135–146)
TOTAL PROTEIN: 7.5 g/dL (ref 6.1–8.1)

## 2015-10-07 MED ORDER — ELVITEG-COBIC-EMTRICIT-TENOFAF 150-150-200-10 MG PO TABS: 1.0000 | ORAL_TABLET | Freq: Every day | ORAL | 11 refills | Status: DC

## 2015-10-07 MED ORDER — PAROXETINE HCL 10 MG PO TABS: 10.0000 mg | ORAL_TABLET | Freq: Every day | ORAL | 11 refills | Status: DC

## 2015-10-07 NOTE — Progress Notes (Signed)
Patient ID: Nathan Wood, male   DOB: 1955-05-24, 60 y.o.   MRN: [id number]  HPI Nathan Wood, 60yo M with history of well controlled HIV disease, CD 4 count 990/VL<20 (in January) on stribild. He has not been seen since last Fall, where he was dealing with inheritence issues with his family members since his mother had past away. He states that he attended counseling sessions with Bernette Redbird for a few months. He is trying to let it go and not worry too much about finances associated with his mother's estate. 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 He still occasionally get anxious/angry in certain scenarios. He is open to starting SSRI  No sex since we last saw him  Outpatient Encounter Prescriptions as of 10/07/2015  Medication Sig  . fish oil-omega-3 fatty acids 1000 MG capsule Take 2 g by mouth daily.  . STRIBILD 150-150-200-300 MG TABS tablet TAKE 1 TABLET BY MOUTH DAILY WITH BREAKFAST  . traZODone (DESYREL) 50 MG tablet Take 1 tablet (50 mg total) by mouth at bedtime as needed for sleep.  . vitamin B-12 (CYANOCOBALAMIN) 100 MCG tablet Take 100 mcg by mouth daily.   No facility-administered encounter medications on file as of 10/07/2015.      Patient Active Problem List   Diagnosis Date Noted  . Acute bronchitis 01/08/2013  . Tooth abscess 02/07/2012  . Depression, major, single episode, moderate (HCC) 07/01/2011    Class: Acute  . Hypotension 04/21/2011  . Dizziness 04/21/2011  . HIV (human immunodeficiency virus infection) (HCC) 02/10/2011  . Acute pancreatitis 02/10/2011  . Abdominal pain, acute, epigastric 02/04/2011  . Hyperproteinemia 02/04/2011  . Proteinuria 02/04/2011  . Lymphadenopathy 02/04/2011  . Anemia 02/04/2011  . Rash and nonspecific skin eruption 02/04/2011     Health Maintenance Due  Topic Date Due  . TETANUS/TDAP  09/14/1974  . COLONOSCOPY  09/13/2005  . ZOSTAVAX  09/14/2015  . INFLUENZA VACCINE  10/06/2015      Review of Systems  Constitutional: Negative for fever, chills, diaphoresis, activity change, appetite change, fatigue and unexpected weight change.  HENT: Negative for congestion, sore throat, rhinorrhea, sneezing, trouble swallowing and sinus pressure.  Eyes: Negative for photophobia and visual disturbance.  Respiratory: Negative for cough, chest tightness, shortness of breath, wheezing and stridor.  Cardiovascular: Negative for chest pain, palpitations and leg swelling.  Gastrointestinal: Negative for nausea, vomiting, abdominal pain, diarrhea, constipation, blood in stool, abdominal distention and anal bleeding.  Genitourinary: Negative for dysuria, hematuria, flank pain and difficulty urinating.  Musculoskeletal: Negative for myalgias, back pain, joint swelling, arthralgias and gait problem.  Skin: Negative for color change, pallor, rash and wound.  Neurological: Negative for dizziness, tremors, weakness and light-headedness.  Hematological: Negative for adenopathy. Does not bruise/bleed easily.  Psychiatric/Behavioral: + anxiety. +poor sleep.Negative for behavioral problems, confusion, sleep disturbance, dysphoric mood, decreased concentration and agitation.    Physical Exam   BP (P) 115/73   Pulse (!) (P) 59   Temp (P) 97.6 F (36.4 C)   Wt 201 lb (91.2 kg)   BMI 25.81 kg/m  Physical Exam  Constitutional: He is oriented to person, place, and time. He appears well-developed and well-nourished. No distress.  HENT:  Mouth/Throat: Oropharynx is clear and moist. No oropharyngeal exudate. Poor dentition Cardiovascular: Normal rate, regular rhythm and normal heart sounds. Exam reveals no gallop and no friction rub.  No murmur heard.  Pulmonary/Chest: Effort normal and breath sounds normal. No respiratory  distress. He has no wheezes.  Abdominal: Soft. Bowel sounds are normal. He exhibits no distension. There is no tenderness.  Lymphadenopathy:  He has no cervical adenopathy.    Neurological: He is alert and oriented to person, place, and time.  Skin: Skin is warm and dry. No rash noted. No erythema.  Psychiatric: He has a normal mood and affect. His behavior is normal.     Lab Results  Component Value Date   CD4TCELL 51 03/30/2015   Lab Results  Component Value Date   CD4TABS 990 03/30/2015   CD4TABS 930 01/05/2015   CD4TABS 820 08/21/2014   Lab Results  Component Value Date   HIV1RNAQUANT <20 03/30/2015   Lab Results  Component Value Date   HEPBSAB POSITIVE (A) 02/04/2011   No results found for: RPR  CBC Lab Results  Component Value Date   WBC 3.7 (L) 03/30/2015   RBC 4.07 (L) 03/30/2015   HGB 13.3 03/30/2015   HCT 40.3 03/30/2015   PLT 255 03/30/2015   MCV 99.0 03/30/2015   MCH 32.7 03/30/2015   MCHC 33.0 03/30/2015   RDW 12.9 03/30/2015   LYMPHSABS 2.0 03/30/2015   MONOABS 0.2 03/30/2015   EOSABS 0.1 03/30/2015   BASOSABS 0.0 03/30/2015   BMET Lab Results  Component Value Date   NA 137 03/30/2015   K 4.2 03/30/2015   CL 103 03/30/2015   CO2 25 03/30/2015   GLUCOSE 85 03/30/2015   BUN 18 03/30/2015   CREATININE 1.32 03/30/2015   CALCIUM 9.2 03/30/2015   GFRNONAA 60 01/05/2015   GFRAA 70 01/05/2015     Assessment and Plan  hiv disease = will get labs today. And plan to switch to genvoya to start next month  constipation = start using colace and bisacodyl regularly  Anxiety = will start on paxil  for 2 wk then increase to  daily. Encouraged him also to consider ongoing counseling  Health maintenance = will check rpr, not sexually active in the last 9 months. Will get flu vaccine at next visit. Will need  booster of pneumococcal this dec 2017  Insomnia = continue with low dose trazodone  rtc in 2 months to assess anxiety

## 2015-10-08 LAB — RPR

## 2015-10-08 LAB — T-HELPER CELL (CD4) - (RCID CLINIC ONLY)
CD4 % Helper T Cell: 49 % (ref 33–55)
CD4 T CELL ABS: 860 /uL (ref 400–2700)

## 2015-10-09 LAB — HIV-1 RNA QUANT-NO REFLEX-BLD
HIV 1 RNA Quant: 118 copies/mL — ABNORMAL HIGH (ref <20)
HIV-1 RNA Quant, Log: 2.07 Log copies/mL — ABNORMAL HIGH (ref <1.30)

## 2015-10-29 ENCOUNTER — Ambulatory Visit: Payer: Self-pay | Admitting: Internal Medicine

## 2015-11-02 ENCOUNTER — Other Ambulatory Visit: Payer: Self-pay | Admitting: Internal Medicine

## 2015-11-02 ENCOUNTER — Encounter: Payer: Self-pay | Admitting: Internal Medicine

## 2015-11-02 DIAGNOSIS — B20 Human immunodeficiency virus [HIV] disease: Secondary | ICD-10-CM

## 2015-11-05 ENCOUNTER — Telehealth: Payer: Self-pay | Admitting: *Deleted

## 2015-11-05 ENCOUNTER — Ambulatory Visit: Payer: Self-pay

## 2015-11-05 ENCOUNTER — Other Ambulatory Visit: Payer: Self-pay | Admitting: Internal Medicine

## 2015-11-05 DIAGNOSIS — K5901 Slow transit constipation: Secondary | ICD-10-CM

## 2015-11-05 DIAGNOSIS — F411 Generalized anxiety disorder: Secondary | ICD-10-CM

## 2015-11-05 MED ORDER — LACTULOSE 10 GM/15ML PO SOLN: 20.0000 g | Freq: Every day | ORAL | Status: DC | PRN

## 2015-11-05 NOTE — Telephone Encounter (Signed)
Patient called and advised he spoke with provider about laxative Rx. He was checking to see if it had been sent to the pharmacy yet. Advised no but will remind provider and call him once she does.

## 2015-11-05 NOTE — BH Specialist Note (Signed)
Nathan Wood came in wearing a bandage just under his knee, reporting that he hurt it on a trampoline at his niece's birthday party. He reports that he continues to have some depression, mainly because of the fact that he cannot find work in [place], [place], where he lives. A former employer gave a negative reference to a potential employer, which was upsetting, he said. He said he is sleeping well, thanks to Trazadone. He is also taking Paxil and he can tell that it helps. We updated his treatment plan.  Plan to meet again next week. Franne FortsKenny Deborrah Mabin, LCSW

## 2015-11-06 ENCOUNTER — Other Ambulatory Visit: Payer: Self-pay | Admitting: *Deleted

## 2015-11-06 DIAGNOSIS — K5900 Constipation, unspecified: Secondary | ICD-10-CM

## 2015-11-06 MED ORDER — LACTULOSE 10 GM/15ML PO SOLN: 10.0000 g | Freq: Every day | ORAL | 0 refills | Status: DC | PRN

## 2015-11-10 ENCOUNTER — Ambulatory Visit: Payer: Self-pay

## 2015-12-14 ENCOUNTER — Ambulatory Visit: Payer: Self-pay | Admitting: Internal Medicine

## 2015-12-24 ENCOUNTER — Ambulatory Visit: Payer: Self-pay

## 2015-12-24 DIAGNOSIS — F331 Major depressive disorder, recurrent, moderate: Secondary | ICD-10-CM

## 2015-12-24 NOTE — BH Specialist Note (Signed)
Nathan HillDonald was tearful at times today, talking about his situation and how frustrated he is living in [place], [place] with no job and no transportation.  He reports increased anxiety and I provided some psycho-education on mindfulness and walking meditation. I also told him about the Oprah Winfrey/Deepak Chopra 21 day meditation available online and he seemed interested in this. I also praised him for considering going back to school and he said the community college is right down the road.  Plan to meet again when he is able. Franne FortsKenny Lindsay Soulliere, LCSW

## 2016-01-19 ENCOUNTER — Other Ambulatory Visit: Payer: Self-pay | Admitting: Internal Medicine

## 2016-01-19 DIAGNOSIS — G4700 Insomnia, unspecified: Secondary | ICD-10-CM

## 2016-02-09 ENCOUNTER — Encounter: Payer: Self-pay | Admitting: Internal Medicine

## 2016-02-09 ENCOUNTER — Ambulatory Visit (INDEPENDENT_AMBULATORY_CARE_PROVIDER_SITE_OTHER): Payer: Self-pay | Admitting: Internal Medicine

## 2016-02-09 VITALS — BP 110/72 | HR 76 | Temp 97.9°F | Wt 217.0 lb

## 2016-02-09 DIAGNOSIS — Z23 Encounter for immunization: Secondary | ICD-10-CM

## 2016-02-09 DIAGNOSIS — B20 Human immunodeficiency virus [HIV] disease: Secondary | ICD-10-CM

## 2016-02-09 DIAGNOSIS — F4323 Adjustment disorder with mixed anxiety and depressed mood: Secondary | ICD-10-CM

## 2016-02-09 LAB — COMPLETE METABOLIC PANEL WITH GFR
ALBUMIN: 4.1 g/dL (ref 3.6–5.1)
ALK PHOS: 67 U/L (ref 40–115)
ALT: 14 U/L (ref 9–46)
AST: 19 U/L (ref 10–35)
BILIRUBIN TOTAL: 0.3 mg/dL (ref 0.2–1.2)
BUN: 18 mg/dL (ref 7–25)
CO2: 29 mmol/L (ref 20–31)
CREATININE: 1.3 mg/dL — AB (ref 0.70–1.25)
Calcium: 9.4 mg/dL (ref 8.6–10.3)
Chloride: 105 mmol/L (ref 98–110)
GFR, EST AFRICAN AMERICAN: 69 mL/min (ref >=60)
GFR, EST NON AFRICAN AMERICAN: 59 mL/min — AB (ref >=60)
GLUCOSE: 90 mg/dL (ref 65–99)
Potassium: 4 mmol/L (ref 3.5–5.3)
SODIUM: 141 mmol/L (ref 135–146)
TOTAL PROTEIN: 7.3 g/dL (ref 6.1–8.1)

## 2016-02-09 LAB — CBC WITH DIFFERENTIAL/PLATELET
BASOS ABS: 42 {cells}/uL (ref 0–200)
BASOS PCT: 1 %
EOS ABS: 252 {cells}/uL (ref 15–500)
Eosinophils Relative: 6 %
HCT: 38.3 % — ABNORMAL LOW (ref 38.5–50.0)
HEMOGLOBIN: 12.3 g/dL — AB (ref 13.2–17.1)
LYMPHS ABS: 2184 {cells}/uL (ref 850–3900)
Lymphocytes Relative: 52 %
MCH: 32.5 pg (ref 27.0–33.0)
MCHC: 32.1 g/dL (ref 32.0–36.0)
MCV: 101.3 fL — AB (ref 80.0–100.0)
MONOS PCT: 6 %
MPV: 10.5 fL (ref 7.5–12.5)
Monocytes Absolute: 252 cells/uL (ref 200–950)
NEUTROS ABS: 1470 {cells}/uL — AB (ref 1500–7800)
Neutrophils Relative %: 35 %
PLATELETS: 266 10*3/uL (ref 140–400)
RBC: 3.78 MIL/uL — ABNORMAL LOW (ref 4.20–5.80)
RDW: 13.1 % (ref 11.0–15.0)
WBC: 4.2 10*3/uL (ref 3.8–10.8)

## 2016-02-09 MED ORDER — PAROXETINE HCL 20 MG PO TABS: 20.0000 mg | ORAL_TABLET | Freq: Every day | ORAL | 11 refills | Status: DC

## 2016-02-09 MED ORDER — ELVITEG-COBIC-EMTRICIT-TENOFAF 150-150-200-10 MG PO TABS: 1.0000 | ORAL_TABLET | Freq: Every day | ORAL | 11 refills | Status: DC

## 2016-02-09 NOTE — Progress Notes (Signed)
Patient ID: Nathan Wood, male   DOB: 06/08/1955, [age] y.o.   MRN: [id number]  HPI 60yo M with advanced hiv disease, CD 4 count of 860/VL 118 - on stribild   We last saw him in early august where he was started on paxil 20mg  though he staid that the prescription never came through. We were not aware of it Outpatient Encounter Prescriptions as of 02/09/2016  Medication Sig  . fish oil-omega-3 fatty acids 1000 MG capsule Take 2 g by mouth daily.  Marland Kitchen. PARoxetine (PAXIL) 10 MG tablet Take 1 tablet (10 mg total) by mouth daily.  . STRIBILD 150-150-200-300 MG TABS tablet TAKE 1 TABLET BY MOUTH DAILY WITH BREAKFAST  . traZODone (DESYREL) 50 MG tablet TAKE 1 TABLET(50 MG) BY MOUTH AT BEDTIME AS NEEDED FOR SLEEP  . vitamin B-12 (CYANOCOBALAMIN) 100 MCG tablet Take 100 mcg by mouth daily.  Marland Kitchen. elvitegravir-cobicistat-emtricitabine-tenofovir (GENVOYA) 150-150-200-10 MG TABS tablet Take 1 tablet by mouth daily with breakfast. (Patient not taking: Reported on 02/09/2016)  . lactulose (CHRONULAC) 10 GM/15ML solution Take 15 mLs (10 g total) by mouth daily as needed for mild constipation (until desired movement occurs). (Patient not taking: Reported on 02/09/2016)   Facility-Administered Encounter Medications as of 02/09/2016  Medication  . lactulose (CHRONULAC) 10 GM/15ML solution 20 g     Patient Active Problem List   Diagnosis Date Noted  . Acute bronchitis 01/08/2013  . Tooth abscess 02/07/2012  . Depression, major, single episode, moderate (HCC) 07/01/2011    Class: Acute  . Hypotension 04/21/2011  . Dizziness 04/21/2011  . HIV (human immunodeficiency virus infection) (HCC) 02/10/2011  . Acute pancreatitis 02/10/2011  . Abdominal pain, acute, epigastric 02/04/2011  . Hyperproteinemia 02/04/2011  . Proteinuria 02/04/2011  . Lymphadenopathy 02/04/2011  . Anemia 02/04/2011  . Rash and nonspecific skin eruption 02/04/2011     Health Maintenance Due  Topic Date Due  . TETANUS/TDAP   09/14/1974  . COLONOSCOPY  09/13/2005  . ZOSTAVAX  09/14/2015  . INFLUENZA VACCINE  10/06/2015     Review of Systems Review of Systems  Constitutional: Negative for fever, chills, diaphoresis, activity change, appetite change, fatigue and unexpected weight change.  HENT: Negative for congestion, sore throat, rhinorrhea, sneezing, trouble swallowing and sinus pressure.  Eyes: Negative for photophobia and visual disturbance.  Respiratory: Negative for cough, chest tightness, shortness of breath, wheezing and stridor.  Cardiovascular: Negative for chest pain, palpitations and leg swelling.  Gastrointestinal: Negative for nausea, vomiting, abdominal pain, diarrhea, constipation, blood in stool, abdominal distention and anal bleeding.  Genitourinary: Negative for dysuria, hematuria, flank pain and difficulty urinating.  Musculoskeletal: Negative for myalgias, back pain, joint swelling, arthralgias and gait problem.  Skin: Negative for color change, pallor, rash and wound.  Neurological: Negative for dizziness, tremors, weakness and light-headedness.  Hematological: Negative for adenopathy. Does not bruise/bleed easily.  Psychiatric/Behavioral: Negative for behavioral problems, confusion, sleep disturbance, dysphoric mood, decreased concentration and agitation.   except mild depression  Physical Exam   BP 110/72   Pulse 76   Temp 97.9 F (36.6 C) (Oral)   Wt 217 lb (98.4 kg)   SpO2 97%   BMI 27.86 kg/m  Physical Exam  Constitutional: He is oriented to person, place, and time. He appears well-developed and well-nourished. No distress.  HENT: poor dentition Mouth/Throat: Oropharynx is clear and moist. No oropharyngeal exudate.  Cardiovascular: Normal rate, regular rhythm and normal heart sounds. Exam reveals no gallop and no friction rub.  No murmur heard.  Pulmonary/Chest: Effort normal and breath sounds normal. No respiratory distress. He has no wheezes.  Abdominal: Soft. Bowel  sounds are normal. He exhibits no distension. There is no tenderness.  Lymphadenopathy:  He has no cervical adenopathy.  Neurological: He is alert and oriented to person, place, and time.  Skin: Skin is warm and dry. No rash noted. No erythema.  Psychiatric: He has a normal mood and affect. His behavior is normal.    Lab Results  Component Value Date   CD4TCELL 49 10/07/2015   Lab Results  Component Value Date   CD4TABS 860 10/07/2015   CD4TABS 990 03/30/2015   CD4TABS 930 01/05/2015   Lab Results  Component Value Date   HIV1RNAQUANT 118 (H) 10/07/2015   Lab Results  Component Value Date   HEPBSAB POSITIVE (A) 02/04/2011   No results found for: RPR  CBC Lab Results  Component Value Date   WBC 3.6 (L) 10/07/2015   RBC 3.87 (L) 10/07/2015   HGB 12.3 (L) 10/07/2015   HCT 38.5 10/07/2015   PLT 245 10/07/2015   MCV 99.5 10/07/2015   MCH 31.8 10/07/2015   MCHC 31.9 (L) 10/07/2015   RDW 13.2 10/07/2015   LYMPHSABS 1,800 10/07/2015   MONOABS 216 10/07/2015   EOSABS 180 10/07/2015   BASOSABS 72 10/07/2015   BMET Lab Results  Component Value Date   NA 139 10/07/2015   K 5.0 10/07/2015   CL 105 10/07/2015   CO2 28 10/07/2015   GLUCOSE 82 10/07/2015   BUN 18 10/07/2015   CREATININE 1.29 (H) 10/07/2015   CALCIUM 9.2 10/07/2015   GFRNONAA 60 10/07/2015   GFRAA 69 10/07/2015     Assessment and Plan  hiv = will check labs and change to genvoya  Health maintenance = flu vacccine plus pneumococcal booster today. Next visit will get meningococcal.  ckd 2 = continue to monitor  Anxiety = will do paxil 10mg  x 2 wk then increase to 20mg  daily

## 2016-02-11 LAB — T-HELPER CELL (CD4) - (RCID CLINIC ONLY)
CD4 % Helper T Cell: 53 % (ref 33–55)
CD4 T CELL ABS: 1250 /uL (ref 400–2700)

## 2016-02-12 LAB — HIV-1 RNA QUANT-NO REFLEX-BLD
HIV 1 RNA QUANT: 69 {copies}/mL — AB (ref <20)
HIV-1 RNA Quant, Log: 1.84 Log copies/mL — ABNORMAL HIGH (ref <1.30)

## 2016-04-02 ENCOUNTER — Other Ambulatory Visit: Payer: Self-pay | Admitting: Internal Medicine

## 2016-04-02 DIAGNOSIS — G4700 Insomnia, unspecified: Secondary | ICD-10-CM

## 2016-04-12 ENCOUNTER — Other Ambulatory Visit: Payer: Self-pay | Admitting: Internal Medicine

## 2016-04-12 DIAGNOSIS — B20 Human immunodeficiency virus [HIV] disease: Secondary | ICD-10-CM

## 2016-04-20 ENCOUNTER — Other Ambulatory Visit (INDEPENDENT_AMBULATORY_CARE_PROVIDER_SITE_OTHER): Payer: Self-pay

## 2016-04-20 DIAGNOSIS — Z79899 Other long term (current) drug therapy: Secondary | ICD-10-CM

## 2016-04-20 DIAGNOSIS — Z113 Encounter for screening for infections with a predominantly sexual mode of transmission: Secondary | ICD-10-CM

## 2016-04-20 DIAGNOSIS — B20 Human immunodeficiency virus [HIV] disease: Secondary | ICD-10-CM

## 2016-04-20 LAB — CBC WITH DIFFERENTIAL/PLATELET
Basophils Absolute: 36 cells/uL (ref 0–200)
Basophils Relative: 1 %
EOS ABS: 216 {cells}/uL (ref 15–500)
EOS PCT: 6 %
HEMATOCRIT: 39.1 % (ref 38.5–50.0)
HEMOGLOBIN: 12.7 g/dL — AB (ref 13.2–17.1)
LYMPHS PCT: 53 %
Lymphs Abs: 1908 cells/uL (ref 850–3900)
MCH: 32.6 pg (ref 27.0–33.0)
MCHC: 32.5 g/dL (ref 32.0–36.0)
MCV: 100.3 fL — ABNORMAL HIGH (ref 80.0–100.0)
MONOS PCT: 6 %
MPV: 10.6 fL (ref 7.5–12.5)
Monocytes Absolute: 216 cells/uL (ref 200–950)
Neutro Abs: 1224 cells/uL — ABNORMAL LOW (ref 1500–7800)
Neutrophils Relative %: 34 %
Platelets: 231 10*3/uL (ref 140–400)
RBC: 3.9 MIL/uL — ABNORMAL LOW (ref 4.20–5.80)
RDW: 12.8 % (ref 11.0–15.0)
WBC: 3.6 10*3/uL — AB (ref 3.8–10.8)

## 2016-04-20 LAB — LIPID PANEL
CHOL/HDL RATIO: 3.9 ratio (ref <5.0)
Cholesterol: 171 mg/dL (ref <200)
HDL: 44 mg/dL (ref >40)
LDL Cholesterol: 96 mg/dL (ref <100)
Triglycerides: 154 mg/dL — ABNORMAL HIGH (ref <150)
VLDL: 31 mg/dL — AB (ref <30)

## 2016-04-20 LAB — COMPREHENSIVE METABOLIC PANEL
ALK PHOS: 63 U/L (ref 40–115)
ALT: 8 U/L — AB (ref 9–46)
AST: 14 U/L (ref 10–35)
Albumin: 4 g/dL (ref 3.6–5.1)
BUN: 16 mg/dL (ref 7–25)
CALCIUM: 9.4 mg/dL (ref 8.6–10.3)
CHLORIDE: 105 mmol/L (ref 98–110)
CO2: 30 mmol/L (ref 20–31)
Creat: 1.21 mg/dL (ref 0.70–1.25)
GLUCOSE: 76 mg/dL (ref 65–99)
POTASSIUM: 4.4 mmol/L (ref 3.5–5.3)
Sodium: 142 mmol/L (ref 135–146)
Total Bilirubin: 0.4 mg/dL (ref 0.2–1.2)
Total Protein: 7.2 g/dL (ref 6.1–8.1)

## 2016-04-21 LAB — URINE CYTOLOGY ANCILLARY ONLY
Chlamydia: NEGATIVE
Neisseria Gonorrhea: NEGATIVE

## 2016-04-22 LAB — T-HELPER CELL (CD4) - (RCID CLINIC ONLY)
CD4 % Helper T Cell: 54 % (ref 33–55)
CD4 T Cell Abs: 1010 /uL (ref 400–2700)

## 2016-04-25 ENCOUNTER — Other Ambulatory Visit: Payer: Self-pay

## 2016-04-27 LAB — HIV-1 RNA QUANT-NO REFLEX-BLD
HIV 1 RNA Quant: 183 copies/mL — ABNORMAL HIGH
HIV-1 RNA QUANT, LOG: 2.26 {Log_copies}/mL — AB

## 2016-04-28 ENCOUNTER — Encounter: Payer: Self-pay | Admitting: Internal Medicine

## 2016-05-10 ENCOUNTER — Ambulatory Visit: Payer: Self-pay

## 2016-05-10 ENCOUNTER — Ambulatory Visit: Payer: Self-pay | Admitting: Internal Medicine

## 2016-06-01 ENCOUNTER — Ambulatory Visit: Payer: Self-pay | Admitting: Internal Medicine

## 2016-07-07 ENCOUNTER — Ambulatory Visit: Payer: Self-pay | Admitting: Internal Medicine

## 2016-07-12 ENCOUNTER — Ambulatory Visit: Payer: Self-pay | Admitting: Internal Medicine

## 2016-08-11 ENCOUNTER — Ambulatory Visit: Payer: Self-pay | Admitting: Internal Medicine

## 2016-09-24 ENCOUNTER — Other Ambulatory Visit: Payer: Self-pay | Admitting: Internal Medicine

## 2016-09-24 DIAGNOSIS — G4700 Insomnia, unspecified: Secondary | ICD-10-CM

## 2016-10-24 ENCOUNTER — Other Ambulatory Visit: Payer: Self-pay | Admitting: Internal Medicine

## 2016-10-24 DIAGNOSIS — G4700 Insomnia, unspecified: Secondary | ICD-10-CM

## 2016-10-25 ENCOUNTER — Ambulatory Visit: Payer: Self-pay | Admitting: Internal Medicine

## 2017-03-22 ENCOUNTER — Ambulatory Visit: Payer: Self-pay

## 2017-03-23 ENCOUNTER — Ambulatory Visit: Payer: Self-pay

## 2017-04-05 ENCOUNTER — Ambulatory Visit: Payer: Self-pay

## 2017-04-06 ENCOUNTER — Ambulatory Visit: Payer: Self-pay

## 2017-04-10 ENCOUNTER — Ambulatory Visit: Payer: Self-pay

## 2017-05-04 ENCOUNTER — Telehealth: Payer: Self-pay | Admitting: *Deleted

## 2017-05-04 NOTE — Telephone Encounter (Signed)
[facility] number for our office to send us a release for this patient. Noted he has not been here in 1 year, has multiple cancelled appointment and no-shows.  RN attempted to call, no answer, no voicemail.  Was detectable at last labs 1 year ago. Will send to [facility]. Andree CossHowell, Patrcia Schnepp M, RN

## 2017-05-31 ENCOUNTER — Other Ambulatory Visit: Payer: Self-pay | Admitting: *Deleted

## 2017-05-31 DIAGNOSIS — Z113 Encounter for screening for infections with a predominantly sexual mode of transmission: Secondary | ICD-10-CM

## 2017-05-31 DIAGNOSIS — B20 Human immunodeficiency virus [HIV] disease: Secondary | ICD-10-CM

## 2017-06-01 ENCOUNTER — Encounter: Payer: Self-pay | Admitting: *Deleted

## 2017-06-01 ENCOUNTER — Telehealth: Payer: Self-pay | Admitting: *Deleted

## 2017-06-01 NOTE — Telephone Encounter (Signed)
Patient reestablishing care via [facility]. Needs letter from MD to maintain housing stating that he is unable to work due to an acute flare of a chronic medical condition. OK per Dr Drue SecondSnider. Andree CossHowell, Yaritsa Savarino M, RN

## 2017-06-05 ENCOUNTER — Other Ambulatory Visit: Payer: Self-pay

## 2017-06-05 DIAGNOSIS — B20 Human immunodeficiency virus [HIV] disease: Secondary | ICD-10-CM

## 2017-06-05 DIAGNOSIS — Z113 Encounter for screening for infections with a predominantly sexual mode of transmission: Secondary | ICD-10-CM

## 2017-06-06 LAB — COMPLETE METABOLIC PANEL WITH GFR
AG Ratio: 1.3 (calc) (ref 1.0–2.5)
ALBUMIN MSPROF: 4.5 g/dL (ref 3.6–5.1)
ALT: 11 U/L (ref 9–46)
AST: 14 U/L (ref 10–35)
Alkaline phosphatase (APISO): 71 U/L (ref 40–115)
BUN / CREAT RATIO: 18 (calc) (ref 6–22)
BUN: 24 mg/dL (ref 7–25)
CALCIUM: 9.8 mg/dL (ref 8.6–10.3)
CO2: 28 mmol/L (ref 20–32)
Chloride: 102 mmol/L (ref 98–110)
Creat: 1.3 mg/dL — ABNORMAL HIGH (ref 0.70–1.25)
GFR, EST AFRICAN AMERICAN: 68 mL/min/{1.73_m2} (ref >=60)
GFR, EST NON AFRICAN AMERICAN: 59 mL/min/{1.73_m2} — AB (ref >=60)
Globulin: 3.6 g/dL (calc) (ref 1.9–3.7)
Glucose, Bld: 89 mg/dL (ref 65–99)
POTASSIUM: 3.9 mmol/L (ref 3.5–5.3)
Sodium: 139 mmol/L (ref 135–146)
TOTAL PROTEIN: 8.1 g/dL (ref 6.1–8.1)
Total Bilirubin: 0.3 mg/dL (ref 0.2–1.2)

## 2017-06-06 LAB — CBC WITH DIFFERENTIAL/PLATELET
Basophils Absolute: 28 cells/uL (ref 0–200)
Basophils Relative: 0.6 %
EOS PCT: 4 %
Eosinophils Absolute: 188 cells/uL (ref 15–500)
HEMATOCRIT: 39.6 % (ref 38.5–50.0)
Hemoglobin: 13.8 g/dL (ref 13.2–17.1)
LYMPHS ABS: 2134 {cells}/uL (ref 850–3900)
MCH: 33.3 pg — ABNORMAL HIGH (ref 27.0–33.0)
MCHC: 34.8 g/dL (ref 32.0–36.0)
MCV: 95.7 fL (ref 80.0–100.0)
MONOS PCT: 5.9 %
MPV: 11.6 fL (ref 7.5–12.5)
NEUTROS ABS: 2073 {cells}/uL (ref 1500–7800)
NEUTROS PCT: 44.1 %
Platelets: 231 10*3/uL (ref 140–400)
RBC: 4.14 10*6/uL — AB (ref 4.20–5.80)
RDW: 11.8 % (ref 11.0–15.0)
Total Lymphocyte: 45.4 %
WBC mixed population: 277 cells/uL (ref 200–950)
WBC: 4.7 10*3/uL (ref 3.8–10.8)

## 2017-06-06 LAB — T-HELPER CELL (CD4) - (RCID CLINIC ONLY)
CD4 % Helper T Cell: 49 % (ref 33–55)
CD4 T Cell Abs: 1130 /uL (ref 400–2700)

## 2017-06-06 LAB — RPR: RPR Ser Ql: NONREACTIVE

## 2017-06-07 LAB — HIV-1 RNA QUANT-NO REFLEX-BLD
HIV 1 RNA Quant: <20 copies/mL
HIV-1 RNA QUANT, LOG: NOT DETECTED {Log_copies}/mL

## 2017-06-20 ENCOUNTER — Inpatient Hospital Stay: Payer: Self-pay | Admitting: Internal Medicine

## 2017-06-20 ENCOUNTER — Encounter: Payer: Self-pay | Admitting: Internal Medicine

## 2017-06-20 ENCOUNTER — Ambulatory Visit (INDEPENDENT_AMBULATORY_CARE_PROVIDER_SITE_OTHER): Payer: Self-pay | Admitting: Internal Medicine

## 2017-06-20 VITALS — HR 66 | Temp 98.2°F | Ht 73.0 in | Wt 221.0 lb

## 2017-06-20 DIAGNOSIS — N183 Chronic kidney disease, stage 3 unspecified: Secondary | ICD-10-CM

## 2017-06-20 DIAGNOSIS — F5102 Adjustment insomnia: Secondary | ICD-10-CM

## 2017-06-20 DIAGNOSIS — F4323 Adjustment disorder with mixed anxiety and depressed mood: Secondary | ICD-10-CM

## 2017-06-20 DIAGNOSIS — B20 Human immunodeficiency virus [HIV] disease: Secondary | ICD-10-CM

## 2017-06-20 DIAGNOSIS — K5901 Slow transit constipation: Secondary | ICD-10-CM

## 2017-06-20 DIAGNOSIS — Z23 Encounter for immunization: Secondary | ICD-10-CM

## 2017-06-20 MED ORDER — TRAZODONE HCL 100 MG PO TABS: 100.0000 mg | ORAL_TABLET | Freq: Every day | ORAL | 3 refills | Status: DC

## 2017-06-20 NOTE — Progress Notes (Signed)
RFV: follow up for hiv disease  Patient ID: Nathan Wood, male   DOB: 29-Jul-1955, 62 y.o.   MRN: [id number]  HPI Nathan Wood is a 62yo M with hiv disease, CD 4 count of 1130/VL<20, with genvoya, hx of zoster. He was last seen in clinic in dec 2017. He has remained on his hiv medication throughout this time despite extended time period. He is now moved back into town in hopes of getting job, staying with a cousin.   He reports doing well. Not in a relationship. Still suffers from constipation  Outpatient Encounter Medications as of 06/20/2017  Medication Sig  . fish oil-omega-3 fatty acids 1000 MG capsule Take 2 g by mouth daily.  . GENVOYA 150-150-200-10 MG TABS tablet TAKE 1 TABLET BY MOUTH DAILY WITH BREAKFAST  . lactulose (CHRONULAC) 10 GM/15ML solution Take 15 mLs (10 g total) by mouth daily as needed for mild constipation (until desired movement occurs).  Marland Kitchen PARoxetine (PAXIL) 20 MG tablet Take 1 tablet (20 mg total) by mouth daily. Start with 1/2 tab daily x 8 days, then increase to full tab  . traZODone (DESYREL) 50 MG tablet TAKE 1 TABLET(50 MG) BY MOUTH AT BEDTIME AS NEEDED FOR SLEEP  . vitamin B-12 (CYANOCOBALAMIN) 100 MCG tablet Take 100 mcg by mouth daily.   Facility-Administered Encounter Medications as of 06/20/2017  Medication  . lactulose (CHRONULAC) 10 GM/15ML solution 20 g     Patient Active Problem List   Diagnosis Date Noted  . Acute bronchitis 01/08/2013  . Tooth abscess 02/07/2012  . Depression, major, single episode, moderate (HCC) 07/01/2011    Class: Acute  . Hypotension 04/21/2011  . Dizziness 04/21/2011  . HIV (human immunodeficiency virus infection) (HCC) 02/10/2011  . Acute pancreatitis 02/10/2011  . Abdominal pain, acute, epigastric 02/04/2011  . Hyperproteinemia 02/04/2011  . Proteinuria 02/04/2011  . Lymphadenopathy 02/04/2011  . Anemia 02/04/2011  . Rash and nonspecific skin eruption 02/04/2011     Health Maintenance Due  Topic Date Due  .  TETANUS/TDAP  09/14/1974  . COLONOSCOPY  09/13/2005    Social History   Tobacco Use  . Smoking status: Current Every Day Smoker    Packs/day: 0.50    Years: 10.00    Pack years: 5.00    Types: Cigarettes  . Smokeless tobacco: Never Used  . Tobacco comment: encouragement  Substance Use Topics  . Alcohol use: Yes    Alcohol/week: 1.0 oz    Types: 2 Standard drinks or equivalent per week    Comment: occassional  . Drug use: No   family history includes Coronary artery disease in his father; Diabetes in his mother. Review of Systems Review of Systems  Constitutional: Negative for fever, chills, diaphoresis, activity change, appetite change, fatigue and unexpected weight change.  HENT: Negative for congestion, sore throat, rhinorrhea, sneezing, trouble swallowing and sinus pressure.  Eyes: Negative for photophobia and visual disturbance.  Respiratory: Negative for cough, chest tightness, shortness of breath, wheezing and stridor.  Cardiovascular: Negative for chest pain, palpitations and leg swelling.  Gastrointestinal: Negative for nausea, vomiting, abdominal pain, diarrhea, constipation, blood in stool, abdominal distention and anal bleeding.  Genitourinary: Negative for dysuria, hematuria, flank pain and difficulty urinating.  Musculoskeletal: Negative for myalgias, back pain, joint swelling, arthralgias and gait problem.  Skin: Negative for color change, pallor, rash and wound.  Neurological: Negative for dizziness, tremors, weakness and light-headedness.  Hematological: Negative for adenopathy. Does not bruise/bleed easily.  Psychiatric/Behavioral: + sleep disturbance. Finds his mind is  racing at night. Negative for behavioral problems, confusion, dysphoric mood, decreased concentration and agitation.    Physical Exam   Ht 6\' 1"  (1.854 m)   Wt 221 lb (100.2 kg)   BMI 29.16 kg/m   Physical Exam  Constitutional: He is oriented to person, place, and time. He appears  well-developed and well-nourished. No distress.  HENT:  Mouth/Throat: Oropharynx is clear and moist. No oropharyngeal exudate.  Cardiovascular: Normal rate, regular rhythm and normal heart sounds. Exam reveals no gallop and no friction rub.  No murmur heard.  Pulmonary/Chest: Effort normal and breath sounds normal. No respiratory distress. He has no wheezes.  Abdominal: Soft. Bowel sounds are normal. He exhibits no distension. There is no tenderness.  Lymphadenopathy:  He has no cervical adenopathy.  Neurological: He is alert and oriented to person, place, and time.  Skin: Skin is warm and dry. No rash noted. No erythema.  Psychiatric: He has a normal mood and affect. His behavior is normal.    Lab Results  Component Value Date   CD4TCELL 49 06/05/2017   Lab Results  Component Value Date   CD4TABS 1,130 06/05/2017   CD4TABS 1,010 04/20/2016   CD4TABS 1,250 02/09/2016   Lab Results  Component Value Date   HIV1RNAQUANT <20 NOT DETECTED 06/05/2017   Lab Results  Component Value Date   HEPBSAB POSITIVE (A) 02/04/2011   Lab Results  Component Value Date   LABRPR NON-REACTIVE 06/05/2017    CBC Lab Results  Component Value Date   WBC 4.7 06/05/2017   RBC 4.14 (L) 06/05/2017   HGB 13.8 06/05/2017   HCT 39.6 06/05/2017   PLT 231 06/05/2017   MCV 95.7 06/05/2017   MCH 33.3 (H) 06/05/2017   MCHC 34.8 06/05/2017   RDW 11.8 06/05/2017   LYMPHSABS 2,134 06/05/2017   MONOABS 216 04/20/2016   EOSABS 188 06/05/2017    BMET Lab Results  Component Value Date   NA 139 06/05/2017   K 3.9 06/05/2017   CL 102 06/05/2017   CO2 28 06/05/2017   GLUCOSE 89 06/05/2017   BUN 24 06/05/2017   CREATININE 1.30 (H) 06/05/2017   CALCIUM 9.8 06/05/2017   GFRNONAA 59 (L) 06/05/2017   GFRAA 68 06/05/2017      Assessment and Plan  hiv disease = will continue on him on genvoya. Reapply for adap. Since he is running out of medication, will transition to tivicay/descovy until he gets  adap approval  Insomnia vs. anxiety = will try trazodone 100mg  qhs to see if it helps his symptoms  Health maintenance =will give prevnar 13  ckd 3 = stable cr function. No need to change regimen  Constipation = continue with miralax, can also use smooth move tea

## 2017-06-22 ENCOUNTER — Telehealth: Payer: Self-pay | Admitting: Behavioral Health

## 2017-06-22 NOTE — Telephone Encounter (Signed)
Patient called requesting forms that needed to be signed by doctor Drue SecondSnider.  Forms were placed up front for patient to pick up Monday 06/26/2017.  Patient verbalized understanding. Angeline SlimAshley Deniel Mcquiston RN

## 2017-07-20 ENCOUNTER — Encounter: Payer: Self-pay | Admitting: Internal Medicine

## 2017-07-20 ENCOUNTER — Other Ambulatory Visit: Payer: Self-pay | Admitting: *Deleted

## 2017-07-20 DIAGNOSIS — B20 Human immunodeficiency virus [HIV] disease: Secondary | ICD-10-CM

## 2017-07-20 MED ORDER — ELVITEG-COBIC-EMTRICIT-TENOFAF 150-150-200-10 MG PO TABS: 1.0000 | ORAL_TABLET | Freq: Every day | ORAL | 5 refills | Status: DC

## 2017-08-15 ENCOUNTER — Ambulatory Visit: Payer: Self-pay | Admitting: Internal Medicine

## 2017-09-04 ENCOUNTER — Encounter: Payer: Self-pay | Admitting: Internal Medicine

## 2017-09-04 ENCOUNTER — Ambulatory Visit (INDEPENDENT_AMBULATORY_CARE_PROVIDER_SITE_OTHER): Payer: Self-pay | Admitting: Internal Medicine

## 2017-09-04 VITALS — BP 111/72 | HR 56 | Temp 97.8°F | Wt 230.0 lb

## 2017-09-04 DIAGNOSIS — N183 Chronic kidney disease, stage 3 unspecified: Secondary | ICD-10-CM

## 2017-09-04 DIAGNOSIS — B20 Human immunodeficiency virus [HIV] disease: Secondary | ICD-10-CM

## 2017-09-04 MED ORDER — BICTEGRAVIR-EMTRICITAB-TENOFOV 50-200-25 MG PO TABS: 1.0000 | ORAL_TABLET | Freq: Every day | ORAL | 11 refills | Status: DC

## 2017-09-04 NOTE — Progress Notes (Signed)
RFV: hiv disease   Patient ID: Nathan Wood, male   DOB: 08-12-1955, 62 y.o.   MRN: [id number]  HPI 62 yo M with hiv disease, CD 4 count 1130/VL<20, on genvoya.  Doing well with adherence. his Disability denied.  He is trying to work in [employer] in the meantime, currently only part time. Staying with friends. No recent illnesses Outpatient Encounter Medications as of 09/04/2017  Medication Sig  . elvitegravir-cobicistat-emtricitabine-tenofovir (GENVOYA) 150-150-200-10 MG TABS tablet Take 1 tablet by mouth daily with breakfast.  . fish oil-omega-3 fatty acids 1000 MG capsule Take 2 g by mouth daily.  . traZODone (DESYREL) 100 MG tablet Take 1 tablet (100 mg total) by mouth at bedtime.  . vitamin B-12 (CYANOCOBALAMIN) 100 MCG tablet Take 100 mcg by mouth daily.  Marland Kitchen lactulose (CHRONULAC) 10 GM/15ML solution Take 15 mLs (10 g total) by mouth daily as needed for mild constipation (until desired movement occurs). (Patient not taking: Reported on 06/20/2017)  . PARoxetine (PAXIL) 20 MG tablet Take 1 tablet (20 mg total) by mouth daily. Start with 1/2 tab daily x 8 days, then increase to full tab (Patient not taking: Reported on 09/04/2017)   Facility-Administered Encounter Medications as of 09/04/2017  Medication  . lactulose (CHRONULAC) 10 GM/15ML solution 20 g     Patient Active Problem List   Diagnosis Date Noted  . Acute bronchitis 01/08/2013  . Tooth abscess 02/07/2012  . Depression, major, single episode, moderate (HCC) 07/01/2011    Class: Acute  . Hypotension 04/21/2011  . Dizziness 04/21/2011  . HIV (human immunodeficiency virus infection) (HCC) 02/10/2011  . Acute pancreatitis 02/10/2011  . Abdominal pain, acute, epigastric 02/04/2011  . Hyperproteinemia 02/04/2011  . Proteinuria 02/04/2011  . Lymphadenopathy 02/04/2011  . Anemia 02/04/2011  . Rash and nonspecific skin eruption 02/04/2011     Health Maintenance Due  Topic Date Due  . TETANUS/TDAP  09/14/1974  . COLONOSCOPY   09/13/2005    Social History   Tobacco Use  . Smoking status: Current Every Day Smoker    Packs/day: 0.50    Years: 10.00    Pack years: 5.00    Types: Cigarettes  . Smokeless tobacco: Never Used  . Tobacco comment: encouragement  Substance Use Topics  . Alcohol use: Yes    Alcohol/week: 1.2 oz    Types: 2 Standard drinks or equivalent per week    Comment: occassional  . Drug use: No   Review of Systems   Constitutional: Negative for fever, chills, diaphoresis, activity change, appetite change, fatigue and unexpected weight change.  HENT: Negative for congestion, sore throat, rhinorrhea, sneezing, trouble swallowing and sinus pressure.  Eyes: Negative for photophobia and visual disturbance.  Respiratory: Negative for cough, chest tightness, shortness of breath, wheezing and stridor.  Cardiovascular: Negative for chest pain, palpitations and leg swelling.  Gastrointestinal: Negative for nausea, vomiting, abdominal pain, diarrhea, constipation, blood in stool, abdominal distention and anal bleeding.  Genitourinary: Negative for dysuria, hematuria, flank pain and difficulty urinating.  Musculoskeletal: Negative for myalgias, back pain, joint swelling, arthralgias and gait problem.  Skin: Negative for color change, pallor, rash and wound.  Neurological: Negative for dizziness, tremors, weakness and light-headedness.  Hematological: Negative for adenopathy. Does not bruise/bleed easily.  Psychiatric/Behavioral: Negative for behavioral problems, confusion, sleep disturbance, dysphoric mood, decreased concentration and agitation.    Physical Exam   BP 111/72   Pulse (!) 56   Temp 97.8 F (36.6 C) (Oral)   Wt 230 lb (104.3 kg)   BMI  30.34 kg/m   Physical Exam  Constitutional: He is oriented to person, place, and time. He appears well-developed and well-nourished. No distress.  HENT:  Mouth/Throat: Oropharynx is clear and moist. No oropharyngeal exudate.  Cardiovascular: Normal  rate, regular rhythm and normal heart sounds. Exam reveals no gallop and no friction rub.  No murmur heard.  Pulmonary/Chest: Effort normal and breath sounds normal. No respiratory distress. He has no wheezes.  Lymphadenopathy:  He has no cervical adenopathy.  Neurological: He is alert and oriented to person, place, and time.  Skin: Skin is warm and dry. No rash noted. No erythema.  Psychiatric: He has a normal mood and affect. His behavior is normal.    Lab Results  Component Value Date   CD4TCELL 49 06/05/2017   Lab Results  Component Value Date   CD4TABS 1,130 06/05/2017   CD4TABS 1,010 04/20/2016   CD4TABS 1,250 02/09/2016   Lab Results  Component Value Date   HIV1RNAQUANT <20 NOT DETECTED 06/05/2017   Lab Results  Component Value Date   HEPBSAB POSITIVE (A) 02/04/2011   Lab Results  Component Value Date   LABRPR NON-REACTIVE 06/05/2017    CBC Lab Results  Component Value Date   WBC 4.7 06/05/2017   RBC 4.14 (L) 06/05/2017   HGB 13.8 06/05/2017   HCT 39.6 06/05/2017   PLT 231 06/05/2017   MCV 95.7 06/05/2017   MCH 33.3 (H) 06/05/2017   MCHC 34.8 06/05/2017   RDW 11.8 06/05/2017   LYMPHSABS 2,134 06/05/2017   MONOABS 216 04/20/2016   EOSABS 188 06/05/2017    BMET Lab Results  Component Value Date   NA 139 06/05/2017   K 3.9 06/05/2017   CL 102 06/05/2017   CO2 28 06/05/2017   GLUCOSE 89 06/05/2017   BUN 24 06/05/2017   CREATININE 1.30 (H) 06/05/2017   CALCIUM 9.8 06/05/2017   GFRNONAA 59 (L) 06/05/2017   GFRAA 68 06/05/2017      Assessment and Plan HIV disease= doing well. Continue with current regimen. Will check labs in Oct  ckd 3 = stable from last blood draw. Continue to monitor with next blood draw.  Health maintenance = will need tdap at next visit. See back in 3 months

## 2017-09-04 NOTE — Patient Instructions (Signed)
Come 2 weeks before next visit

## 2017-09-26 ENCOUNTER — Encounter: Payer: Self-pay | Admitting: Internal Medicine

## 2017-12-13 ENCOUNTER — Ambulatory Visit (INDEPENDENT_AMBULATORY_CARE_PROVIDER_SITE_OTHER): Payer: Self-pay | Admitting: Internal Medicine

## 2017-12-13 ENCOUNTER — Encounter: Payer: Self-pay | Admitting: Internal Medicine

## 2017-12-13 ENCOUNTER — Ambulatory Visit: Payer: Self-pay | Admitting: Internal Medicine

## 2017-12-13 VITALS — BP 129/95 | Temp 98.1°F | Wt 222.8 lb

## 2017-12-13 DIAGNOSIS — N183 Chronic kidney disease, stage 3 unspecified: Secondary | ICD-10-CM

## 2017-12-13 DIAGNOSIS — F320 Major depressive disorder, single episode, mild: Secondary | ICD-10-CM

## 2017-12-13 DIAGNOSIS — J302 Other seasonal allergic rhinitis: Secondary | ICD-10-CM

## 2017-12-13 DIAGNOSIS — B20 Human immunodeficiency virus [HIV] disease: Secondary | ICD-10-CM

## 2017-12-13 DIAGNOSIS — Z23 Encounter for immunization: Secondary | ICD-10-CM

## 2017-12-13 MED ORDER — PAROXETINE HCL 20 MG PO TABS: 20.0000 mg | ORAL_TABLET | Freq: Every day | ORAL | 11 refills | Status: DC

## 2017-12-13 NOTE — Progress Notes (Signed)
RFV: follow up for hiv disease  Patient ID: Nathan Wood, male   DOB: 07-Dec-1955, 62 y.o.   MRN: [id number]  HPI 62yo M with hiv disease, CD 4 count of 1130/VL<20 In April 2019, currently on biktarvy. Doing well except for seasonal allergies. Has intermittent congestion. No fevers or cough. He states that he did not ever receive paxil and still interested in trying  Not in any new relationships.  Outpatient Encounter Medications as of 12/13/2017  Medication Sig  . bictegravir-emtricitabine-tenofovir AF (BIKTARVY) 50-200-25 MG TABS tablet Take 1 tablet by mouth daily.  . traZODone (DESYREL) 100 MG tablet Take 1 tablet (100 mg total) by mouth at bedtime.  . fish oil-omega-3 fatty acids 1000 MG capsule Take 2 g by mouth daily.  Marland Kitchen lactulose (CHRONULAC) 10 GM/15ML solution Take 15 mLs (10 g total) by mouth daily as needed for mild constipation (until desired movement occurs). (Patient not taking: Reported on 06/20/2017)  . PARoxetine (PAXIL) 20 MG tablet Take 1 tablet (20 mg total) by mouth daily. Start with 1/2 tab daily x 8 days, then increase to full tab (Patient not taking: Reported on 09/04/2017)  . vitamin B-12 (CYANOCOBALAMIN) 100 MCG tablet Take 100 mcg by mouth daily.   Facility-Administered Encounter Medications as of 12/13/2017  Medication  . lactulose (CHRONULAC) 10 GM/15ML solution 20 g     Patient Active Problem List   Diagnosis Date Noted  . Acute bronchitis 01/08/2013  . Tooth abscess 02/07/2012  . Depression, major, single episode, moderate (HCC) 07/01/2011    Class: Acute  . Hypotension 04/21/2011  . Dizziness 04/21/2011  . HIV (human immunodeficiency virus infection) (HCC) 02/10/2011  . Acute pancreatitis 02/10/2011  . Abdominal pain, acute, epigastric 02/04/2011  . Hyperproteinemia 02/04/2011  . Proteinuria 02/04/2011  . Lymphadenopathy 02/04/2011  . Anemia 02/04/2011  . Rash and nonspecific skin eruption 02/04/2011     Health Maintenance Due  Topic Date Due    . TETANUS/TDAP  09/14/1974  . COLONOSCOPY  09/13/2005  . INFLUENZA VACCINE  10/05/2017     Review of Systems 12 point ros is negative, except what is mentioned in hpi Physical Exam   BP (!) 129/95   Temp 98.1 F (36.7 C)   Wt 222 lb 12.8 oz (101.1 kg)   BMI 29.39 kg/m   Physical Exam  Constitutional: He is oriented to person, place, and time. He appears well-developed and well-nourished. No distress.  HENT:  Mouth/Throat: Oropharynx is clear and moist. No oropharyngeal exudate.  Cardiovascular: Normal rate, regular rhythm and normal heart sounds. Exam reveals no gallop and no friction rub.  No murmur heard.  Pulmonary/Chest: Effort normal and breath sounds normal. No respiratory distress. He has no wheezes.  Abdominal: Soft. Bowel sounds are normal. He exhibits no distension. There is no tenderness.  Lymphadenopathy:  He has no cervical adenopathy.  Neurological: He is alert and oriented to person, place, and time.  Skin: Skin is warm and dry. No rash noted. No erythema.  Psychiatric: He has a normal mood and affect. His behavior is normal.    Lab Results  Component Value Date   CD4TCELL 49 06/05/2017   Lab Results  Component Value Date   CD4TABS 1,130 06/05/2017   CD4TABS 1,010 04/20/2016   CD4TABS 1,250 02/09/2016   Lab Results  Component Value Date   HIV1RNAQUANT <20 NOT DETECTED 06/05/2017   Lab Results  Component Value Date   HEPBSAB POSITIVE (A) 02/04/2011   Lab Results  Component Value Date  LABRPR NON-REACTIVE 06/05/2017    CBC Lab Results  Component Value Date   WBC 4.7 06/05/2017   RBC 4.14 (L) 06/05/2017   HGB 13.8 06/05/2017   HCT 39.6 06/05/2017   PLT 231 06/05/2017   MCV 95.7 06/05/2017   MCH 33.3 (H) 06/05/2017   MCHC 34.8 06/05/2017   RDW 11.8 06/05/2017   LYMPHSABS 2,134 06/05/2017   MONOABS 216 04/20/2016   EOSABS 188 06/05/2017    BMET Lab Results  Component Value Date   NA 139 06/05/2017   K 3.9 06/05/2017   CL 102  06/05/2017   CO2 28 06/05/2017   GLUCOSE 89 06/05/2017   BUN 24 06/05/2017   CREATININE 1.30 (H) 06/05/2017   CALCIUM 9.8 06/05/2017   GFRNONAA 59 (L) 06/05/2017   GFRAA 68 06/05/2017      Assessment and Plan  hiv disease= will check labs. Anticipate to still be well controlled and continue on biktarvy  Health maintenance =will give tdap today, but wants to defer flu for another few weeks  ckd 3 = will check cr to see that it is stable  Seasonal allergies = will give rx for claritin  Depression = will start paxil

## 2017-12-14 LAB — URINALYSIS
BILIRUBIN URINE: NEGATIVE
GLUCOSE, UA: NEGATIVE
HGB URINE DIPSTICK: NEGATIVE
Ketones, ur: NEGATIVE
LEUKOCYTES UA: NEGATIVE
Nitrite: NEGATIVE
PROTEIN: NEGATIVE
Specific Gravity, Urine: 1.016 (ref 1.001–1.03)
pH: 6.5 (ref 5.0–8.0)

## 2017-12-14 LAB — T-HELPER CELL (CD4) - (RCID CLINIC ONLY)
CD4 T CELL ABS: 880 /uL (ref 400–2700)
CD4 T CELL HELPER: 47 % (ref 33–55)

## 2017-12-14 LAB — URINE CYTOLOGY ANCILLARY ONLY
Chlamydia: NEGATIVE
Neisseria Gonorrhea: NEGATIVE

## 2017-12-15 LAB — CBC WITH DIFFERENTIAL/PLATELET
Basophils Absolute: 62 cells/uL (ref 0–200)
Basophils Relative: 1.5 %
Eosinophils Absolute: 221 cells/uL (ref 15–500)
Eosinophils Relative: 5.4 %
HCT: 38.7 % (ref 38.5–50.0)
Hemoglobin: 13.2 g/dL (ref 13.2–17.1)
Lymphs Abs: 1743 cells/uL (ref 850–3900)
MCH: 32.7 pg (ref 27.0–33.0)
MCHC: 34.1 g/dL (ref 32.0–36.0)
MCV: 95.8 fL (ref 80.0–100.0)
MONOS PCT: 6.4 %
MPV: 10.9 fL (ref 7.5–12.5)
NEUTROS PCT: 44.2 %
Neutro Abs: 1812 cells/uL (ref 1500–7800)
PLATELETS: 267 10*3/uL (ref 140–400)
RBC: 4.04 10*6/uL — AB (ref 4.20–5.80)
RDW: 11.6 % (ref 11.0–15.0)
TOTAL LYMPHOCYTE: 42.5 %
WBC mixed population: 262 cells/uL (ref 200–950)
WBC: 4.1 10*3/uL (ref 3.8–10.8)

## 2017-12-15 LAB — COMPLETE METABOLIC PANEL WITH GFR
AG RATIO: 1.3 (calc) (ref 1.0–2.5)
ALBUMIN MSPROF: 4.2 g/dL (ref 3.6–5.1)
ALKALINE PHOSPHATASE (APISO): 60 U/L (ref 40–115)
ALT: 14 U/L (ref 9–46)
AST: 18 U/L (ref 10–35)
BUN: 14 mg/dL (ref 7–25)
CO2: 26 mmol/L (ref 20–32)
Calcium: 9.3 mg/dL (ref 8.6–10.3)
Chloride: 103 mmol/L (ref 98–110)
Creat: 1.16 mg/dL (ref 0.70–1.25)
GFR, EST AFRICAN AMERICAN: 78 mL/min/{1.73_m2} (ref >=60)
GFR, Est Non African American: 67 mL/min/{1.73_m2} (ref >=60)
GLUCOSE: 98 mg/dL (ref 65–99)
Globulin: 3.2 g/dL (calc) (ref 1.9–3.7)
POTASSIUM: 4.3 mmol/L (ref 3.5–5.3)
Sodium: 136 mmol/L (ref 135–146)
TOTAL PROTEIN: 7.4 g/dL (ref 6.1–8.1)
Total Bilirubin: 0.5 mg/dL (ref 0.2–1.2)

## 2017-12-15 LAB — HIV-1 RNA QUANT-NO REFLEX-BLD
HIV 1 RNA QUANT: 30 {copies}/mL — AB
HIV-1 RNA Quant, Log: 1.48 Log copies/mL — ABNORMAL HIGH

## 2017-12-15 LAB — RPR: RPR Ser Ql: NONREACTIVE

## 2017-12-19 ENCOUNTER — Ambulatory Visit (INDEPENDENT_AMBULATORY_CARE_PROVIDER_SITE_OTHER): Payer: Self-pay

## 2017-12-19 DIAGNOSIS — Z23 Encounter for immunization: Secondary | ICD-10-CM

## 2017-12-20 ENCOUNTER — Other Ambulatory Visit: Payer: Self-pay | Admitting: Internal Medicine

## 2017-12-20 DIAGNOSIS — B20 Human immunodeficiency virus [HIV] disease: Secondary | ICD-10-CM

## 2018-03-12 ENCOUNTER — Ambulatory Visit: Payer: Self-pay

## 2018-03-19 ENCOUNTER — Ambulatory Visit: Payer: Self-pay | Admitting: Internal Medicine

## 2018-04-10 ENCOUNTER — Encounter: Payer: Self-pay | Admitting: Internal Medicine

## 2018-04-16 ENCOUNTER — Encounter: Payer: Self-pay | Admitting: Internal Medicine

## 2018-04-16 ENCOUNTER — Ambulatory Visit (INDEPENDENT_AMBULATORY_CARE_PROVIDER_SITE_OTHER): Payer: Self-pay | Admitting: Internal Medicine

## 2018-04-16 VITALS — BP 127/74 | HR 58 | Temp 98.6°F | Wt 223.0 lb

## 2018-04-16 DIAGNOSIS — N183 Chronic kidney disease, stage 3 unspecified: Secondary | ICD-10-CM

## 2018-04-16 DIAGNOSIS — B20 Human immunodeficiency virus [HIV] disease: Secondary | ICD-10-CM

## 2018-04-16 DIAGNOSIS — G629 Polyneuropathy, unspecified: Secondary | ICD-10-CM

## 2018-04-16 MED ORDER — GABAPENTIN 300 MG PO CAPS: 300.0000 mg | ORAL_CAPSULE | Freq: Every day | ORAL | 3 refills | Status: DC

## 2018-04-16 NOTE — Progress Notes (Signed)
RFV: follow up for hiv disease  Patient ID: Nathan Wood, male   DOB: 07/05/1955, 63 y.o.   MRN: [id number]  HPI Nathan Wood is a 63yo M with hiv disease, CD 4 count of 880/VL<20 ( oct 2019) on biktarvy. Has had good holidays. Good adherence.  - not in any new relationships  Smoking - 1/2ppd,  1-2 glasses/night  Outpatient Encounter Medications as of 04/16/2018  Medication Sig  . bictegravir-emtricitabine-tenofovir AF (BIKTARVY) 50-200-25 MG TABS tablet Take 1 tablet by mouth daily.  . fish oil-omega-3 fatty acids 1000 MG capsule Take 2 g by mouth daily.  . GENVOYA 150-150-200-10 MG TABS tablet TAKE 1 TABLET BY MOUTH DAILY WITH BREAKFAST  . lactulose (CHRONULAC) 10 GM/15ML solution Take 15 mLs (10 g total) by mouth daily as needed for mild constipation (until desired movement occurs). (Patient not taking: Reported on 06/20/2017)  . PARoxetine (PAXIL) 20 MG tablet Take 1 tablet (20 mg total) by mouth daily. Start with 1/2 tab daily x 8 days, then increase to full tab  . traZODone (DESYREL) 100 MG tablet TAKE 1 TABLET(100 MG) BY MOUTH AT BEDTIME  . vitamin B-12 (CYANOCOBALAMIN) 100 MCG tablet Take 100 mcg by mouth daily.   Facility-Administered Encounter Medications as of 04/16/2018  Medication  . lactulose (CHRONULAC) 10 GM/15ML solution 20 g     Patient Active Problem List   Diagnosis Date Noted  . Acute bronchitis 01/08/2013  . Tooth abscess 02/07/2012  . Depression, major, single episode, moderate (HCC) 07/01/2011    Class: Acute  . Hypotension 04/21/2011  . Dizziness 04/21/2011  . HIV (human immunodeficiency virus infection) (HCC) 02/10/2011  . Acute pancreatitis 02/10/2011  . Abdominal pain, acute, epigastric 02/04/2011  . Hyperproteinemia 02/04/2011  . Proteinuria 02/04/2011  . Lymphadenopathy 02/04/2011  . Anemia 02/04/2011  . Rash and nonspecific skin eruption 02/04/2011     Health Maintenance Due  Topic Date Due  . COLONOSCOPY  09/13/2005    Social History    Tobacco Use  . Smoking status: Current Every Day Smoker    Packs/day: 0.50    Years: 10.00    Pack years: 5.00    Types: Cigarettes  . Smokeless tobacco: Never Used  . Tobacco comment: encouragement  Substance Use Topics  . Alcohol use: Yes    Alcohol/week: 2.0 standard drinks    Types: 2 Standard drinks or equivalent per week    Comment: occassional  . Drug use: No   Review of Systems Review of Systems  Constitutional: Negative for fever, chills, diaphoresis, activity change, appetite change, fatigue and unexpected weight change.  HENT: Negative for congestion, sore throat, rhinorrhea, sneezing, trouble swallowing and sinus pressure.  Eyes: Negative for photophobia and visual disturbance.  Respiratory: Negative for cough, chest tightness, shortness of breath, wheezing and stridor.  Cardiovascular: Negative for chest pain, palpitations and leg swelling.  Gastrointestinal: Negative for nausea, vomiting, abdominal pain, diarrhea, constipation, blood in stool, abdominal distention and anal bleeding.  Genitourinary: Negative for dysuria, hematuria, flank pain and difficulty urinating.  Musculoskeletal: Negative for myalgias, back pain, joint swelling, arthralgias and gait problem.  Skin: Negative for color change, pallor, rash and wound.  Neurological: Negative for dizziness, tremors, weakness and light-headedness.  Hematological: Negative for adenopathy. Does not bruise/bleed easily.  Psychiatric/Behavioral: Negative for behavioral problems, confusion, sleep disturbance, dysphoric mood, decreased concentration and agitation.    Physical Exam  Physical Exam  Constitutional: He is oriented to person, place, and time. He appears well-developed and well-nourished. No distress.  HENT:  Mouth/Throat: Oropharynx is clear and moist. No oropharyngeal exudate.  Cardiovascular: Normal rate, regular rhythm and normal heart sounds. Exam reveals no gallop and no friction rub.  No murmur  heard.  Pulmonary/Chest: Effort normal and breath sounds normal. No respiratory distress. He has no wheezes.  Abdominal: Soft. Bowel sounds are normal. He exhibits no distension. There is no tenderness.  Lymphadenopathy:  He has no cervical adenopathy.  Neurological: He is alert and oriented to person, place, and time.  Skin: Skin is warm and dry. No rash noted. No erythema.  Psychiatric: He has a normal mood and affect. His behavior is normal.    Lab Results  Component Value Date   CD4TCELL 47 12/13/2017   Lab Results  Component Value Date   CD4TABS 880 12/13/2017   CD4TABS 1,130 06/05/2017   CD4TABS 1,010 04/20/2016   Lab Results  Component Value Date   HIV1RNAQUANT 30 (H) 12/13/2017   Lab Results  Component Value Date   HEPBSAB POSITIVE (A) 02/04/2011   Lab Results  Component Value Date   LABRPR NON-REACTIVE 12/13/2017    CBC Lab Results  Component Value Date   WBC 4.1 12/13/2017   RBC 4.04 (L) 12/13/2017   HGB 13.2 12/13/2017   HCT 38.7 12/13/2017   PLT 267 12/13/2017   MCV 95.8 12/13/2017   MCH 32.7 12/13/2017   MCHC 34.1 12/13/2017   RDW 11.6 12/13/2017   LYMPHSABS 1,743 12/13/2017   MONOABS 216 04/20/2016   EOSABS 221 12/13/2017    BMET Lab Results  Component Value Date   NA 136 12/13/2017   K 4.3 12/13/2017   CL 103 12/13/2017   CO2 26 12/13/2017   GLUCOSE 98 12/13/2017   BUN 14 12/13/2017   CREATININE 1.16 12/13/2017   CALCIUM 9.3 12/13/2017   GFRNONAA 67 12/13/2017   GFRAA 78 12/13/2017      Assessment and Plan  hiv disease- will check labs Health maintenance = up todate on labs ckd 3 = will check ua for proteins  [facility] = they sent up for colosocopy.   Left bicep = nerve "pin sticking" -description of pain. gapapentin to try to see improve.

## 2018-04-18 LAB — COMPLETE METABOLIC PANEL WITH GFR
AG RATIO: 1.3 (calc) (ref 1.0–2.5)
ALBUMIN MSPROF: 4.5 g/dL (ref 3.6–5.1)
ALKALINE PHOSPHATASE (APISO): 72 U/L (ref 35–144)
ALT: 21 U/L (ref 9–46)
AST: 21 U/L (ref 10–35)
BILIRUBIN TOTAL: 0.4 mg/dL (ref 0.2–1.2)
BUN: 17 mg/dL (ref 7–25)
CHLORIDE: 104 mmol/L (ref 98–110)
CO2: 28 mmol/L (ref 20–32)
Calcium: 10 mg/dL (ref 8.6–10.3)
Creat: 1.13 mg/dL (ref 0.70–1.25)
GFR, EST AFRICAN AMERICAN: 80 mL/min/{1.73_m2} (ref >=60)
GFR, Est Non African American: 69 mL/min/{1.73_m2} (ref >=60)
Globulin: 3.5 g/dL (calc) (ref 1.9–3.7)
Glucose, Bld: 88 mg/dL (ref 65–99)
POTASSIUM: 4.7 mmol/L (ref 3.5–5.3)
Sodium: 140 mmol/L (ref 135–146)
TOTAL PROTEIN: 8 g/dL (ref 6.1–8.1)

## 2018-04-18 LAB — CBC WITH DIFFERENTIAL/PLATELET
ABSOLUTE MONOCYTES: 258 {cells}/uL (ref 200–950)
BASOS ABS: 60 {cells}/uL (ref 0–200)
BASOS PCT: 1.3 %
EOS ABS: 221 {cells}/uL (ref 15–500)
Eosinophils Relative: 4.8 %
HCT: 39.9 % (ref 38.5–50.0)
HEMOGLOBIN: 13.8 g/dL (ref 13.2–17.1)
Lymphs Abs: 2001 cells/uL (ref 850–3900)
MCH: 33.4 pg — AB (ref 27.0–33.0)
MCHC: 34.6 g/dL (ref 32.0–36.0)
MCV: 96.6 fL (ref 80.0–100.0)
MPV: 11 fL (ref 7.5–12.5)
Monocytes Relative: 5.6 %
NEUTROS ABS: 2061 {cells}/uL (ref 1500–7800)
Neutrophils Relative %: 44.8 %
Platelets: 264 10*3/uL (ref 140–400)
RBC: 4.13 10*6/uL — ABNORMAL LOW (ref 4.20–5.80)
RDW: 11.4 % (ref 11.0–15.0)
Total Lymphocyte: 43.5 %
WBC: 4.6 10*3/uL (ref 3.8–10.8)

## 2018-04-18 LAB — URINALYSIS
Bilirubin Urine: NEGATIVE
GLUCOSE, UA: NEGATIVE
HGB URINE DIPSTICK: NEGATIVE
KETONES UR: NEGATIVE
Leukocytes,Ua: NEGATIVE
Nitrite: NEGATIVE
Protein, ur: NEGATIVE
Specific Gravity, Urine: 1.021 (ref 1.001–1.03)

## 2018-04-18 LAB — T-HELPER CELL (CD4) - (RCID CLINIC ONLY)
CD4 T CELL HELPER: 53 % (ref 33–55)
CD4 T Cell Abs: 1090 /uL (ref 400–2700)

## 2018-04-18 LAB — HIV-1 RNA QUANT-NO REFLEX-BLD
HIV 1 RNA Quant: 62 copies/mL — ABNORMAL HIGH
HIV-1 RNA Quant, Log: 1.79 Log copies/mL — ABNORMAL HIGH

## 2018-04-18 LAB — RPR: RPR: NONREACTIVE

## 2018-05-16 ENCOUNTER — Ambulatory Visit: Payer: Self-pay | Admitting: Family Medicine

## 2018-05-18 ENCOUNTER — Ambulatory Visit: Payer: Self-pay | Admitting: Pulmonary Disease

## 2018-07-09 ENCOUNTER — Ambulatory Visit: Payer: Self-pay | Admitting: Family Medicine

## 2018-07-16 ENCOUNTER — Other Ambulatory Visit: Payer: Self-pay

## 2018-07-16 ENCOUNTER — Encounter: Payer: Self-pay | Admitting: Internal Medicine

## 2018-07-16 ENCOUNTER — Ambulatory Visit (INDEPENDENT_AMBULATORY_CARE_PROVIDER_SITE_OTHER): Payer: Self-pay | Admitting: Internal Medicine

## 2018-07-16 VITALS — BP 115/73 | HR 70 | Temp 98.0°F | Wt 228.0 lb

## 2018-07-16 DIAGNOSIS — B20 Human immunodeficiency virus [HIV] disease: Secondary | ICD-10-CM

## 2018-07-16 DIAGNOSIS — Z79899 Other long term (current) drug therapy: Secondary | ICD-10-CM

## 2018-07-16 DIAGNOSIS — M79605 Pain in left leg: Secondary | ICD-10-CM

## 2018-07-16 DIAGNOSIS — Z716 Tobacco abuse counseling: Secondary | ICD-10-CM

## 2018-07-16 DIAGNOSIS — M79604 Pain in right leg: Secondary | ICD-10-CM

## 2018-07-16 NOTE — Progress Notes (Signed)
RFV: follow up for hiv disease  Patient ID: Nathan Wood, male   DOB: 1955/12/07, 63 y.o.   MRN: [id number]  HPI Nathan Wood is a 63yo M with well controlled hiv disease, with CD 4 count of 1000, with small viral blip of 62 copies, take biktarvy. Does not report missing a dose. Of late, he has noticed ongoing left lateral quadricep pain radiates to inner thigh at times. Has had some weight gain in the past few months. His leg pain appears to be improved when ambulating or standing. Mostly occurs sitting/laying down.  Outpatient Encounter Medications as of 07/16/2018  Medication Sig  . bictegravir-emtricitabine-tenofovir AF (BIKTARVY) 50-200-25 MG TABS tablet Take 1 tablet by mouth daily.  . fish oil-omega-3 fatty acids 1000 MG capsule Take 2 g by mouth daily.  Marland Kitchen. gabapentin (NEURONTIN) 300 MG capsule Take 1 capsule (300 mg total) by mouth at bedtime.  . GENVOYA 150-150-200-10 MG TABS tablet TAKE 1 TABLET BY MOUTH DAILY WITH BREAKFAST  . lactulose (CHRONULAC) 10 GM/15ML solution Take 15 mLs (10 g total) by mouth daily as needed for mild constipation (until desired movement occurs).  Marland Kitchen. PARoxetine (PAXIL) 20 MG tablet Take 1 tablet (20 mg total) by mouth daily. Start with 1/2 tab daily x 8 days, then increase to full tab  . traZODone (DESYREL) 100 MG tablet TAKE 1 TABLET(100 MG) BY MOUTH AT BEDTIME  . vitamin B-12 (CYANOCOBALAMIN) 100 MCG tablet Take 100 mcg by mouth daily.   Facility-Administered Encounter Medications as of 07/16/2018  Medication  . lactulose (CHRONULAC) 10 GM/15ML solution 20 g     Patient Active Problem List   Diagnosis Date Noted  . Acute bronchitis 01/08/2013  . Tooth abscess 02/07/2012  . Depression, major, single episode, moderate (HCC) 07/01/2011    Class: Acute  . Hypotension 04/21/2011  . Dizziness 04/21/2011  . HIV (human immunodeficiency virus infection) (HCC) 02/10/2011  . Acute pancreatitis 02/10/2011  . Abdominal pain, acute, epigastric 02/04/2011  .  Hyperproteinemia 02/04/2011  . Proteinuria 02/04/2011  . Lymphadenopathy 02/04/2011  . Anemia 02/04/2011  . Rash and nonspecific skin eruption 02/04/2011   Social History   Tobacco Use  . Smoking status: Current Every Day Smoker    Packs/day: 0.50    Years: 10.00    Pack years: 5.00    Types: Cigarettes  . Smokeless tobacco: Never Used  . Tobacco comment: encouragement  Substance Use Topics  . Alcohol use: Yes    Alcohol/week: 2.0 standard drinks    Types: 2 Standard drinks or equivalent per week    Comment: occassional  . Drug use: No    Health Maintenance Due  Topic Date Due  . COLONOSCOPY  09/13/2005     Review of Systems 12 point ros except what is mentioned in hpi Physical Exam   BP 115/73   Pulse 70   Temp 98 F (36.7 C)   Wt 228 lb (103.4 kg)   BMI 30.08 kg/m   Physical Exam  Constitutional: He is oriented to person, place, and time. He appears well-developed and well-nourished. No distress.  HENT:  Mouth/Throat: Oropharynx is clear and moist. No oropharyngeal exudate.  Cardiovascular: Normal rate, regular rhythm and normal heart sounds. Exam reveals no gallop and no friction rub.  No murmur heard.  Pulmonary/Chest: Effort normal and breath sounds normal. No respiratory distress. He has no wheezes.  Abdominal: Soft. Bowel sounds are normal. He exhibits no distension. There is no tenderness.  Lymphadenopathy:  He has no cervical adenopathy.  Neurological: He is alert and oriented to person, place, and time.  Skin: Skin is warm and dry. No rash noted. No erythema.  Ext: no palpable pain with thighs, unable to illicit pain with range of motion exercises Psychiatric: He has a normal mood and affect. His behavior is normal.    Lab Results  Component Value Date   CD4TCELL 53 04/16/2018   Lab Results  Component Value Date   CD4TABS 1,090 04/16/2018   CD4TABS 880 12/13/2017   CD4TABS 1,130 06/05/2017   Lab Results  Component Value Date   HIV1RNAQUANT  62 (H) 04/16/2018   Lab Results  Component Value Date   HEPBSAB POSITIVE (A) 02/04/2011   Lab Results  Component Value Date   LABRPR NON-REACTIVE 04/16/2018    CBC Lab Results  Component Value Date   WBC 4.6 04/16/2018   RBC 4.13 (L) 04/16/2018   HGB 13.8 04/16/2018   HCT 39.9 04/16/2018   PLT 264 04/16/2018   MCV 96.6 04/16/2018   MCH 33.4 (H) 04/16/2018   MCHC 34.6 04/16/2018   RDW 11.4 04/16/2018   LYMPHSABS 2,001 04/16/2018   MONOABS 216 04/20/2016   EOSABS 221 04/16/2018    BMET Lab Results  Component Value Date   NA 140 04/16/2018   K 4.7 04/16/2018   CL 104 04/16/2018   CO2 28 04/16/2018   GLUCOSE 88 04/16/2018   BUN 17 04/16/2018   CREATININE 1.13 04/16/2018   CALCIUM 10.0 04/16/2018   GFRNONAA 69 04/16/2018   GFRAA 80 04/16/2018     Assessment and Plan  hiv disease= small viral blip that is likely not siginficant. Will recheck in next month ,but need to conitnue for him to take biktarvy daily  Thigh pain =gave stretching exercises to try possibly he is having iliotibila band syndrome. See if any improvement over the next few weeks before other referral.  Long term medication management = cr is stable.  Smoking cessation = appears pre-contemplative. Discussed how to try to decrease intake. Roughly 1 pack per 5 days

## 2018-07-17 LAB — T-HELPER CELLS (CD4) COUNT (NOT AT ARMC)
CD4 % Helper T Cell: 55 % (ref 33–65)
CD4 T Cell Abs: 940 /uL (ref 400–1790)

## 2018-07-24 LAB — CBC WITH DIFFERENTIAL/PLATELET
Absolute Monocytes: 296 cells/uL (ref 200–950)
Basophils Absolute: 51 cells/uL (ref 0–200)
Basophils Relative: 0.9 %
Eosinophils Absolute: 148 cells/uL (ref 15–500)
Eosinophils Relative: 2.6 %
HCT: 38.9 % (ref 38.5–50.0)
Hemoglobin: 13.4 g/dL (ref 13.2–17.1)
Lymphs Abs: 1870 cells/uL (ref 850–3900)
MCH: 33.5 pg — ABNORMAL HIGH (ref 27.0–33.0)
MCHC: 34.4 g/dL (ref 32.0–36.0)
MCV: 97.3 fL (ref 80.0–100.0)
MPV: 10.9 fL (ref 7.5–12.5)
Monocytes Relative: 5.2 %
Neutro Abs: 3335 cells/uL (ref 1500–7800)
Neutrophils Relative %: 58.5 %
Platelets: 287 10*3/uL (ref 140–400)
RBC: 4 10*6/uL — ABNORMAL LOW (ref 4.20–5.80)
RDW: 11.8 % (ref 11.0–15.0)
Total Lymphocyte: 32.8 %
WBC: 5.7 10*3/uL (ref 3.8–10.8)

## 2018-07-24 LAB — COMPREHENSIVE METABOLIC PANEL
AG Ratio: 1.2 (calc) (ref 1.0–2.5)
ALT: 24 U/L (ref 9–46)
AST: 25 U/L (ref 10–35)
Albumin: 4.2 g/dL (ref 3.6–5.1)
Alkaline phosphatase (APISO): 79 U/L (ref 35–144)
BUN/Creatinine Ratio: 12 (calc) (ref 6–22)
BUN: 15 mg/dL (ref 7–25)
CO2: 29 mmol/L (ref 20–32)
Calcium: 9.7 mg/dL (ref 8.6–10.3)
Chloride: 103 mmol/L (ref 98–110)
Creat: 1.26 mg/dL — ABNORMAL HIGH (ref 0.70–1.25)
Globulin: 3.5 g/dL (calc) (ref 1.9–3.7)
Glucose, Bld: 82 mg/dL (ref 65–99)
Potassium: 4.4 mmol/L (ref 3.5–5.3)
Sodium: 139 mmol/L (ref 135–146)
Total Bilirubin: 0.4 mg/dL (ref 0.2–1.2)
Total Protein: 7.7 g/dL (ref 6.1–8.1)

## 2018-07-24 LAB — LIPID PANEL
Cholesterol: 206 mg/dL — ABNORMAL HIGH (ref <200)
HDL: 39 mg/dL — ABNORMAL LOW (ref >=40)
LDL Cholesterol (Calc): 128 mg/dL (calc) — ABNORMAL HIGH
Non-HDL Cholesterol (Calc): 167 mg/dL (calc) — ABNORMAL HIGH (ref <130)
Total CHOL/HDL Ratio: 5.3 (calc) — ABNORMAL HIGH (ref <5.0)
Triglycerides: 256 mg/dL — ABNORMAL HIGH (ref <150)

## 2018-07-24 LAB — HIV-1 RNA QUANT-NO REFLEX-BLD
HIV 1 RNA Quant: <20 copies/mL
HIV-1 RNA Quant, Log: <1.3 Log copies/mL

## 2018-09-01 ENCOUNTER — Other Ambulatory Visit: Payer: Self-pay | Admitting: Internal Medicine

## 2018-09-26 ENCOUNTER — Other Ambulatory Visit: Payer: Self-pay | Admitting: Internal Medicine

## 2018-10-16 ENCOUNTER — Telehealth: Payer: Self-pay

## 2018-10-16 NOTE — Telephone Encounter (Signed)
COVID-19 Pre-Screening Questions:10/16/18  Do you currently have a fever (>100 F), chills or unexplained body aches?NO   . Are you currently experiencing new cough, shortness of breath, sore throat, runny nose?NO .  Have you recently travelled outside the state of [place] in the last 14 days? NO .  Have you been in contact with someone that is currently pending confirmation of Covid19 testing or has been confirmed to have the Covid19 virus? NO  **If the patient answers NO to ALL questions -  advise the patient to please call the clinic before coming to the office should any symptoms develop.     

## 2018-10-17 ENCOUNTER — Other Ambulatory Visit: Payer: Self-pay

## 2018-10-17 ENCOUNTER — Encounter: Payer: Self-pay | Admitting: Internal Medicine

## 2018-10-17 ENCOUNTER — Ambulatory Visit (INDEPENDENT_AMBULATORY_CARE_PROVIDER_SITE_OTHER): Payer: Self-pay | Admitting: Internal Medicine

## 2018-10-17 VITALS — BP 121/78 | HR 74 | Temp 98.4°F

## 2018-10-17 DIAGNOSIS — B20 Human immunodeficiency virus [HIV] disease: Secondary | ICD-10-CM

## 2018-10-17 DIAGNOSIS — N183 Chronic kidney disease, stage 3 unspecified: Secondary | ICD-10-CM

## 2018-10-17 DIAGNOSIS — Z79899 Other long term (current) drug therapy: Secondary | ICD-10-CM

## 2018-10-17 NOTE — Progress Notes (Signed)
Patient ID: Nathan Wood, male   DOB: 02-Oct-1955, 63 y.o.   MRN: [id number]  HPI Nathan Wood is a 63yo M with hiv disease, CD 4 count of 940/VL<20 in may 2020 on biktarvy. He reportsTaking meds, not missing any doses Recently, he has had dental work- pulled out all his teeth in beginning in July and waiting for dentures  Outpatient Encounter Medications as of 10/17/2018  Medication Sig  . BIKTARVY 50-200-25 MG TABS tablet TAKE 1 TABLET BY MOUTH DAILY  . fish oil-omega-3 fatty acids 1000 MG capsule Take 2 g by mouth daily.  Marland Kitchen gabapentin (NEURONTIN) 300 MG capsule Take 1 capsule (300 mg total) by mouth at bedtime.  Marland Kitchen lactulose (CHRONULAC) 10 GM/15ML solution Take 15 mLs (10 g total) by mouth daily as needed for mild constipation (until desired movement occurs).  Marland Kitchen PARoxetine (PAXIL) 20 MG tablet Take 1 tablet (20 mg total) by mouth daily. Start with 1/2 tab daily x 8 days, then increase to full tab  . traZODone (DESYREL) 100 MG tablet TAKE 1 TABLET(100 MG) BY MOUTH AT BEDTIME  . vitamin B-12 (CYANOCOBALAMIN) 100 MCG tablet Take 100 mcg by mouth daily.  . [DISCONTINUED] GENVOYA 150-150-200-10 MG TABS tablet TAKE 1 TABLET BY MOUTH DAILY WITH BREAKFAST   Facility-Administered Encounter Medications as of 10/17/2018  Medication  . lactulose (CHRONULAC) 10 GM/15ML solution 20 g     Patient Active Problem List   Diagnosis Date Noted  . Acute bronchitis 01/08/2013  . Tooth abscess 02/07/2012  . Depression, major, single episode, moderate ([place]) 07/01/2011    Class: Acute  . Hypotension 04/21/2011  . Dizziness 04/21/2011  . HIV (human immunodeficiency virus infection) ([place]) 02/10/2011  . Acute pancreatitis 02/10/2011  . Abdominal pain, acute, epigastric 02/04/2011  . Hyperproteinemia 02/04/2011  . Proteinuria 02/04/2011  . Lymphadenopathy 02/04/2011  . Anemia 02/04/2011  . Rash and nonspecific skin eruption 02/04/2011     Health Maintenance Due  Topic Date Due  . COLONOSCOPY   09/13/2005  . INFLUENZA VACCINE  10/06/2018    Social History   Tobacco Use  . Smoking status: Current Every Day Smoker    Packs/day: 0.50    Years: 10.00    Pack years: 5.00    Types: Cigarettes  . Smokeless tobacco: Never Used  . Tobacco comment: encouragement  Substance Use Topics  . Alcohol use: Yes    Alcohol/week: 14.0 standard drinks    Types: 14 Glasses of wine per week    Comment: occassional  . Drug use: No    Review of Systems Review of Systems  Constitutional: Negative for fever, chills, diaphoresis, activity change, appetite change, fatigue and unexpected weight change.  HENT: Negative for congestion, sore throat, rhinorrhea, sneezing, trouble swallowing and sinus pressure.  Eyes: Negative for photophobia and visual disturbance.  Respiratory: Negative for cough, chest tightness, shortness of breath, wheezing and stridor.  Cardiovascular: Negative for chest pain, palpitations and leg swelling.  Gastrointestinal: Negative for nausea, vomiting, abdominal pain, diarrhea, constipation, blood in stool, abdominal distention and anal bleeding.  Genitourinary: Negative for dysuria, hematuria, flank pain and difficulty urinating.  Musculoskeletal: Negative for myalgias, back pain, joint swelling, arthralgias and gait problem.  Skin: Negative for color change, pallor, rash and wound.  Neurological: Negative for dizziness, tremors, weakness and light-headedness.  Hematological: Negative for adenopathy. Does not bruise/bleed easily.  Psychiatric/Behavioral: Negative for behavioral problems, confusion, sleep disturbance, dysphoric mood, decreased concentration and agitation.    Physical Exam   BP 121/78  Pulse 74   Temp 98.4 F (36.9 C) (Oral)   SpO2 98%   Physical Exam  Constitutional: He is oriented to person, place, and time. He appears well-developed and well-nourished. No distress.  HENT: edentulous, swollen gums Mouth/Throat: Oropharynx is clear and moist. No  oropharyngeal exudate.  Cardiovascular: Normal rate, regular rhythm and normal heart sounds. Exam reveals no gallop and no friction rub.  No murmur heard.  Pulmonary/Chest: Effort normal and breath sounds normal. No respiratory distress. He has no wheezes.  Abdominal: Soft. Bowel sounds are normal. He exhibits no distension. There is no tenderness.  Lymphadenopathy:  He has no cervical adenopathy.  Neurological: He is alert and oriented to person, place, and time.  Skin: Skin is warm and dry. No rash noted. No erythema.  Psychiatric: He has a normal mood and affect. His behavior is normal.    Lab Results  Component Value Date   CD4TCELL 55 07/16/2018   Lab Results  Component Value Date   CD4TABS 940 07/16/2018   CD4TABS 1,090 04/16/2018   CD4TABS 880 12/13/2017   Lab Results  Component Value Date   HIV1RNAQUANT <20 NOT DETECTED 07/16/2018   Lab Results  Component Value Date   HEPBSAB POSITIVE (A) 02/04/2011   Lab Results  Component Value Date   LABRPR NON-REACTIVE 04/16/2018    CBC Lab Results  Component Value Date   WBC 5.7 07/16/2018   RBC 4.00 (L) 07/16/2018   HGB 13.4 07/16/2018   HCT 38.9 07/16/2018   PLT 287 07/16/2018   MCV 97.3 07/16/2018   MCH 33.5 (H) 07/16/2018   MCHC 34.4 07/16/2018   RDW 11.8 07/16/2018   LYMPHSABS 1,870 07/16/2018   MONOABS 216 04/20/2016   EOSABS 148 07/16/2018    BMET Lab Results  Component Value Date   NA 139 07/16/2018   K 4.4 07/16/2018   CL 103 07/16/2018   CO2 29 07/16/2018   GLUCOSE 82 07/16/2018   BUN 15 07/16/2018   CREATININE 1.26 (H) 07/16/2018   CALCIUM 9.7 07/16/2018   GFRNONAA 69 04/16/2018   GFRAA 80 04/16/2018      Assessment and Plan HIV disease= well controlled, continue on biktarvy  ckd 3= will continue to monitor cr to ensure at its baseline. Will check ur protein at next visit  Long term medication management = appears to tolerate his regimen. No need for any changes  Hx of depression =  well controlled

## 2018-11-13 ENCOUNTER — Encounter: Payer: Self-pay | Admitting: Internal Medicine

## 2019-01-10 ENCOUNTER — Other Ambulatory Visit: Payer: Self-pay

## 2019-01-10 DIAGNOSIS — Z79899 Other long term (current) drug therapy: Secondary | ICD-10-CM

## 2019-01-10 DIAGNOSIS — Z113 Encounter for screening for infections with a predominantly sexual mode of transmission: Secondary | ICD-10-CM

## 2019-01-10 DIAGNOSIS — B20 Human immunodeficiency virus [HIV] disease: Secondary | ICD-10-CM

## 2019-01-11 ENCOUNTER — Other Ambulatory Visit: Payer: Self-pay

## 2019-01-11 LAB — T-HELPER CELL (CD4) - (RCID CLINIC ONLY)
CD4 % Helper T Cell: 56 % (ref 33–65)
CD4 T Cell Abs: 1420 /uL (ref 400–1790)

## 2019-01-15 LAB — CBC WITH DIFFERENTIAL/PLATELET
Absolute Monocytes: 319 cells/uL (ref 200–950)
Basophils Absolute: 50 cells/uL (ref 0–200)
Basophils Relative: 0.9 %
Eosinophils Absolute: 179 cells/uL (ref 15–500)
Eosinophils Relative: 3.2 %
HCT: 41.3 % (ref 38.5–50.0)
Hemoglobin: 14 g/dL (ref 13.2–17.1)
Lymphs Abs: 2542 cells/uL (ref 850–3900)
MCH: 32.8 pg (ref 27.0–33.0)
MCHC: 33.9 g/dL (ref 32.0–36.0)
MCV: 96.7 fL (ref 80.0–100.0)
MPV: 10.7 fL (ref 7.5–12.5)
Monocytes Relative: 5.7 %
Neutro Abs: 2509 cells/uL (ref 1500–7800)
Neutrophils Relative %: 44.8 %
Platelets: 274 10*3/uL (ref 140–400)
RBC: 4.27 10*6/uL (ref 4.20–5.80)
RDW: 11.8 % (ref 11.0–15.0)
Total Lymphocyte: 45.4 %
WBC: 5.6 10*3/uL (ref 3.8–10.8)

## 2019-01-15 LAB — COMPREHENSIVE METABOLIC PANEL
AG Ratio: 1.4 (calc) (ref 1.0–2.5)
ALT: 22 U/L (ref 9–46)
AST: 18 U/L (ref 10–35)
Albumin: 4.5 g/dL (ref 3.6–5.1)
Alkaline phosphatase (APISO): 72 U/L (ref 35–144)
BUN/Creatinine Ratio: 10 (calc) (ref 6–22)
BUN: 13 mg/dL (ref 7–25)
CO2: 28 mmol/L (ref 20–32)
Calcium: 10 mg/dL (ref 8.6–10.3)
Chloride: 105 mmol/L (ref 98–110)
Creat: 1.31 mg/dL — ABNORMAL HIGH (ref 0.70–1.25)
Globulin: 3.3 g/dL (calc) (ref 1.9–3.7)
Glucose, Bld: 102 mg/dL — ABNORMAL HIGH (ref 65–99)
Potassium: 4.6 mmol/L (ref 3.5–5.3)
Sodium: 141 mmol/L (ref 135–146)
Total Bilirubin: 0.5 mg/dL (ref 0.2–1.2)
Total Protein: 7.8 g/dL (ref 6.1–8.1)

## 2019-01-15 LAB — LIPID PANEL
Cholesterol: 210 mg/dL — ABNORMAL HIGH (ref <200)
HDL: 46 mg/dL (ref >=40)
LDL Cholesterol (Calc): 136 mg/dL (calc) — ABNORMAL HIGH
Non-HDL Cholesterol (Calc): 164 mg/dL (calc) — ABNORMAL HIGH (ref <130)
Total CHOL/HDL Ratio: 4.6 (calc) (ref <5.0)
Triglycerides: 153 mg/dL — ABNORMAL HIGH (ref <150)

## 2019-01-15 LAB — RPR: RPR Ser Ql: NONREACTIVE

## 2019-01-15 LAB — HIV-1 RNA QUANT-NO REFLEX-BLD
HIV 1 RNA Quant: <20 copies/mL
HIV-1 RNA Quant, Log: <1.3 Log copies/mL

## 2019-01-17 ENCOUNTER — Other Ambulatory Visit: Payer: Self-pay

## 2019-02-06 ENCOUNTER — Encounter: Payer: Self-pay | Admitting: Internal Medicine

## 2019-02-06 ENCOUNTER — Other Ambulatory Visit: Payer: Self-pay

## 2019-02-06 ENCOUNTER — Ambulatory Visit (INDEPENDENT_AMBULATORY_CARE_PROVIDER_SITE_OTHER): Payer: Self-pay | Admitting: Internal Medicine

## 2019-02-06 VITALS — BP 137/83 | HR 48 | Temp 97.7°F | Wt 231.0 lb

## 2019-02-06 DIAGNOSIS — N183 Chronic kidney disease, stage 3 unspecified: Secondary | ICD-10-CM

## 2019-02-06 DIAGNOSIS — B20 Human immunodeficiency virus [HIV] disease: Secondary | ICD-10-CM

## 2019-02-06 DIAGNOSIS — Z23 Encounter for immunization: Secondary | ICD-10-CM

## 2019-02-06 DIAGNOSIS — R030 Elevated blood-pressure reading, without diagnosis of hypertension: Secondary | ICD-10-CM

## 2019-02-06 DIAGNOSIS — G629 Polyneuropathy, unspecified: Secondary | ICD-10-CM

## 2019-02-06 MED ORDER — GABAPENTIN 300 MG PO CAPS: 600.0000 mg | ORAL_CAPSULE | Freq: Every day | ORAL | 3 refills | Status: DC

## 2019-02-06 NOTE — Progress Notes (Signed)
RFV: follow up for hiv disease  Patient ID: Nathan Wood, male   DOB: Jul 28, 1955, 63 y.o.   MRN: [id number]  HPI 63yo M with hiv disease, CD 4 count 1420/VL<20,(nov 2020) on biktarvy. Well controlled  Shx: smokes 1/2 ppd. Drinks 2 glass per wine.   Outpatient Encounter Medications as of 02/06/2019  Medication Sig  . BIKTARVY 50-200-25 MG TABS tablet TAKE 1 TABLET BY MOUTH DAILY  . fish oil-omega-3 fatty acids 1000 MG capsule Take 2 g by mouth daily.  Marland Kitchen gabapentin (NEURONTIN) 300 MG capsule Take 1 capsule (300 mg total) by mouth at bedtime.  Marland Kitchen lactulose (CHRONULAC) 10 GM/15ML solution Take 15 mLs (10 g total) by mouth daily as needed for mild constipation (until desired movement occurs).  Marland Kitchen PARoxetine (PAXIL) 20 MG tablet Take 1 tablet (20 mg total) by mouth daily. Start with 1/2 tab daily x 8 days, then increase to full tab  . traZODone (DESYREL) 100 MG tablet TAKE 1 TABLET(100 MG) BY MOUTH AT BEDTIME  . vitamin B-12 (CYANOCOBALAMIN) 100 MCG tablet Take 100 mcg by mouth daily.   Facility-Administered Encounter Medications as of 02/06/2019  Medication  . lactulose (CHRONULAC) 10 GM/15ML solution 20 g     Patient Active Problem List   Diagnosis Date Noted  . Acute bronchitis 01/08/2013  . Tooth abscess 02/07/2012  . Depression, major, single episode, moderate ([place]) 07/01/2011    Class: Acute  . Hypotension 04/21/2011  . Dizziness 04/21/2011  . HIV (human immunodeficiency virus infection) ([place]) 02/10/2011  . Acute pancreatitis 02/10/2011  . Abdominal pain, acute, epigastric 02/04/2011  . Hyperproteinemia 02/04/2011  . Proteinuria 02/04/2011  . Lymphadenopathy 02/04/2011  . Anemia 02/04/2011  . Rash and nonspecific skin eruption 02/04/2011     Health Maintenance Due  Topic Date Due  . COLONOSCOPY  09/13/2005  . INFLUENZA VACCINE  10/06/2018     Review of Systems Review of Systems  Constitutional: Negative for fever, chills, diaphoresis, activity change, appetite  change, fatigue and unexpected weight change.  HENT: Negative for congestion, sore throat, rhinorrhea, sneezing, trouble swallowing and sinus pressure.  Eyes: Negative for photophobia and visual disturbance.  Respiratory: Negative for cough, chest tightness, shortness of breath, wheezing and stridor.  Cardiovascular: Negative for chest pain, palpitations and leg swelling.  Gastrointestinal: Negative for nausea, vomiting, abdominal pain, diarrhea, constipation, blood in stool, abdominal distention and anal bleeding.  Genitourinary: Negative for dysuria, hematuria, flank pain and difficulty urinating.  Musculoskeletal: Negative for myalgias, back pain, joint swelling, arthralgias and gait problem.  Skin: Negative for color change, pallor, rash and wound.  Neurological: + bilateral neuropathy, worse at night  Hematological: Negative for adenopathy. Does not bruise/bleed easily.  Psychiatric/Behavioral: Negative for behavioral problems, confusion, sleep disturbance, dysphoric mood, decreased concentration and agitation.    Physical Exam   BP (!) 162/79   Pulse 63   Temp 97.7 F (36.5 C)   Wt 231 lb (104.8 kg)   BMI 30.48 kg/m  Physical Exam  Constitutional: He is oriented to person, place, and time. He appears well-developed and well-nourished. No distress.  HENT:  Mouth/Throat: Oropharynx is clear and moist. No oropharyngeal exudate.  Cardiovascular: Normal rate, regular rhythm and normal heart sounds. Exam reveals no gallop and no friction rub.  No murmur heard.  Pulmonary/Chest: Effort normal and breath sounds normal. No respiratory distress. He has no wheezes.  Abdominal: Soft. Bowel sounds are normal. He exhibits no distension. There is no tenderness.  Lymphadenopathy:  He has no cervical adenopathy.  Neurological: He is alert and oriented to person, place, and time.  Skin: Skin is warm and dry. No rash noted. No erythema.  Psychiatric: He has a normal mood and affect. His  behavior is normal.     Lab Results  Component Value Date   CD4TCELL 56 01/10/2019   Lab Results  Component Value Date   CD4TABS 1,420 01/10/2019   CD4TABS 940 07/16/2018   CD4TABS 1,090 04/16/2018   Lab Results  Component Value Date   HIV1RNAQUANT <20 NOT DETECTED 01/10/2019   Lab Results  Component Value Date   HEPBSAB POSITIVE (A) 02/04/2011   Lab Results  Component Value Date   LABRPR NON-REACTIVE 01/10/2019    CBC Lab Results  Component Value Date   WBC 5.6 01/10/2019   RBC 4.27 01/10/2019   HGB 14.0 01/10/2019   HCT 41.3 01/10/2019   PLT 274 01/10/2019   MCV 96.7 01/10/2019   MCH 32.8 01/10/2019   MCHC 33.9 01/10/2019   RDW 11.8 01/10/2019   LYMPHSABS 2,542 01/10/2019   MONOABS 216 04/20/2016   EOSABS 179 01/10/2019    BMET Lab Results  Component Value Date   NA 141 01/10/2019   K 4.6 01/10/2019   CL 105 01/10/2019   CO2 28 01/10/2019   GLUCOSE 102 (H) 01/10/2019   BUN 13 01/10/2019   CREATININE 1.31 (H) 01/10/2019   CALCIUM 10.0 01/10/2019   GFRNONAA 69 04/16/2018   GFRAA 80 04/16/2018      Assessment and Plan  hiv disease = well controlled  ckd 3 = stable  Pre-htn = usually within goal. Repeat sBP 137/83. Will continue to monitor  Neuropathy =increase to 600mg  qhs or 300mg  bid (he wants to avoid sleepiness)  Health maintenance = will need flu shot- received today. Needs colonoscopy that was deferred this spring

## 2019-03-14 ENCOUNTER — Ambulatory Visit: Payer: Self-pay

## 2019-03-22 ENCOUNTER — Telehealth: Payer: Self-pay

## 2019-03-22 NOTE — Telephone Encounter (Signed)
COVID-19 Pre-Screening Questions:03/22/19   Do you currently have a fever (>100 F), chills or unexplained body aches? NO   Are you currently experiencing new cough, shortness of breath, sore throat, runny nose?NO  .  Have you recently travelled outside the state of [place] in the last 14 days? NO  .  Have you been in contact with someone that is currently pending confirmation of Covid19 testing or has been confirmed to have the Covid19 virus? NO   **If the patient answers NO to ALL questions -  advise the patient to please call the clinic before coming to the office should any symptoms develop.     

## 2019-03-25 ENCOUNTER — Ambulatory Visit: Payer: Self-pay

## 2019-03-26 ENCOUNTER — Other Ambulatory Visit: Payer: Self-pay

## 2019-03-26 ENCOUNTER — Ambulatory Visit: Payer: Self-pay

## 2019-04-19 ENCOUNTER — Encounter: Payer: Self-pay | Admitting: Internal Medicine

## 2019-04-22 ENCOUNTER — Other Ambulatory Visit: Payer: Self-pay

## 2019-04-22 MED ORDER — BIKTARVY 50-200-25 MG PO TABS: 1.0000 | ORAL_TABLET | Freq: Every day | ORAL | 4 refills | Status: DC

## 2019-04-22 MED ORDER — TRAZODONE HCL 100 MG PO TABS: ORAL_TABLET | ORAL | 2 refills | Status: DC

## 2019-04-22 NOTE — Addendum Note (Signed)
Addended by: Valarie Cones on: 04/22/2019 09:13 AM   Modules accepted: Orders

## 2019-05-06 ENCOUNTER — Other Ambulatory Visit: Payer: Self-pay

## 2019-05-06 DIAGNOSIS — Z113 Encounter for screening for infections with a predominantly sexual mode of transmission: Secondary | ICD-10-CM

## 2019-05-06 DIAGNOSIS — Z79899 Other long term (current) drug therapy: Secondary | ICD-10-CM

## 2019-05-06 DIAGNOSIS — B20 Human immunodeficiency virus [HIV] disease: Secondary | ICD-10-CM

## 2019-05-07 ENCOUNTER — Other Ambulatory Visit: Payer: Self-pay

## 2019-05-07 DIAGNOSIS — Z79899 Other long term (current) drug therapy: Secondary | ICD-10-CM

## 2019-05-07 DIAGNOSIS — Z113 Encounter for screening for infections with a predominantly sexual mode of transmission: Secondary | ICD-10-CM

## 2019-05-07 DIAGNOSIS — B20 Human immunodeficiency virus [HIV] disease: Secondary | ICD-10-CM

## 2019-05-08 LAB — URINE CYTOLOGY ANCILLARY ONLY
Chlamydia: NEGATIVE
Comment: NEGATIVE
Comment: NORMAL
Neisseria Gonorrhea: NEGATIVE

## 2019-05-08 LAB — T-HELPER CELL (CD4) - (RCID CLINIC ONLY)
CD4 % Helper T Cell: 53 % (ref 33–65)
CD4 T Cell Abs: 1033 /uL (ref 400–1790)

## 2019-05-10 LAB — COMPLETE METABOLIC PANEL WITH GFR
AG Ratio: 1.3 (calc) (ref 1.0–2.5)
ALT: 18 U/L (ref 9–46)
AST: 14 U/L (ref 10–35)
Albumin: 4.3 g/dL (ref 3.6–5.1)
Alkaline phosphatase (APISO): 70 U/L (ref 35–144)
BUN: 16 mg/dL (ref 7–25)
CO2: 29 mmol/L (ref 20–32)
Calcium: 10 mg/dL (ref 8.6–10.3)
Chloride: 105 mmol/L (ref 98–110)
Creat: 1.12 mg/dL (ref 0.70–1.25)
GFR, Est African American: 81 mL/min/{1.73_m2} (ref >=60)
GFR, Est Non African American: 70 mL/min/{1.73_m2} (ref >=60)
Globulin: 3.3 g/dL (calc) (ref 1.9–3.7)
Glucose, Bld: 86 mg/dL (ref 65–99)
Potassium: 4.5 mmol/L (ref 3.5–5.3)
Sodium: 140 mmol/L (ref 135–146)
Total Bilirubin: 0.4 mg/dL (ref 0.2–1.2)
Total Protein: 7.6 g/dL (ref 6.1–8.1)

## 2019-05-10 LAB — CBC WITH DIFFERENTIAL/PLATELET
Absolute Monocytes: 315 cells/uL (ref 200–950)
Basophils Absolute: 42 cells/uL (ref 0–200)
Basophils Relative: 0.9 %
Eosinophils Absolute: 141 cells/uL (ref 15–500)
Eosinophils Relative: 3 %
HCT: 40.1 % (ref 38.5–50.0)
Hemoglobin: 13.3 g/dL (ref 13.2–17.1)
Lymphs Abs: 1871 cells/uL (ref 850–3900)
MCH: 32.7 pg (ref 27.0–33.0)
MCHC: 33.2 g/dL (ref 32.0–36.0)
MCV: 98.5 fL (ref 80.0–100.0)
MPV: 10.7 fL (ref 7.5–12.5)
Monocytes Relative: 6.7 %
Neutro Abs: 2331 cells/uL (ref 1500–7800)
Neutrophils Relative %: 49.6 %
Platelets: 268 10*3/uL (ref 140–400)
RBC: 4.07 10*6/uL — ABNORMAL LOW (ref 4.20–5.80)
RDW: 11.7 % (ref 11.0–15.0)
Total Lymphocyte: 39.8 %
WBC: 4.7 10*3/uL (ref 3.8–10.8)

## 2019-05-10 LAB — HIV-1 RNA QUANT-NO REFLEX-BLD
HIV 1 RNA Quant: <20 copies/mL
HIV-1 RNA Quant, Log: <1.3 Log copies/mL

## 2019-05-10 LAB — LIPID PANEL
Cholesterol: 213 mg/dL — ABNORMAL HIGH (ref <200)
HDL: 40 mg/dL (ref >=40)
LDL Cholesterol (Calc): 143 mg/dL (calc) — ABNORMAL HIGH
Non-HDL Cholesterol (Calc): 173 mg/dL (calc) — ABNORMAL HIGH (ref <130)
Total CHOL/HDL Ratio: 5.3 (calc) — ABNORMAL HIGH (ref <5.0)
Triglycerides: 166 mg/dL — ABNORMAL HIGH (ref <150)

## 2019-05-10 LAB — RPR: RPR Ser Ql: NONREACTIVE

## 2019-05-22 ENCOUNTER — Encounter: Payer: Self-pay | Admitting: Internal Medicine

## 2019-05-27 ENCOUNTER — Ambulatory Visit (INDEPENDENT_AMBULATORY_CARE_PROVIDER_SITE_OTHER): Payer: Self-pay | Admitting: Internal Medicine

## 2019-05-27 ENCOUNTER — Encounter: Payer: Self-pay | Admitting: Internal Medicine

## 2019-05-27 ENCOUNTER — Other Ambulatory Visit: Payer: Self-pay

## 2019-05-27 VITALS — BP 120/80 | HR 54 | Wt 225.0 lb

## 2019-05-27 DIAGNOSIS — G629 Polyneuropathy, unspecified: Secondary | ICD-10-CM

## 2019-05-27 DIAGNOSIS — Z79899 Other long term (current) drug therapy: Secondary | ICD-10-CM

## 2019-05-27 DIAGNOSIS — B20 Human immunodeficiency virus [HIV] disease: Secondary | ICD-10-CM

## 2019-05-27 NOTE — Patient Instructions (Signed)
   COVID VACCINE SCHEDULED FOR you at [facility] this thursday   Thu., 05/30/2019 1:15pm - 1:30pm EDT Location: [facility] ([address]

## 2019-05-27 NOTE — Progress Notes (Signed)
RFV: follow up for hiv disease  Patient ID: Nathan Wood, male   DOB: November 12, 1955, 64 y.o.   MRN: [id number]  HPI  63yo M with hiv disease, cd 4 count 1033/vl< 20, march 2021, on biktarvy. post zoster neuropathy, depression who reports that he is doing well. Denies any recent exposure to covid. 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  No recent illnesses  Outpatient Encounter Medications as of 05/27/2019  Medication Sig  . bictegravir-emtricitabine-tenofovir AF (BIKTARVY) 50-200-25 MG TABS tablet Take 1 tablet by mouth daily.  . fish oil-omega-3 fatty acids 1000 MG capsule Take 2 g by mouth daily.  Marland Kitchen gabapentin (NEURONTIN) 300 MG capsule Take 2 capsules (600 mg total) by mouth at bedtime.  Marland Kitchen lactulose (CHRONULAC) 10 GM/15ML solution Take 15 mLs (10 g total) by mouth daily as needed for mild constipation (until desired movement occurs).  Marland Kitchen PARoxetine (PAXIL) 20 MG tablet Take 1 tablet (20 mg total) by mouth daily. Start with 1/2 tab daily x 8 days, then increase to full tab  . traZODone (DESYREL) 100 MG tablet TAKE 1 TABLET(100 MG) BY MOUTH AT BEDTIME  . vitamin B-12 (CYANOCOBALAMIN) 100 MCG tablet Take 100 mcg by mouth daily.   Facility-Administered Encounter Medications as of 05/27/2019  Medication  . lactulose (CHRONULAC) 10 GM/15ML solution 20 g     Patient Active Problem List   Diagnosis Date Noted  . Acute bronchitis 01/08/2013  . Tooth abscess 02/07/2012  . Depression, major, single episode, moderate (HCC) 07/01/2011    Class: Acute  . Hypotension 04/21/2011  . Dizziness 04/21/2011  . HIV (human immunodeficiency virus infection) (HCC) 02/10/2011  . Acute pancreatitis 02/10/2011  . Abdominal pain, acute, epigastric 02/04/2011  . Hyperproteinemia 02/04/2011  . Proteinuria 02/04/2011  . Lymphadenopathy 02/04/2011  . Anemia 02/04/2011  . Rash and nonspecific skin eruption 02/04/2011     Health Maintenance Due  Topic Date Due  . COLONOSCOPY  Never  done    Social History   Tobacco Use  . Smoking status: Current Every Day Smoker    Packs/day: 0.50    Years: 10.00    Pack years: 5.00    Types: Cigarettes  . Smokeless tobacco: Never Used  Substance Use Topics  . Alcohol use: Yes    Alcohol/week: 14.0 standard drinks    Types: 14 Glasses of wine per week    Comment: occassional  . Drug use: No   Review of Systems  Constitutional: Negative for fever, chills, diaphoresis, activity change, appetite change, fatigue and unexpected weight change.  HENT: Negative for congestion, sore throat, rhinorrhea, sneezing, trouble swallowing and sinus pressure.  Eyes: Negative for photophobia and visual disturbance.  Respiratory: Negative for cough, chest tightness, shortness of breath, wheezing and stridor.  Cardiovascular: Negative for chest pain, palpitations and leg swelling.  Gastrointestinal: Negative for nausea, vomiting, abdominal pain, diarrhea, constipation, blood in stool, abdominal distention and anal bleeding.  Genitourinary: Negative for dysuria, hematuria, flank pain and difficulty urinating.  Musculoskeletal: Negative for myalgias, back pain, joint swelling, arthralgias and gait problem.  Skin: Negative for color change, pallor, rash and wound.  Neurological: Negative for dizziness, tremors, weakness and light-headedness.  Hematological: Negative for adenopathy. Does not bruise/bleed easily.  Psychiatric/Behavioral: Negative for behavioral problems, confusion, sleep disturbance, dysphoric mood, decreased concentration and agitation.    Physical Exam   BP 120/80   Pulse (!) 54   Wt 225 lb (102.1 kg)   BMI 29.69 kg/m   Physical Exam  Constitutional:  He is oriented to person, place, and time. He appears well-developed and well-nourished. No distress.  HENT:  Mouth/Throat: Oropharynx is clear and moist. No oropharyngeal exudate.  Cardiovascular: Normal rate, regular rhythm and normal heart sounds. Exam reveals no gallop and  no friction rub.  No murmur heard.  Pulmonary/Chest: Effort normal and breath sounds normal. No respiratory distress. He has no wheezes. .  Lymphadenopathy:  He has no cervical adenopathy.  Neurological: He is alert and oriented to person, place, and time.  Skin: Skin is warm and dry. No rash noted. No erythema.  Psychiatric: He has a normal mood and affect. His behavior is normal.    Lab Results  Component Value Date   CD4TCELL 53 05/07/2019   Lab Results  Component Value Date   CD4TABS 1,033 05/07/2019   CD4TABS 1,420 01/10/2019   CD4TABS 940 07/16/2018   Lab Results  Component Value Date   HIV1RNAQUANT <20 NOT DETECTED 05/07/2019   Lab Results  Component Value Date   HEPBSAB POSITIVE (A) 02/04/2011   Lab Results  Component Value Date   LABRPR NON-REACTIVE 05/07/2019    CBC Lab Results  Component Value Date   WBC 4.7 05/07/2019   RBC 4.07 (L) 05/07/2019   HGB 13.3 05/07/2019   HCT 40.1 05/07/2019   PLT 268 05/07/2019   MCV 98.5 05/07/2019   MCH 32.7 05/07/2019   MCHC 33.2 05/07/2019   RDW 11.7 05/07/2019   LYMPHSABS 1,871 05/07/2019   MONOABS 216 04/20/2016   EOSABS 141 05/07/2019    BMET Lab Results  Component Value Date   NA 140 05/07/2019   K 4.5 05/07/2019   CL 105 05/07/2019   CO2 29 05/07/2019   GLUCOSE 86 05/07/2019   BUN 16 05/07/2019   CREATININE 1.12 05/07/2019   CALCIUM 10.0 05/07/2019   GFRNONAA 70 05/07/2019   GFRAA 81 05/07/2019      Assessment and Plan  HIV disease = well controlled, continue on current regimen- plan to refill on bitkarvy  Health maintenance = scheduled covid vaccine for the patient to get at [facility] this Thursday  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

## 2019-05-30 ENCOUNTER — Ambulatory Visit: Payer: Self-pay | Attending: Family

## 2019-05-30 DIAGNOSIS — Z23 Encounter for immunization: Secondary | ICD-10-CM

## 2019-05-30 NOTE — Progress Notes (Signed)
   Covid-19 Vaccination Clinic  Name:  Nathan Wood    MRN: [id number] DOB: 08/29/1955  05/30/2019  Mr. Nathan Wood was observed post Covid-19 immunization for 15 minutes without incident. He was provided with Vaccine Information Sheet and instruction to access the V-Safe system.   Mr. Nathan Wood was instructed to call 911 with any severe reactions post vaccine: Marland Kitchen Difficulty breathing  . Swelling of face and throat  . A fast heartbeat  . A bad rash all over body  . Dizziness and weakness   Immunizations Administered    Name Date Dose VIS Date Route   Moderna COVID-19 Vaccine 05/30/2019  1:17 PM 0.5 mL 02/05/2019 Intramuscular   Manufacturer: Moderna   Lot: [id number]   NDC: 01586-825-74

## 2019-06-27 ENCOUNTER — Ambulatory Visit: Payer: Self-pay | Attending: Family

## 2019-06-27 DIAGNOSIS — Z23 Encounter for immunization: Secondary | ICD-10-CM

## 2019-06-27 NOTE — Progress Notes (Signed)
   Covid-19 Vaccination Clinic  Name:  Nathan Wood    MRN: [id number] DOB: Jun 03, 1955  06/27/2019  Mr. Head was observed post Covid-19 immunization for 15 minutes without incident. He was provided with Vaccine Information Sheet and instruction to access the V-Safe system.   Mr. Rohr was instructed to call 911 with any severe reactions post vaccine: Marland Kitchen Difficulty breathing  . Swelling of face and throat  . A fast heartbeat  . A bad rash all over body  . Dizziness and weakness   Immunizations Administered    Name Date Dose VIS Date Route   Moderna COVID-19 Vaccine 06/27/2019 12:08 PM 0.5 mL 02/2019 Intramuscular   Manufacturer: Moderna   Lot: [id number]   NDC: 62263-335-45

## 2019-07-02 ENCOUNTER — Ambulatory Visit: Payer: Self-pay

## 2019-09-11 ENCOUNTER — Other Ambulatory Visit: Payer: Self-pay | Admitting: Internal Medicine

## 2019-09-12 ENCOUNTER — Telehealth: Payer: Self-pay

## 2019-09-12 NOTE — Telephone Encounter (Signed)
Patient states he has an annoying cough with sinus pressure. States he has a history of summer allergies. Patient states he is taking Delsym and mucinex with little to no relief. Denies any fevers. Patient would appreciate any advise from provider. Also set patient up with [facility] for PCP establishment.  Valarie Cones

## 2019-11-04 ENCOUNTER — Ambulatory Visit: Payer: Self-pay

## 2019-11-04 ENCOUNTER — Other Ambulatory Visit: Payer: Self-pay

## 2019-11-04 ENCOUNTER — Ambulatory Visit (INDEPENDENT_AMBULATORY_CARE_PROVIDER_SITE_OTHER): Payer: Self-pay | Admitting: Internal Medicine

## 2019-11-04 ENCOUNTER — Encounter: Payer: Self-pay | Admitting: Internal Medicine

## 2019-11-04 ENCOUNTER — Ambulatory Visit: Payer: Self-pay | Admitting: Family Medicine

## 2019-11-04 VITALS — BP 137/78 | HR 67 | Temp 98.1°F | Wt 215.0 lb

## 2019-11-04 DIAGNOSIS — Z716 Tobacco abuse counseling: Secondary | ICD-10-CM

## 2019-11-04 DIAGNOSIS — B20 Human immunodeficiency virus [HIV] disease: Secondary | ICD-10-CM

## 2019-11-04 DIAGNOSIS — E7800 Pure hypercholesterolemia, unspecified: Secondary | ICD-10-CM

## 2019-11-04 DIAGNOSIS — Z79899 Other long term (current) drug therapy: Secondary | ICD-10-CM

## 2019-11-04 MED ORDER — ROSUVASTATIN CALCIUM 10 MG PO TABS: 10.0000 mg | ORAL_TABLET | Freq: Every day | ORAL | 5 refills | Status: DC

## 2019-11-04 NOTE — Progress Notes (Signed)
RFV : follow up for hiv disease   Patient ID: Griffin Basil, male   DOB: May 28, 1955, 64 y.o.   MRN: [id number]  HPI  Duane is a 64yo M with well controlled hiv disease, CD 4 count of 1000/VL<20 on biktarvy in march 2021. He reports taking his medications regularly. Overall in good health. Has been smoking Outpatient Encounter Medications as of 11/04/2019  Medication Sig  . BIKTARVY 50-200-25 MG TABS tablet TAKE 1 TABLET BY MOUTH DAILY  . fish oil-omega-3 fatty acids 1000 MG capsule Take 2 g by mouth daily.  Marland Kitchen gabapentin (NEURONTIN) 300 MG capsule TAKE 2 CAPSULES(600 MG) BY MOUTH AT BEDTIME  . lactulose (CHRONULAC) 10 GM/15ML solution Take 15 mLs (10 g total) by mouth daily as needed for mild constipation (until desired movement occurs).  Marland Kitchen PARoxetine (PAXIL) 20 MG tablet Take 1 tablet (20 mg total) by mouth daily. Start with 1/2 tab daily x 8 days, then increase to full tab  . traZODone (DESYREL) 100 MG tablet TAKE 1 TABLET(100 MG) BY MOUTH AT BEDTIME  . vitamin B-12 (CYANOCOBALAMIN) 100 MCG tablet Take 100 mcg by mouth daily.   Facility-Administered Encounter Medications as of 11/04/2019  Medication  . lactulose (CHRONULAC) 10 GM/15ML solution 20 g     Patient Active Problem List   Diagnosis Date Noted  . Acute bronchitis 01/08/2013  . Tooth abscess 02/07/2012  . Depression, major, single episode, moderate (HCC) 07/01/2011    Class: Acute  . Hypotension 04/21/2011  . Dizziness 04/21/2011  . HIV (human immunodeficiency virus infection) (HCC) 02/10/2011  . Acute pancreatitis 02/10/2011  . Abdominal pain, acute, epigastric 02/04/2011  . Hyperproteinemia 02/04/2011  . Proteinuria 02/04/2011  . Lymphadenopathy 02/04/2011  . Anemia 02/04/2011  . Rash and nonspecific skin eruption 02/04/2011     Health Maintenance Due  Topic Date Due  . COLONOSCOPY  Never done  . INFLUENZA VACCINE  10/06/2019     Review of Systems  Physical Exam   BP 137/78   Pulse 67   Temp 98.1 F  (36.7 C)   Wt 215 lb (97.5 kg)   SpO2 97%   BMI 28.37 kg/m    Lab Results  Component Value Date   CD4TCELL 53 05/07/2019   Lab Results  Component Value Date   CD4TABS 1,033 05/07/2019   CD4TABS 1,420 01/10/2019   CD4TABS 940 07/16/2018   Lab Results  Component Value Date   HIV1RNAQUANT <20 NOT DETECTED 05/07/2019   Lab Results  Component Value Date   HEPBSAB POSITIVE (A) 02/04/2011   Lab Results  Component Value Date   LABRPR NON-REACTIVE 05/07/2019    CBC Lab Results  Component Value Date   WBC 4.7 05/07/2019   RBC 4.07 (L) 05/07/2019   HGB 13.3 05/07/2019   HCT 40.1 05/07/2019   PLT 268 05/07/2019   MCV 98.5 05/07/2019   MCH 32.7 05/07/2019   MCHC 33.2 05/07/2019   RDW 11.7 05/07/2019   LYMPHSABS 1,871 05/07/2019   MONOABS 216 04/20/2016   EOSABS 141 05/07/2019    BMET Lab Results  Component Value Date   NA 140 05/07/2019   K 4.5 05/07/2019   CL 105 05/07/2019   CO2 29 05/07/2019   GLUCOSE 86 05/07/2019   BUN 16 05/07/2019   CREATININE 1.12 05/07/2019   CALCIUM 10.0 05/07/2019   GFRNONAA 70 05/07/2019   GFRAA 81 05/07/2019      Assessment and Plan  hiv disease= will check labs to see that he is still well  controlled  Smoking cessation counseling = spent 5 min discussing risk reduction.  Long term medication management = will check cr function  Hypercholesteremia = will start crestor 10 mg daily  Health maintenance = needs booster in nov -dec with moderna since immunocompromised host. Will need flu shot in sept-oct.

## 2019-11-05 LAB — T-HELPER CELL (CD4) - (RCID CLINIC ONLY)
CD4 % Helper T Cell: 57 % (ref 33–65)
CD4 T Cell Abs: 1246 /uL (ref 400–1790)

## 2019-11-07 LAB — COMPLETE METABOLIC PANEL WITH GFR
AG Ratio: 1.3 (calc) (ref 1.0–2.5)
ALT: 9 U/L (ref 9–46)
AST: 13 U/L (ref 10–35)
Albumin: 4.5 g/dL (ref 3.6–5.1)
Alkaline phosphatase (APISO): 69 U/L (ref 35–144)
BUN: 15 mg/dL (ref 7–25)
CO2: 30 mmol/L (ref 20–32)
Calcium: 10 mg/dL (ref 8.6–10.3)
Chloride: 105 mmol/L (ref 98–110)
Creat: 1.23 mg/dL (ref 0.70–1.25)
GFR, Est African American: 71 mL/min/{1.73_m2} (ref >=60)
GFR, Est Non African American: 62 mL/min/{1.73_m2} (ref >=60)
Globulin: 3.4 g/dL (calc) (ref 1.9–3.7)
Glucose, Bld: 81 mg/dL (ref 65–99)
Potassium: 4.2 mmol/L (ref 3.5–5.3)
Sodium: 141 mmol/L (ref 135–146)
Total Bilirubin: 0.7 mg/dL (ref 0.2–1.2)
Total Protein: 7.9 g/dL (ref 6.1–8.1)

## 2019-11-07 LAB — CBC WITH DIFFERENTIAL/PLATELET
Absolute Monocytes: 342 cells/uL (ref 200–950)
Basophils Absolute: 50 cells/uL (ref 0–200)
Basophils Relative: 1.1 %
Eosinophils Absolute: 171 cells/uL (ref 15–500)
Eosinophils Relative: 3.8 %
HCT: 42.9 % (ref 38.5–50.0)
Hemoglobin: 14.4 g/dL (ref 13.2–17.1)
Lymphs Abs: 2358 cells/uL (ref 850–3900)
MCH: 32.8 pg (ref 27.0–33.0)
MCHC: 33.6 g/dL (ref 32.0–36.0)
MCV: 97.7 fL (ref 80.0–100.0)
MPV: 11 fL (ref 7.5–12.5)
Monocytes Relative: 7.6 %
Neutro Abs: 1580 cells/uL (ref 1500–7800)
Neutrophils Relative %: 35.1 %
Platelets: 259 10*3/uL (ref 140–400)
RBC: 4.39 10*6/uL (ref 4.20–5.80)
RDW: 11.7 % (ref 11.0–15.0)
Total Lymphocyte: 52.4 %
WBC: 4.5 10*3/uL (ref 3.8–10.8)

## 2019-11-07 LAB — HIV-1 RNA QUANT-NO REFLEX-BLD
HIV 1 RNA Quant: <20 Copies/mL — ABNORMAL HIGH
HIV-1 RNA Quant, Log: <1.3 Log cps/mL — ABNORMAL HIGH

## 2019-11-07 LAB — RPR: RPR Ser Ql: NONREACTIVE

## 2019-11-15 ENCOUNTER — Other Ambulatory Visit: Payer: Self-pay | Admitting: Internal Medicine

## 2020-01-02 ENCOUNTER — Other Ambulatory Visit: Payer: Self-pay | Admitting: Internal Medicine

## 2020-01-02 ENCOUNTER — Other Ambulatory Visit: Payer: Self-pay

## 2020-01-02 ENCOUNTER — Ambulatory Visit: Payer: Self-pay

## 2020-02-11 ENCOUNTER — Encounter: Payer: Self-pay | Admitting: Internal Medicine

## 2020-02-26 ENCOUNTER — Telehealth: Payer: Self-pay

## 2020-02-26 NOTE — Telephone Encounter (Signed)
Patient called stating he had a possible COVID exposure. Patient asked if he should be tested. Patient has been advised that he will need to be tested. Patient stated he would find a testing site in [facility] where he lives. Gaelle Adriance T Pricilla Loveless

## 2020-03-09 ENCOUNTER — Telehealth: Payer: Self-pay | Admitting: Internal Medicine

## 2020-05-27 ENCOUNTER — Ambulatory Visit: Payer: Self-pay

## 2020-05-27 ENCOUNTER — Ambulatory Visit: Payer: Self-pay | Admitting: Internal Medicine

## 2020-06-12 ENCOUNTER — Other Ambulatory Visit: Payer: Self-pay | Admitting: Internal Medicine

## 2020-06-22 ENCOUNTER — Other Ambulatory Visit: Payer: Self-pay

## 2020-06-22 ENCOUNTER — Ambulatory Visit: Payer: Self-pay

## 2020-06-22 ENCOUNTER — Telehealth: Payer: Self-pay

## 2020-06-22 DIAGNOSIS — B20 Human immunodeficiency virus [HIV] disease: Secondary | ICD-10-CM

## 2020-06-22 DIAGNOSIS — Z79899 Other long term (current) drug therapy: Secondary | ICD-10-CM

## 2020-06-22 DIAGNOSIS — Z113 Encounter for screening for infections with a predominantly sexual mode of transmission: Secondary | ICD-10-CM

## 2020-06-22 NOTE — Telephone Encounter (Signed)
[facility] Encounter  We gave patient 1 bottle (7 day supply) of Biktarvy sample.  Nathaneil Feagans, CPhT [facility] Phone: [phone] Fax:  [phone]   

## 2020-06-23 LAB — URINE CYTOLOGY ANCILLARY ONLY
Chlamydia: NEGATIVE
Comment: NEGATIVE
Comment: NORMAL
Neisseria Gonorrhea: NEGATIVE

## 2020-06-23 LAB — T-HELPER CELL (CD4) - (RCID CLINIC ONLY)
CD4 % Helper T Cell: 52 % (ref 33–65)
CD4 T Cell Abs: 1061 /uL (ref 400–1790)

## 2020-06-25 LAB — CBC WITH DIFFERENTIAL/PLATELET
Absolute Monocytes: 268 cells/uL (ref 200–950)
Basophils Absolute: 48 cells/uL (ref 0–200)
Basophils Relative: 1.2 %
Eosinophils Absolute: 208 cells/uL (ref 15–500)
Eosinophils Relative: 5.2 %
HCT: 41 % (ref 38.5–50.0)
Hemoglobin: 13.6 g/dL (ref 13.2–17.1)
Lymphs Abs: 2136 cells/uL (ref 850–3900)
MCH: 32.1 pg (ref 27.0–33.0)
MCHC: 33.2 g/dL (ref 32.0–36.0)
MCV: 96.7 fL (ref 80.0–100.0)
MPV: 10.7 fL (ref 7.5–12.5)
Monocytes Relative: 6.7 %
Neutro Abs: 1340 cells/uL — ABNORMAL LOW (ref 1500–7800)
Neutrophils Relative %: 33.5 %
Platelets: 257 10*3/uL (ref 140–400)
RBC: 4.24 10*6/uL (ref 4.20–5.80)
RDW: 11.6 % (ref 11.0–15.0)
Total Lymphocyte: 53.4 %
WBC: 4 10*3/uL (ref 3.8–10.8)

## 2020-06-25 LAB — LIPID PANEL
Cholesterol: 176 mg/dL (ref <200)
HDL: 49 mg/dL (ref >=40)
LDL Cholesterol (Calc): 105 mg/dL (calc) — ABNORMAL HIGH
Non-HDL Cholesterol (Calc): 127 mg/dL (calc) (ref <130)
Total CHOL/HDL Ratio: 3.6 (calc) (ref <5.0)
Triglycerides: 123 mg/dL (ref <150)

## 2020-06-25 LAB — HIV-1 RNA QUANT-NO REFLEX-BLD
HIV 1 RNA Quant: <20 Copies/mL — ABNORMAL HIGH
HIV-1 RNA Quant, Log: <1.3 Log cps/mL — ABNORMAL HIGH

## 2020-06-25 LAB — RPR: RPR Ser Ql: NONREACTIVE

## 2020-06-25 LAB — COMPREHENSIVE METABOLIC PANEL
AG Ratio: 1.7 (calc) (ref 1.0–2.5)
ALT: 9 U/L (ref 9–46)
AST: 12 U/L (ref 10–35)
Albumin: 4.6 g/dL (ref 3.6–5.1)
Alkaline phosphatase (APISO): 67 U/L (ref 35–144)
BUN: 18 mg/dL (ref 7–25)
CO2: 31 mmol/L (ref 20–32)
Calcium: 9.6 mg/dL (ref 8.6–10.3)
Chloride: 105 mmol/L (ref 98–110)
Creat: 1.15 mg/dL (ref 0.70–1.25)
Globulin: 2.7 g/dL (calc) (ref 1.9–3.7)
Glucose, Bld: 80 mg/dL (ref 65–99)
Potassium: 4.5 mmol/L (ref 3.5–5.3)
Sodium: 140 mmol/L (ref 135–146)
Total Bilirubin: 0.4 mg/dL (ref 0.2–1.2)
Total Protein: 7.3 g/dL (ref 6.1–8.1)

## 2020-06-26 ENCOUNTER — Encounter: Payer: Self-pay | Admitting: Internal Medicine

## 2020-06-30 ENCOUNTER — Other Ambulatory Visit: Payer: Self-pay

## 2020-06-30 MED ORDER — TRAZODONE HCL 100 MG PO TABS: ORAL_TABLET | ORAL | 1 refills | Status: DC

## 2020-07-17 ENCOUNTER — Encounter: Payer: Self-pay | Admitting: Internal Medicine

## 2020-07-17 ENCOUNTER — Ambulatory Visit: Payer: Self-pay | Admitting: Internal Medicine

## 2020-07-17 ENCOUNTER — Ambulatory Visit (INDEPENDENT_AMBULATORY_CARE_PROVIDER_SITE_OTHER): Payer: Self-pay | Admitting: Internal Medicine

## 2020-07-17 ENCOUNTER — Other Ambulatory Visit: Payer: Self-pay

## 2020-07-17 VITALS — BP 122/70 | HR 64 | Resp 16 | Ht 73.0 in | Wt 215.0 lb

## 2020-07-17 DIAGNOSIS — Z7189 Other specified counseling: Secondary | ICD-10-CM

## 2020-07-17 DIAGNOSIS — B20 Human immunodeficiency virus [HIV] disease: Secondary | ICD-10-CM

## 2020-07-17 DIAGNOSIS — Z79899 Other long term (current) drug therapy: Secondary | ICD-10-CM

## 2020-07-17 DIAGNOSIS — Z716 Tobacco abuse counseling: Secondary | ICD-10-CM

## 2020-07-17 DIAGNOSIS — E7800 Pure hypercholesterolemia, unspecified: Secondary | ICD-10-CM

## 2020-07-17 NOTE — Progress Notes (Signed)
RFV: follow up for hiv disease  Patient ID: Nathan Wood, male   DOB: 1955/03/25, 65 y.o.   MRN: [id number]  HPI 65yo M with hiv disease, well controlled. On biktarvy - cd 4 count of 1061/VL<20. No missing doses. Continues to work at [employer]. Has not had covid close contact. Overall doing well. Has not had any health issues.  Has 2 doses of moderna, not yet had booster  Outpatient Encounter Medications as of 07/17/2020  Medication Sig  . BIKTARVY 50-200-25 MG TABS tablet TAKE 1 TABLET BY MOUTH DAILY  . fish oil-omega-3 fatty acids 1000 MG capsule Take 2 g by mouth daily.  Marland Kitchen gabapentin (NEURONTIN) 300 MG capsule TAKE 2 CAPSULES(600 MG) BY MOUTH AT BEDTIME  . lactulose (CHRONULAC) 10 GM/15ML solution Take 15 mLs (10 g total) by mouth daily as needed for mild constipation (until desired movement occurs).  Marland Kitchen PARoxetine (PAXIL) 20 MG tablet Take 1 tablet (20 mg total) by mouth daily. Start with 1/2 tab daily x 8 days, then increase to full tab  . rosuvastatin (CRESTOR) 10 MG tablet Take 1 tablet (10 mg total) by mouth daily.  . traZODone (DESYREL) 100 MG tablet TAKE 1 TABLET(100 MG) BY MOUTH AT BEDTIME  . vitamin B-12 (CYANOCOBALAMIN) 100 MCG tablet Take 100 mcg by mouth daily.   Facility-Administered Encounter Medications as of 07/17/2020  Medication  . lactulose (CHRONULAC) 10 GM/15ML solution 20 g     Patient Active Problem List   Diagnosis Date Noted  . Acute bronchitis 01/08/2013  . Tooth abscess 02/07/2012  . Depression, major, single episode, moderate (HCC) 07/01/2011    Class: Acute  . Hypotension 04/21/2011  . Dizziness 04/21/2011  . HIV (human immunodeficiency virus infection) (HCC) 02/10/2011  . Acute pancreatitis 02/10/2011  . Abdominal pain, acute, epigastric 02/04/2011  . Hyperproteinemia 02/04/2011  . Proteinuria 02/04/2011  . Lymphadenopathy 02/04/2011  . Anemia 02/04/2011  . Rash and nonspecific skin eruption 02/04/2011     Health Maintenance Due  Topic Date  Due  . COLONOSCOPY (Pts 45-76yrs Insurance coverage will need to be confirmed)  Never done  . COVID-19 Vaccine (3 - Moderna risk 4-dose series) 07/25/2019    Sochx: continues to smoke Review of Systems  Constitutional: Negative for fever, chills, diaphoresis, activity change, appetite change, fatigue and unexpected weight change.  HENT: Negative for congestion, sore throat, rhinorrhea, sneezing, trouble swallowing and sinus pressure.  Eyes: Negative for photophobia and visual disturbance.  Respiratory: Negative for cough, chest tightness, shortness of breath, wheezing and stridor.  Cardiovascular: Negative for chest pain, palpitations and leg swelling.  Gastrointestinal: Negative for nausea, vomiting, abdominal pain, diarrhea, constipation, blood in stool, abdominal distention and anal bleeding.  Genitourinary: Negative for dysuria, hematuria, flank pain and difficulty urinating.  Musculoskeletal: Negative for myalgias, back pain, joint swelling, arthralgias and gait problem.  Skin: Negative for color change, pallor, rash and wound.  Neurological: Negative for dizziness, tremors, weakness and light-headedness.  Hematological: Negative for adenopathy. Does not bruise/bleed easily.  Psychiatric/Behavioral: Negative for behavioral problems, confusion, sleep disturbance, dysphoric mood, decreased concentration and agitation.    Physical Exam   BP 122/70   Pulse 64   Resp 16   Ht 6\' 1"  (1.854 m)   Wt 215 lb (97.5 kg)   SpO2 100%   BMI 28.37 kg/m   Physical Exam  Constitutional: He is oriented to person, place, and time. He appears well-developed and well-nourished. No distress.  HENT: edentulous Mouth/Throat: Oropharynx is clear and moist. No oropharyngeal exudate.  Cardiovascular: Normal rate, regular rhythm and normal heart sounds. Exam reveals no gallop and no friction rub.  No murmur heard.  Pulmonary/Chest: Effort normal and breath sounds normal. No respiratory distress. He has  no wheezes.  Abdominal: Soft. Bowel sounds are normal. He exhibits no distension. There is no tenderness.  Lymphadenopathy:  He has no cervical adenopathy.  Neurological: He is alert and oriented to person, place, and time.  Skin: Skin is warm and dry. No rash noted. No erythema.  Psychiatric: He has a normal mood and affect. His behavior is normal.    Lab Results  Component Value Date   CD4TCELL 52 06/22/2020   Lab Results  Component Value Date   CD4TABS 1,061 06/22/2020   CD4TABS 1,246 11/04/2019   CD4TABS 1,033 05/07/2019   Lab Results  Component Value Date   HIV1RNAQUANT <20 (H) 06/22/2020   Lab Results  Component Value Date   HEPBSAB POSITIVE (A) 02/04/2011   Lab Results  Component Value Date   LABRPR NON-REACTIVE 06/22/2020    CBC Lab Results  Component Value Date   WBC 4.0 06/22/2020   RBC 4.24 06/22/2020   HGB 13.6 06/22/2020   HCT 41.0 06/22/2020   PLT 257 06/22/2020   MCV 96.7 06/22/2020   MCH 32.1 06/22/2020   MCHC 33.2 06/22/2020   RDW 11.6 06/22/2020   LYMPHSABS 2,136 06/22/2020   MONOABS 216 04/20/2016   EOSABS 208 06/22/2020    BMET Lab Results  Component Value Date   NA 140 06/22/2020   K 4.5 06/22/2020   CL 105 06/22/2020   CO2 31 06/22/2020   GLUCOSE 80 06/22/2020   BUN 18 06/22/2020   CREATININE 1.15 06/22/2020   CALCIUM 9.6 06/22/2020   GFRNONAA 62 11/04/2019   GFRAA 71 11/04/2019      Assessment and Plan hiv disease = well controlled   Health promotion = needs colonoscopy and covid booster  Long term medication management = cr is stable  Hyper cholesterol/lipidemia = continue on crestor  Smoking cessation = counseled on benefits of smoking cessation x 8 min

## 2020-09-18 ENCOUNTER — Other Ambulatory Visit: Payer: Self-pay | Admitting: Internal Medicine

## 2020-09-18 ENCOUNTER — Telehealth: Payer: Self-pay

## 2020-09-18 NOTE — Telephone Encounter (Signed)
Patient called to request Trazodone refills. After review of chart, scheduled follow up, and med list I sent in refills and reminded of appointment in October.

## 2020-09-29 ENCOUNTER — Telehealth: Payer: Self-pay

## 2020-09-29 ENCOUNTER — Encounter: Payer: Self-pay | Admitting: Internal Medicine

## 2020-09-29 ENCOUNTER — Other Ambulatory Visit: Payer: Self-pay

## 2020-09-29 ENCOUNTER — Ambulatory Visit (INDEPENDENT_AMBULATORY_CARE_PROVIDER_SITE_OTHER): Payer: Medicare Other | Admitting: Internal Medicine

## 2020-09-29 VITALS — Wt 220.0 lb

## 2020-09-29 DIAGNOSIS — B20 Human immunodeficiency virus [HIV] disease: Secondary | ICD-10-CM | POA: Diagnosis not present

## 2020-09-29 DIAGNOSIS — Z79899 Other long term (current) drug therapy: Secondary | ICD-10-CM

## 2020-09-29 DIAGNOSIS — M7989 Other specified soft tissue disorders: Secondary | ICD-10-CM | POA: Diagnosis not present

## 2020-09-29 NOTE — Telephone Encounter (Signed)
Patient walked into clinic today requesting samples for Biktarvy and to discuss knee swelling. Patient states that he did not pack enough medication while he is away from home. Would like to know if office could provide samples for him to until he can return home Wednesday. Provided patient with 7 day sample.  He would also like to inform MD that he has noticed right knee swelling these past couple of weeks. States that he does experience knee pain after sitting at desk too long. Would like to know what MD recommends he do. Scheduled same day appt with Dr. Drue Second for evaluation.   Juanita Laster, RMA

## 2020-09-29 NOTE — Telephone Encounter (Signed)
Medication Samples have been provided to the patient.  Drug name: Biktarvy        Strength: 50/200/25 mg       Qty: 7 (1 bottle)   LOT: [id number]   Exp.Date: 06/24  Dosing instructions: Take one tablet by mouth once daily  Benn Tarver, CPhT [facility] Phone: [phone] Fax:  [phone]  

## 2020-09-29 NOTE — Progress Notes (Signed)
RFV: follow up for HIV disease  Patient ID: Nathan Wood, male   DOB: 1955-09-02, 65 y.o.   MRN: [id number]  HPI 65yo. M with HIV disease, Cd 4 count  1,026/VL<20 on biktarvy. He reports the does well with adherence. The week prior to visit, he has had more left leg swelling and knee pain. He has been taking gabapentin which has helped somewhat. Today, there is less swelling to his knee joint but more swelling to calf on exam.  Soc hx: occasional smoking. 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   gabapentin (NEURONTIN) 300 MG capsule TAKE 2 CAPSULES(600 MG) BY MOUTH AT BEDTIME   lactulose (CHRONULAC) 10 GM/15ML solution Take 15 mLs (10 g total) by mouth daily as needed for mild constipation (until desired movement occurs).   PARoxetine (PAXIL) 20 MG tablet Take 1 tablet (20 mg total) by mouth daily. Start with 1/2 tab daily x 8 days, then increase to full tab   rosuvastatin (CRESTOR) 10 MG tablet Take 1 tablet (10 mg total) by mouth daily.   traZODone (DESYREL) 100 MG tablet TAKE 1 TABLET(100 MG) BY MOUTH AT BEDTIME   vitamin B-12 (CYANOCOBALAMIN) 100 MCG tablet Take 100 mcg by mouth daily.   Facility-Administered Encounter Medications as of 09/29/2020  Medication   lactulose (CHRONULAC) 10 GM/15ML solution 20 g     Patient Active Problem List   Diagnosis Date Noted   Acute bronchitis 01/08/2013   Tooth abscess 02/07/2012   Depression, major, single episode, moderate (HCC) 07/01/2011    Class: Acute   Hypotension 04/21/2011   Dizziness 04/21/2011   HIV (human immunodeficiency virus infection) (HCC) 02/10/2011   Acute pancreatitis 02/10/2011   Abdominal pain, acute, epigastric  02/04/2011   Hyperproteinemia 02/04/2011   Proteinuria 02/04/2011   Lymphadenopathy 02/04/2011   Anemia 02/04/2011   Rash and nonspecific skin eruption 02/04/2011     Health Maintenance Due  Topic Date Due   Zoster Vaccines- Shingrix (1 of 2) Never done   COLONOSCOPY (Pts 45-18yrs Insurance coverage will need to be confirmed)  Never done   COVID-19 Vaccine (3 - Moderna risk series) 07/25/2019     Review of Systems 12 point ros is negative except what is mentioned in hpi. Physical Exam  Wt 220 lb (99.8 kg)   BMI 29.03 kg/m  Physical Exam  Constitutional: He is oriented to person, place, and time. He appears well-developed and well-nourished. No distress.  HENT:  Mouth/Throat: Oropharynx is clear and moist. No oropharyngeal exudate.  Cardiovascular: Normal rate, regular rhythm and normal heart sounds. Exam reveals no gallop and no friction rub.  No murmur heard.  Pulmonary/Chest: Effort normal and breath sounds normal. No respiratory distress. He has no wheezes.  Ext: left leg swelling. Some pronounced veins on left leg. Fluid blister at arch of foot. No effusion to joint Neurological: He is alert and oriented to person, place, and time.  Skin: Skin is warm and dry. No rash noted. No erythema.  Psychiatric: He has a normal mood and affect. His behavior is normal.   Lab Results  Component Value Date   CD4TCELL 52 06/22/2020   Lab Results  Component Value Date   CD4TABS 1,061 06/22/2020   CD4TABS 1,246 11/04/2019   CD4TABS 1,033 05/07/2019   Lab Results  Component Value Date   HIV1RNAQUANT <20 (H) 06/22/2020   Lab Results  Component Value Date   HEPBSAB POSITIVE (A) 02/04/2011   Lab Results  Component Value Date   LABRPR NON-REACTIVE 06/22/2020    CBC Lab Results  Component Value Date   WBC 4.0 06/22/2020   RBC 4.24 06/22/2020   HGB 13.6 06/22/2020   HCT 41.0 06/22/2020   PLT 257 06/22/2020   MCV 96.7 06/22/2020   MCH 32.1 06/22/2020   MCHC 33.2  06/22/2020   RDW 11.6 06/22/2020   LYMPHSABS 2,136 06/22/2020   MONOABS 216 04/20/2016   EOSABS 208 06/22/2020    BMET Lab Results  Component Value Date   NA 140 06/22/2020   K 4.5 06/22/2020   CL 105 06/22/2020   CO2 31 06/22/2020   GLUCOSE 80 06/22/2020   BUN 18 06/22/2020   CREATININE 1.15 06/22/2020   CALCIUM 9.6 06/22/2020   GFRNONAA 62 11/04/2019   GFRAA 71 11/04/2019      Assessment and Plan Left calf swelling = sounds like this has started in the last week, potentionally has been intermittent. Still plan to Rule out DVT on left calf  Can treat pain with ibuprofen PRN, pain  Stop gabapentin ( for swelling) - temporarily  Hiv disease = taking biktarvy daily. Will check labs today  Long term medication management = will check cr  Health maintenance = recommend for him to get 2nd booster at grocery store  Addendum == DVT ruled out

## 2020-09-30 ENCOUNTER — Telehealth: Payer: Self-pay

## 2020-09-30 ENCOUNTER — Ambulatory Visit (HOSPITAL_COMMUNITY)
Admission: RE | Admit: 2020-09-30 | Discharge: 2020-09-30 | Disposition: A | Discharge status: Home / Self Care | Payer: Medicare Other | Source: Ambulatory Visit | Attending: Internal Medicine | Admitting: Internal Medicine

## 2020-09-30 DIAGNOSIS — B20 Human immunodeficiency virus [HIV] disease: Secondary | ICD-10-CM | POA: Diagnosis present

## 2020-09-30 LAB — T-HELPER CELL (CD4) - (RCID CLINIC ONLY)
CD4 % Helper T Cell: 57 % (ref 33–65)
CD4 T Cell Abs: 1227 /uL (ref 400–1790)

## 2020-09-30 NOTE — Progress Notes (Signed)
Left lower extremity venous duplex has been completed. Preliminary results can be found in CV Proc through chart review.  Results were given to Dr. Drue Second.  09/30/20 9:47 AM Olen Cordial RVT

## 2020-09-30 NOTE — Telephone Encounter (Signed)
Spoke with patient to advise him per Dr. Drue Second that his ultrasound was negative for any clots and that she would like him to continue stopping the gabapentin to see if that was contributing. He is asking if Dr. Drue Second sent in a "fluid pill" for him yesterday. Advised him that there is no record of that. Patient verbalized understanding and has no further questions.   [name] Ano, RN

## 2020-10-02 LAB — RPR: RPR Ser Ql: NONREACTIVE

## 2020-10-02 LAB — HIV-1 RNA QUANT-NO REFLEX-BLD
HIV 1 RNA Quant: NOT DETECTED Copies/mL
HIV-1 RNA Quant, Log: NOT DETECTED Log cps/mL

## 2020-10-02 LAB — COMPLETE METABOLIC PANEL WITH GFR
AG Ratio: 1.4 (calc) (ref 1.0–2.5)
ALT: 11 U/L (ref 9–46)
AST: 13 U/L (ref 10–35)
Albumin: 4.2 g/dL (ref 3.6–5.1)
Alkaline phosphatase (APISO): 68 U/L (ref 35–144)
BUN: 18 mg/dL (ref 7–25)
CO2: 29 mmol/L (ref 20–32)
Calcium: 9.7 mg/dL (ref 8.6–10.3)
Chloride: 107 mmol/L (ref 98–110)
Creat: 1.33 mg/dL (ref 0.70–1.35)
Globulin: 3.1 g/dL (calc) (ref 1.9–3.7)
Glucose, Bld: 91 mg/dL (ref 65–99)
Potassium: 4.5 mmol/L (ref 3.5–5.3)
Sodium: 140 mmol/L (ref 135–146)
Total Bilirubin: 0.3 mg/dL (ref 0.2–1.2)
Total Protein: 7.3 g/dL (ref 6.1–8.1)
eGFR: 59 mL/min/{1.73_m2} — ABNORMAL LOW (ref >=60)

## 2020-10-02 LAB — CBC WITH DIFFERENTIAL/PLATELET
Absolute Monocytes: 289 cells/uL (ref 200–950)
Basophils Absolute: 59 cells/uL (ref 0–200)
Basophils Relative: 1.2 %
Eosinophils Absolute: 196 cells/uL (ref 15–500)
Eosinophils Relative: 4 %
HCT: 38 % — ABNORMAL LOW (ref 38.5–50.0)
Hemoglobin: 12.9 g/dL — ABNORMAL LOW (ref 13.2–17.1)
Lymphs Abs: 2303 cells/uL (ref 850–3900)
MCH: 33.1 pg — ABNORMAL HIGH (ref 27.0–33.0)
MCHC: 33.9 g/dL (ref 32.0–36.0)
MCV: 97.4 fL (ref 80.0–100.0)
MPV: 11 fL (ref 7.5–12.5)
Monocytes Relative: 5.9 %
Neutro Abs: 2053 cells/uL (ref 1500–7800)
Neutrophils Relative %: 41.9 %
Platelets: 254 10*3/uL (ref 140–400)
RBC: 3.9 10*6/uL — ABNORMAL LOW (ref 4.20–5.80)
RDW: 11.7 % (ref 11.0–15.0)
Total Lymphocyte: 47 %
WBC: 4.9 10*3/uL (ref 3.8–10.8)

## 2020-10-14 ENCOUNTER — Other Ambulatory Visit: Payer: Self-pay | Admitting: Internal Medicine

## 2020-10-27 ENCOUNTER — Encounter: Payer: Self-pay | Admitting: Internal Medicine

## 2020-11-30 ENCOUNTER — Encounter: Payer: Self-pay | Admitting: Gastroenterology

## 2021-01-18 ENCOUNTER — Other Ambulatory Visit: Payer: Self-pay

## 2021-01-21 ENCOUNTER — Telehealth: Payer: Self-pay

## 2021-01-21 NOTE — Telephone Encounter (Signed)
Call to pt as he missed his previsit today at 230, unable to leave msg, will send letter

## 2021-01-22 ENCOUNTER — Encounter: Payer: Self-pay | Admitting: Internal Medicine

## 2021-02-01 ENCOUNTER — Encounter: Payer: Self-pay | Admitting: Internal Medicine

## 2021-02-04 ENCOUNTER — Encounter: Payer: Medicare Other | Admitting: Gastroenterology

## 2021-02-19 ENCOUNTER — Other Ambulatory Visit: Payer: Self-pay

## 2021-02-19 DIAGNOSIS — B20 Human immunodeficiency virus [HIV] disease: Secondary | ICD-10-CM

## 2021-02-19 DIAGNOSIS — Z113 Encounter for screening for infections with a predominantly sexual mode of transmission: Secondary | ICD-10-CM

## 2021-02-19 DIAGNOSIS — Z79899 Other long term (current) drug therapy: Secondary | ICD-10-CM

## 2021-02-22 ENCOUNTER — Other Ambulatory Visit: Payer: Medicare Other

## 2021-03-15 ENCOUNTER — Encounter: Payer: Medicare Other | Admitting: Internal Medicine

## 2021-03-22 ENCOUNTER — Ambulatory Visit: Payer: Self-pay | Admitting: Internal Medicine

## 2021-03-28 ENCOUNTER — Other Ambulatory Visit: Payer: Self-pay | Admitting: Internal Medicine

## 2021-04-01 ENCOUNTER — Other Ambulatory Visit: Payer: Self-pay | Admitting: Internal Medicine

## 2021-04-01 NOTE — Telephone Encounter (Signed)
Please advise on refill.

## 2021-04-12 NOTE — Telephone Encounter (Signed)
Appt 2/14

## 2021-04-20 ENCOUNTER — Ambulatory Visit: Payer: Self-pay | Admitting: Internal Medicine

## 2021-04-26 NOTE — Telephone Encounter (Signed)
Appt 2/21

## 2021-04-27 ENCOUNTER — Other Ambulatory Visit: Payer: Self-pay

## 2021-04-27 ENCOUNTER — Encounter: Payer: Self-pay | Admitting: Internal Medicine

## 2021-04-27 ENCOUNTER — Ambulatory Visit: Payer: Self-pay

## 2021-04-27 ENCOUNTER — Ambulatory Visit (INDEPENDENT_AMBULATORY_CARE_PROVIDER_SITE_OTHER): Payer: Self-pay | Admitting: Internal Medicine

## 2021-04-27 VITALS — BP 122/79 | HR 60 | Temp 98.3°F | Ht 73.0 in | Wt 215.0 lb

## 2021-04-27 DIAGNOSIS — B20 Human immunodeficiency virus [HIV] disease: Secondary | ICD-10-CM

## 2021-04-27 DIAGNOSIS — Z79899 Other long term (current) drug therapy: Secondary | ICD-10-CM

## 2021-04-27 DIAGNOSIS — T148XXA Other injury of unspecified body region, initial encounter: Secondary | ICD-10-CM

## 2021-04-27 MED ORDER — ROSUVASTATIN CALCIUM 10 MG PO TABS: 10.0000 mg | ORAL_TABLET | Freq: Every day | ORAL | 5 refills | Status: AC

## 2021-04-27 MED ORDER — BIKTARVY 50-200-25 MG PO TABS: 1.0000 | ORAL_TABLET | Freq: Every day | ORAL | 11 refills | Status: DC

## 2021-04-27 MED ORDER — METHOCARBAMOL 500 MG PO TABS: 500.0000 mg | ORAL_TABLET | Freq: Four times a day (QID) | ORAL | 0 refills | Status: DC

## 2021-04-27 MED ORDER — TRAZODONE HCL 100 MG PO TABS: ORAL_TABLET | ORAL | 3 refills | Status: DC

## 2021-04-27 NOTE — Progress Notes (Signed)
RFV: follow up for hiv disease  Patient ID: Nathan Wood, male   DOB: Sep 12, 1955, 66 y.o.   MRN: [id number]  HPI Nathan Wood is a 66yo M with HIV disease, on biktarvy  Muscular strain to back- keeps him for sleeping Needs refill on trazadone  Outpatient Encounter Medications as of 04/27/2021  Medication Sig   BIKTARVY 50-200-25 MG TABS tablet TAKE 1 TABLET BY MOUTH DAILY   fish oil-omega-3 fatty acids 1000 MG capsule Take 2 g by mouth daily.   traZODone (DESYREL) 100 MG tablet TAKE 1 TABLET(100 MG) BY MOUTH AT BEDTIME   vitamin B-12 (CYANOCOBALAMIN) 100 MCG tablet Take 100 mcg by mouth daily.   gabapentin (NEURONTIN) 300 MG capsule TAKE 2 CAPSULES(600 MG) BY MOUTH AT BEDTIME (Patient not taking: Reported on 04/27/2021)   rosuvastatin (CRESTOR) 10 MG tablet Take 1 tablet (10 mg total) by mouth daily. (Patient not taking: Reported on 04/27/2021)   No facility-administered encounter medications on file as of 04/27/2021.     Patient Active Problem List   Diagnosis Date Noted   Acute bronchitis 01/08/2013   Tooth abscess 02/07/2012   Depression, major, single episode, moderate ([place]) 07/01/2011    Class: Acute   Hypotension 04/21/2011   Dizziness 04/21/2011   HIV (human immunodeficiency virus infection) ([place]) 02/10/2011   Acute pancreatitis 02/10/2011   Abdominal pain, acute, epigastric 02/04/2011   Hyperproteinemia 02/04/2011   Proteinuria 02/04/2011   Lymphadenopathy 02/04/2011   Anemia 02/04/2011   Rash and nonspecific skin eruption 02/04/2011     Health Maintenance Due  Topic Date Due   Zoster Vaccines- Shingrix (1 of 2) Never done   COLONOSCOPY (Pts 45-71yrs Insurance coverage will need to be confirmed)  Never done   COVID-19 Vaccine (3 - Moderna risk series) 07/25/2019   INFLUENZA VACCINE  10/05/2020   Pneumonia Vaccine 3+ Years old (3 - PPSV23 if available, else PCV20) 02/08/2021     Review of Systems 12 point ros is negative other than what is mentioned in  hpi Physical Exam   BP 122/79   Pulse 60   Temp 98.3 F (36.8 C) (Oral)   Ht 6\' 1"  (1.854 m)   Wt 215 lb (97.5 kg)   SpO2 99%   BMI 28.37 kg/m   Physical Exam  Constitutional: He is oriented to person, place, and time. He appears well-developed and well-nourished. No distress.  HENT:  Mouth/Throat: Oropharynx is clear and moist. No oropharyngeal exudate.  Cardiovascular: Normal rate, regular rhythm and normal heart sounds. Exam reveals no gallop and no friction rub.  No murmur heard.  Pulmonary/Chest: Effort normal and breath sounds normal. No respiratory distress. He has no wheezes.  Abdominal: Soft. Bowel sounds are normal. He exhibits no distension. There is no tenderness.  Lymphadenopathy:  He has no cervical adenopathy.  Neurological: He is alert and oriented to person, place, and time.  Skin: Skin is warm and dry. No rash noted. No erythema.  Psychiatric: He has a normal mood and affect. His behavior is normal.   Lab Results  Component Value Date   CD4TCELL 57 09/29/2020   Lab Results  Component Value Date   CD4TABS 1,227 09/29/2020   CD4TABS 1,061 06/22/2020   CD4TABS 1,246 11/04/2019   Lab Results  Component Value Date   HIV1RNAQUANT Not Detected 09/29/2020   Lab Results  Component Value Date   HEPBSAB POSITIVE (A) 02/04/2011   Lab Results  Component Value Date   LABRPR NON-REACTIVE 09/29/2020    CBC Lab Results  Component Value Date   WBC 4.9 09/29/2020   RBC 3.90 (L) 09/29/2020   HGB 12.9 (L) 09/29/2020   HCT 38.0 (L) 09/29/2020   PLT 254 09/29/2020   MCV 97.4 09/29/2020   MCH 33.1 (H) 09/29/2020   MCHC 33.9 09/29/2020   RDW 11.7 09/29/2020   LYMPHSABS 2,303 09/29/2020   MONOABS 216 04/20/2016   EOSABS 196 09/29/2020    BMET Lab Results  Component Value Date   NA 140 09/29/2020   K 4.5 09/29/2020   CL 107 09/29/2020   CO2 29 09/29/2020   GLUCOSE 91 09/29/2020   BUN 18 09/29/2020   CREATININE 1.33 09/29/2020   CALCIUM 9.7 09/29/2020    GFRNONAA 62 11/04/2019   GFRAA 71 11/04/2019      Assessment and Plan Hiv disease= wil provide refills but also need to get labs to see that the is still undetectable  Long term medication management = will check cr  Musculoskeletal strain = will give robaxin  Refill trazodone

## 2021-04-29 LAB — RPR: RPR Ser Ql: NONREACTIVE

## 2021-04-29 LAB — COMPLETE METABOLIC PANEL WITH GFR
AG Ratio: 1.5 (calc) (ref 1.0–2.5)
ALT: 10 U/L (ref 9–46)
AST: 13 U/L (ref 10–35)
Albumin: 4.6 g/dL (ref 3.6–5.1)
Alkaline phosphatase (APISO): 66 U/L (ref 35–144)
BUN: 18 mg/dL (ref 7–25)
CO2: 30 mmol/L (ref 20–32)
Calcium: 10 mg/dL (ref 8.6–10.3)
Chloride: 105 mmol/L (ref 98–110)
Creat: 1.3 mg/dL (ref 0.70–1.35)
Globulin: 3 g/dL (calc) (ref 1.9–3.7)
Glucose, Bld: 86 mg/dL (ref 65–99)
Potassium: 4.5 mmol/L (ref 3.5–5.3)
Sodium: 141 mmol/L (ref 135–146)
Total Bilirubin: 0.4 mg/dL (ref 0.2–1.2)
Total Protein: 7.6 g/dL (ref 6.1–8.1)
eGFR: 61 mL/min/{1.73_m2} (ref >=60)

## 2021-04-29 LAB — CBC WITH DIFFERENTIAL/PLATELET
Absolute Monocytes: 220 cells/uL (ref 200–950)
Basophils Absolute: 40 cells/uL (ref 0–200)
Basophils Relative: 1.1 %
Eosinophils Absolute: 180 cells/uL (ref 15–500)
Eosinophils Relative: 5 %
HCT: 41 % (ref 38.5–50.0)
Hemoglobin: 14 g/dL (ref 13.2–17.1)
Lymphs Abs: 1710 cells/uL (ref 850–3900)
MCH: 33.1 pg — ABNORMAL HIGH (ref 27.0–33.0)
MCHC: 34.1 g/dL (ref 32.0–36.0)
MCV: 96.9 fL (ref 80.0–100.0)
MPV: 10.9 fL (ref 7.5–12.5)
Monocytes Relative: 6.1 %
Neutro Abs: 1451 cells/uL — ABNORMAL LOW (ref 1500–7800)
Neutrophils Relative %: 40.3 %
Platelets: 257 10*3/uL (ref 140–400)
RBC: 4.23 10*6/uL (ref 4.20–5.80)
RDW: 11.5 % (ref 11.0–15.0)
Total Lymphocyte: 47.5 %
WBC: 3.6 10*3/uL — ABNORMAL LOW (ref 3.8–10.8)

## 2021-04-29 LAB — T-HELPER CELLS (CD4) COUNT (NOT AT ARMC)
Absolute CD4: 961 cells/uL (ref 490–1740)
CD4 T Helper %: 56 % (ref 30–61)
Total lymphocyte count: 1730 cells/uL (ref 850–3900)

## 2021-04-29 LAB — HIV-1 RNA QUANT-NO REFLEX-BLD
HIV 1 RNA Quant: NOT DETECTED Copies/mL
HIV-1 RNA Quant, Log: NOT DETECTED Log cps/mL

## 2021-05-04 ENCOUNTER — Telehealth: Payer: Self-pay

## 2021-05-04 NOTE — Telephone Encounter (Signed)
Patient came in to drop off some forms for [facility], was requesting to talk to Dr. Baxter Flattery. I informed the patient she was not in the office this morning and would like a call from nurse only said he was having issues and pointed on the right side of his arm and leg. Best contact number [phone]

## 2021-05-04 NOTE — Telephone Encounter (Addendum)
Spoke with patient, he says he was prescribed a medication (robaxin) to help him with pain that he was having on his left side and hip. He says he started having pain in his right shoulder last night that was preventing him from sleeping. Patient does not have PCP and Dr. Drue Second does not have any availability this week, recommended urgent care for evaluation. Patient verbalized understanding and has no further questions.   [name] Ano, RN

## 2021-05-05 ENCOUNTER — Encounter: Payer: Self-pay | Admitting: Internal Medicine

## 2021-06-29 ENCOUNTER — Other Ambulatory Visit: Payer: Self-pay | Admitting: Internal Medicine

## 2021-07-01 ENCOUNTER — Other Ambulatory Visit: Payer: Self-pay

## 2021-07-01 DIAGNOSIS — T148XXA Other injury of unspecified body region, initial encounter: Secondary | ICD-10-CM

## 2021-07-01 NOTE — Telephone Encounter (Signed)
Okayed by Dr. Drue Second to refer patient to Sports Medicine. Referral has been ordered for the patient and patient informed. ? ?

## 2021-07-01 NOTE — Telephone Encounter (Signed)
Okay to refill? 

## 2021-07-25 ENCOUNTER — Other Ambulatory Visit: Payer: Self-pay | Admitting: Internal Medicine

## 2021-07-26 ENCOUNTER — Ambulatory Visit: Payer: Self-pay | Admitting: Internal Medicine

## 2021-08-03 ENCOUNTER — Ambulatory Visit: Payer: Self-pay | Admitting: Internal Medicine

## 2021-09-16 ENCOUNTER — Ambulatory Visit: Payer: Self-pay | Admitting: Internal Medicine

## 2021-09-23 ENCOUNTER — Ambulatory Visit: Payer: Self-pay | Admitting: Internal Medicine

## 2021-09-28 ENCOUNTER — Ambulatory Visit: Payer: Self-pay | Admitting: Internal Medicine

## 2021-10-30 ENCOUNTER — Other Ambulatory Visit: Payer: Self-pay | Admitting: Internal Medicine

## 2021-11-01 ENCOUNTER — Other Ambulatory Visit: Payer: Self-pay | Admitting: Internal Medicine

## 2021-11-01 MED ORDER — TRAZODONE HCL 100 MG PO TABS: ORAL_TABLET | ORAL | 3 refills | Status: DC

## 2021-11-01 NOTE — Telephone Encounter (Signed)
Request printed, placed in provider's box.   Christien Berthelot D Cruzito Standre, RN  

## 2021-11-15 ENCOUNTER — Telehealth: Payer: Self-pay

## 2021-11-15 ENCOUNTER — Ambulatory Visit: Payer: Self-pay | Admitting: Internal Medicine

## 2021-11-15 NOTE — Telephone Encounter (Signed)
Called patient to reschedule missed appointment. No answer. Voicemailbox full at mobile number; unable to leave message. Call could not be completed at home number. Will send MyChart message.  Wyvonne Lenz, RN

## 2021-11-16 ENCOUNTER — Other Ambulatory Visit: Payer: Self-pay

## 2021-11-16 ENCOUNTER — Ambulatory Visit: Payer: Self-pay

## 2021-11-16 ENCOUNTER — Encounter: Payer: Self-pay | Admitting: Internal Medicine

## 2021-11-16 ENCOUNTER — Ambulatory Visit (INDEPENDENT_AMBULATORY_CARE_PROVIDER_SITE_OTHER): Payer: Self-pay | Admitting: Internal Medicine

## 2021-11-16 ENCOUNTER — Other Ambulatory Visit (HOSPITAL_COMMUNITY): Payer: Self-pay

## 2021-11-16 VITALS — BP 96/61 | HR 73 | Temp 97.7°F | Wt 203.0 lb

## 2021-11-16 DIAGNOSIS — B20 Human immunodeficiency virus [HIV] disease: Secondary | ICD-10-CM

## 2021-11-16 DIAGNOSIS — Z122 Encounter for screening for malignant neoplasm of respiratory organs: Secondary | ICD-10-CM

## 2021-11-16 DIAGNOSIS — Z0000 Encounter for general adult medical examination without abnormal findings: Secondary | ICD-10-CM

## 2021-11-16 DIAGNOSIS — Z87891 Personal history of nicotine dependence: Secondary | ICD-10-CM

## 2021-11-16 MED ORDER — BIKTARVY 50-200-25 MG PO TABS: 1.0000 | ORAL_TABLET | Freq: Every day | ORAL | 11 refills | Status: DC

## 2021-11-16 NOTE — Progress Notes (Signed)
RFV: follow up for hiv disease  Patient ID: Nathan Wood, male   DOB: [birth year], 66 y.o.   MRN: [id number]  HPI  66yo M on biktarvy, well controlled, CD 4 count 961/VL<20 in feb 2023.has excellent adherence. He did have recent viral infection that needed to go to the ED and ruled out covid/flu. But now better. He was out ill 5-7 days to recover.  Outpatient Encounter Medications as of 11/16/2021  Medication Sig   bictegravir-emtricitabine-tenofovir AF (BIKTARVY) 50-200-25 MG TABS tablet Take 1 tablet by mouth daily.   methocarbamol (ROBAXIN) 500 MG tablet TAKE 1 TABLET(500 MG) BY MOUTH FOUR TIMES DAILY   rosuvastatin (CRESTOR) 10 MG tablet Take 1 tablet (10 mg total) by mouth daily.   traZODone (DESYREL) 100 MG tablet Take 1 tab at bedtime for sleep   fish oil-omega-3 fatty acids 1000 MG capsule Take 2 g by mouth daily. (Patient not taking: Reported on 11/16/2021)   vitamin B-12 (CYANOCOBALAMIN) 100 MCG tablet Take 100 mcg by mouth daily. (Patient not taking: Reported on 11/16/2021)   No facility-administered encounter medications on file as of 11/16/2021.    Smoking + 1/2PPD.  Patient Active Problem List   Diagnosis Date Noted   Acute bronchitis 01/08/2013   Tooth abscess 02/07/2012   Depression, major, single episode, moderate ([facility]) 07/01/2011    Class: Acute   Hypotension 04/21/2011   Dizziness 04/21/2011   HIV (human immunodeficiency virus infection) ([facility]) 02/10/2011   Acute pancreatitis 02/10/2011   Abdominal pain, acute, epigastric 02/04/2011   Hyperproteinemia 02/04/2011   Proteinuria 02/04/2011   Lymphadenopathy 02/04/2011   Anemia 02/04/2011   Rash and nonspecific skin eruption 02/04/2011     Health Maintenance Due  Topic Date Due   Zoster Vaccines- Shingrix (1 of 2) Never done   COLONOSCOPY (Pts 45-48yrs Insurance coverage will need to be confirmed)  Never done   COVID-19 Vaccine (3 - Moderna risk series) 07/25/2019   Pneumonia Vaccine 57+ Years old (3 - PPSV23  or PCV20) 02/08/2021   INFLUENZA VACCINE  10/05/2021     Review of Systems Review of Systems  Constitutional: Negative for fever, chills, diaphoresis, activity change, appetite change, fatigue and unexpected weight change.  HENT: Negative for congestion, sore throat, rhinorrhea, sneezing, trouble swallowing and sinus pressure.  Eyes: Negative for photophobia and visual disturbance.  Respiratory: Negative for cough, chest tightness, shortness of breath, wheezing and stridor.  Cardiovascular: Negative for chest pain, palpitations and leg swelling.  Gastrointestinal: Negative for nausea, vomiting, abdominal pain, diarrhea, constipation, blood in stool, abdominal distention and anal bleeding.  Genitourinary: Negative for dysuria, hematuria, flank pain and difficulty urinating.  Musculoskeletal: Negative for myalgias, back pain, joint swelling, arthralgias and gait problem.  Skin: Negative for color change, pallor, rash and wound.  Neurological: Negative for dizziness, tremors, weakness and light-headedness.  Hematological: Negative for adenopathy. Does not bruise/bleed easily.  Psychiatric/Behavioral: Negative for behavioral problems, confusion, sleep disturbance, dysphoric mood, decreased concentration and agitation.   Physical Exam   BP 96/61   Pulse 73   Temp 97.7 F (36.5 C) (Oral)   Wt 203 lb (92.1 kg)   SpO2 98%   BMI 26.78 kg/m   Physical Exam  Constitutional: He is oriented to person, place, and time. He appears well-developed and well-nourished. No distress.  HENT:  Mouth/Throat: Oropharynx is clear and moist. No oropharyngeal exudate.  Cardiovascular: Normal rate, regular rhythm and normal heart sounds. Exam reveals no gallop and no friction rub.  No murmur heard.  Pulmonary/Chest: Effort  normal and breath sounds normal. No respiratory distress. He has no wheezes.  Abdominal: Soft. Bowel sounds are normal. He exhibits no distension. There is no tenderness.  Lymphadenopathy:   He has no cervical adenopathy.  Neurological: He is alert and oriented to person, place, and time.  Skin: Skin is warm and dry. No rash noted. No erythema.  Psychiatric: He has a normal mood and affect. His behavior is normal.   Lab Results  Component Value Date   CD4TCELL 56 04/27/2021   Lab Results  Component Value Date   CD4TABS 1,227 09/29/2020   CD4TABS 1,061 06/22/2020   CD4TABS 1,246 11/04/2019   Lab Results  Component Value Date   HIV1RNAQUANT Not Detected 04/27/2021   Lab Results  Component Value Date   HEPBSAB POSITIVE (A) 02/04/2011   Lab Results  Component Value Date   LABRPR NON-REACTIVE 04/27/2021    CBC Lab Results  Component Value Date   WBC 3.6 (L) 04/27/2021   RBC 4.23 04/27/2021   HGB 14.0 04/27/2021   HCT 41.0 04/27/2021   PLT 257 04/27/2021   MCV 96.9 04/27/2021   MCH 33.1 (H) 04/27/2021   MCHC 34.1 04/27/2021   RDW 11.5 04/27/2021   LYMPHSABS 1,710 04/27/2021   MONOABS 216 04/20/2016   EOSABS 180 04/27/2021    BMET Lab Results  Component Value Date   NA 141 04/27/2021   K 4.5 04/27/2021   CL 105 04/27/2021   CO2 30 04/27/2021   GLUCOSE 86 04/27/2021   BUN 18 04/27/2021   CREATININE 1.30 04/27/2021   CALCIUM 10.0 04/27/2021   GFRNONAA 62 11/04/2019   GFRAA 71 11/04/2019      Assessment and Plan  Hiv disease = well controlled in last labs. Will recheck labs  Long term medication = will check cr  Health care maintenance = place ref for colonoscopy; pneumonia vaccine due April 2024;   He disclosed to his brother (who is also HIV +)   Lung cancer screening: Recommends annual low-dose CT scan screening for high-risk individuals -- ages 69 to 52 years with ?20 pack-year history and cumulative risk >5% over next 5 years; or lung cancer survivors with no incidence of .Marland KitchenMarland Kitchen

## 2021-11-17 LAB — T-HELPER CELL (CD4) - (RCID CLINIC ONLY)
CD4 % Helper T Cell: 55 % (ref 33–65)
CD4 T Cell Abs: 1029 /uL (ref 400–1790)

## 2021-11-18 LAB — HIV-1 RNA QUANT-NO REFLEX-BLD
HIV 1 RNA Quant: NOT DETECTED Copies/mL
HIV-1 RNA Quant, Log: NOT DETECTED Log cps/mL

## 2021-11-18 LAB — CBC WITH DIFFERENTIAL/PLATELET
Absolute Monocytes: 254 cells/uL (ref 200–950)
Basophils Absolute: 52 cells/uL (ref 0–200)
Basophils Relative: 1.2 %
Eosinophils Absolute: 211 cells/uL (ref 15–500)
Eosinophils Relative: 4.9 %
HCT: 41.2 % (ref 38.5–50.0)
Hemoglobin: 14 g/dL (ref 13.2–17.1)
Lymphs Abs: 2000 cells/uL (ref 850–3900)
MCH: 33.2 pg — ABNORMAL HIGH (ref 27.0–33.0)
MCHC: 34 g/dL (ref 32.0–36.0)
MCV: 97.6 fL (ref 80.0–100.0)
MPV: 11.5 fL (ref 7.5–12.5)
Monocytes Relative: 5.9 %
Neutro Abs: 1785 cells/uL (ref 1500–7800)
Neutrophils Relative %: 41.5 %
Platelets: 228 10*3/uL (ref 140–400)
RBC: 4.22 10*6/uL (ref 4.20–5.80)
RDW: 11.8 % (ref 11.0–15.0)
Total Lymphocyte: 46.5 %
WBC: 4.3 10*3/uL (ref 3.8–10.8)

## 2021-11-18 LAB — COMPLETE METABOLIC PANEL WITH GFR
AG Ratio: 1.4 (calc) (ref 1.0–2.5)
ALT: 14 U/L (ref 9–46)
AST: 16 U/L (ref 10–35)
Albumin: 4.5 g/dL (ref 3.6–5.1)
Alkaline phosphatase (APISO): 74 U/L (ref 35–144)
BUN/Creatinine Ratio: 14 (calc) (ref 6–22)
BUN: 20 mg/dL (ref 7–25)
CO2: 29 mmol/L (ref 20–32)
Calcium: 10.1 mg/dL (ref 8.6–10.3)
Chloride: 105 mmol/L (ref 98–110)
Creat: 1.42 mg/dL — ABNORMAL HIGH (ref 0.70–1.35)
Globulin: 3.2 g/dL (calc) (ref 1.9–3.7)
Glucose, Bld: 93 mg/dL (ref 65–99)
Potassium: 4.7 mmol/L (ref 3.5–5.3)
Sodium: 142 mmol/L (ref 135–146)
Total Bilirubin: 0.3 mg/dL (ref 0.2–1.2)
Total Protein: 7.7 g/dL (ref 6.1–8.1)
eGFR: 54 mL/min/{1.73_m2} — ABNORMAL LOW (ref >=60)

## 2021-11-18 LAB — RPR: RPR Ser Ql: NONREACTIVE

## 2021-11-23 ENCOUNTER — Encounter: Payer: Self-pay | Admitting: Internal Medicine

## 2021-12-10 ENCOUNTER — Inpatient Hospital Stay: Admission: RE | Admit: 2021-12-10 | Payer: Self-pay | Source: Ambulatory Visit

## 2021-12-16 ENCOUNTER — Inpatient Hospital Stay: Admission: RE | Admit: 2021-12-16 | Payer: Self-pay | Source: Ambulatory Visit

## 2022-02-24 ENCOUNTER — Telehealth: Payer: Self-pay | Admitting: Internal Medicine

## 2022-02-24 NOTE — Telephone Encounter (Signed)
Patient has no showed and canceled his appointments on 12-10-21 and 12-16-21  have called him to reschedule and no response for his CT Chest Lung Cancer Screening    closed referral

## 2022-03-24 ENCOUNTER — Ambulatory Visit: Payer: Self-pay | Admitting: Internal Medicine

## 2022-04-06 ENCOUNTER — Other Ambulatory Visit: Payer: Self-pay | Admitting: Internal Medicine

## 2022-04-14 ENCOUNTER — Ambulatory Visit: Payer: Self-pay | Admitting: Internal Medicine

## 2022-04-21 ENCOUNTER — Ambulatory Visit: Payer: Self-pay | Admitting: Internal Medicine

## 2022-05-09 ENCOUNTER — Other Ambulatory Visit: Payer: Self-pay

## 2022-05-09 MED ORDER — TRAZODONE HCL 100 MG PO TABS: ORAL_TABLET | ORAL | 3 refills | Status: DC

## 2022-05-16 ENCOUNTER — Ambulatory Visit: Payer: Self-pay | Admitting: Internal Medicine

## 2022-05-17 ENCOUNTER — Ambulatory Visit: Payer: Self-pay | Admitting: Internal Medicine

## 2022-06-14 ENCOUNTER — Other Ambulatory Visit: Payer: Self-pay

## 2022-06-14 ENCOUNTER — Ambulatory Visit: Payer: Self-pay | Admitting: Internal Medicine

## 2022-06-14 DIAGNOSIS — B20 Human immunodeficiency virus [HIV] disease: Secondary | ICD-10-CM

## 2022-06-14 DIAGNOSIS — Z113 Encounter for screening for infections with a predominantly sexual mode of transmission: Secondary | ICD-10-CM

## 2022-07-14 ENCOUNTER — Ambulatory Visit: Payer: Self-pay | Admitting: Internal Medicine

## 2022-08-26 ENCOUNTER — Other Ambulatory Visit: Payer: Self-pay

## 2022-08-26 DIAGNOSIS — Z113 Encounter for screening for infections with a predominantly sexual mode of transmission: Secondary | ICD-10-CM

## 2022-08-26 DIAGNOSIS — B20 Human immunodeficiency virus [HIV] disease: Secondary | ICD-10-CM

## 2022-08-26 LAB — CBC WITH DIFFERENTIAL/PLATELET
Absolute Monocytes: 252 cells/uL (ref 200–950)
MPV: 11 fL (ref 7.5–12.5)
Monocytes Relative: 5.6 %
Neutrophils Relative %: 48 %

## 2022-08-27 LAB — COMPLETE METABOLIC PANEL WITH GFR
ALT: 11 U/L (ref 9–46)
AST: 14 U/L (ref 10–35)
Creat: 1.28 mg/dL (ref 0.70–1.35)
Total Protein: 7.5 g/dL (ref 6.1–8.1)

## 2022-08-27 LAB — RPR: RPR Ser Ql: NONREACTIVE

## 2022-08-28 LAB — CBC WITH DIFFERENTIAL/PLATELET
Basophils Absolute: 41 cells/uL (ref 0–200)
Basophils Relative: 0.9 %
Eosinophils Absolute: 230 cells/uL (ref 15–500)
Eosinophils Relative: 5.1 %
HCT: 40.1 % (ref 38.5–50.0)
Hemoglobin: 13.3 g/dL (ref 13.2–17.1)
Lymphs Abs: 1818 cells/uL (ref 850–3900)
MCH: 32.5 pg (ref 27.0–33.0)
MCHC: 33.2 g/dL (ref 32.0–36.0)
MCV: 98 fL (ref 80.0–100.0)
Neutro Abs: 2160 cells/uL (ref 1500–7800)
Platelets: 233 10*3/uL (ref 140–400)
RBC: 4.09 10*6/uL — ABNORMAL LOW (ref 4.20–5.80)
RDW: 11.8 % (ref 11.0–15.0)
Total Lymphocyte: 40.4 %
WBC: 4.5 10*3/uL (ref 3.8–10.8)

## 2022-08-28 LAB — T-HELPER CELLS (CD4) COUNT (NOT AT ARMC)
Absolute CD4: 1082 cells/uL (ref 490–1740)
CD4 T Helper %: 55 % (ref 30–61)
Total lymphocyte count: 1968 cells/uL (ref 850–3900)

## 2022-08-28 LAB — COMPLETE METABOLIC PANEL WITH GFR
AG Ratio: 1.3 (calc) (ref 1.0–2.5)
Albumin: 4.3 g/dL (ref 3.6–5.1)
Alkaline phosphatase (APISO): 75 U/L (ref 35–144)
BUN: 19 mg/dL (ref 7–25)
CO2: 29 mmol/L (ref 20–32)
Calcium: 9.6 mg/dL (ref 8.6–10.3)
Chloride: 104 mmol/L (ref 98–110)
Globulin: 3.2 g/dL (calc) (ref 1.9–3.7)
Glucose, Bld: 85 mg/dL (ref 65–99)
Potassium: 4.3 mmol/L (ref 3.5–5.3)
Sodium: 139 mmol/L (ref 135–146)
Total Bilirubin: 0.3 mg/dL (ref 0.2–1.2)
eGFR: 62 mL/min/{1.73_m2} (ref >=60)

## 2022-08-28 LAB — HIV-1 RNA QUANT-NO REFLEX-BLD
HIV 1 RNA Quant: <20 Copies/mL — ABNORMAL HIGH
HIV-1 RNA Quant, Log: <1.3 Log cps/mL — ABNORMAL HIGH

## 2022-09-12 ENCOUNTER — Ambulatory Visit: Payer: Self-pay | Admitting: Internal Medicine

## 2022-10-08 ENCOUNTER — Other Ambulatory Visit: Payer: Self-pay | Admitting: Internal Medicine

## 2022-10-10 NOTE — Telephone Encounter (Signed)
Appt 11/18/22

## 2022-10-10 NOTE — Telephone Encounter (Signed)
Ok to refill 

## 2022-10-13 NOTE — Telephone Encounter (Signed)
Patient called and would like dose increased to 150 mg. Discussed primary care, he declines and would only like to see Dr. Drue Second.   [name] Ano, RN

## 2022-10-17 NOTE — Telephone Encounter (Signed)
Per Dr. Drue Second, okay to refill at current dose. Can discuss increasing dose at upcoming appointment.   [name] Ano, RN

## 2022-11-14 ENCOUNTER — Ambulatory Visit: Payer: Self-pay | Admitting: Internal Medicine

## 2022-11-15 ENCOUNTER — Ambulatory Visit: Payer: Self-pay | Admitting: Internal Medicine

## 2022-11-30 ENCOUNTER — Other Ambulatory Visit: Payer: Self-pay | Admitting: Internal Medicine

## 2022-12-22 ENCOUNTER — Ambulatory Visit: Payer: Self-pay | Admitting: Internal Medicine

## 2022-12-26 ENCOUNTER — Telehealth: Payer: Self-pay

## 2022-12-26 ENCOUNTER — Other Ambulatory Visit: Payer: Self-pay | Admitting: Internal Medicine

## 2022-12-26 DIAGNOSIS — B20 Human immunodeficiency virus [HIV] disease: Secondary | ICD-10-CM

## 2022-12-26 MED ORDER — BIKTARVY 50-200-25 MG PO TABS
1.0000 | ORAL_TABLET | Freq: Every day | ORAL | 0 refills | Status: DC
Start: 2022-12-26 — End: 2023-02-27

## 2022-12-26 NOTE — Telephone Encounter (Signed)
30 day supply sent, patient will need to keep 11/6 appointment for additional refills.   [name] Ano, RN

## 2022-12-26 NOTE — Telephone Encounter (Signed)
Patient called wanting to schedule an appt for a f/u. Explained that with our no show policy we would need to schedule with Pharm team due to his no shows and they will assist with his f/u. [place], and I explained that our pharmacists are a part of the same office with Dr. Drue Second and it is not a different location.   Offered appt this week, but denied. Patient has been scheduled for 01/11/2023 due to his car troubles. He did state he needs medication. Could you advise? I was unable to verify which prior to patient hanging up.

## 2022-12-26 NOTE — Addendum Note (Signed)
Addended by: Linna Hoff D on: 12/26/2022 12:58 PM   Modules accepted: Orders

## 2023-01-10 NOTE — Progress Notes (Unsigned)
HPI: Nathan Wood is a 67 y.o. male who presents to the [facility] pharmacy clinic for HIV follow-up.  Patient Active Problem List   Diagnosis Date Noted   Acute bronchitis 01/08/2013   Tooth abscess 02/07/2012   Depression, major, single episode, moderate ([facility]) 07/01/2011    Class: Acute   Hypotension 04/21/2011   Dizziness 04/21/2011   HIV (human immunodeficiency virus infection) ([facility]) 02/10/2011   Acute pancreatitis 02/10/2011   Abdominal pain, acute, epigastric 02/04/2011   Hyperproteinemia 02/04/2011   Proteinuria 02/04/2011   Lymphadenopathy 02/04/2011   Anemia 02/04/2011   Rash and nonspecific skin eruption 02/04/2011    Patient's Medications  New Prescriptions   No medications on file  Previous Medications   BICTEGRAVIR-EMTRICITABINE-TENOFOVIR AF (BIKTARVY) 50-200-25 MG TABS TABLET    Take 1 tablet by mouth daily.   FISH OIL-OMEGA-3 FATTY ACIDS 1000 MG CAPSULE    Take 2 g by mouth daily.   METHOCARBAMOL (ROBAXIN) 500 MG TABLET    TAKE 1 TABLET(500 MG) BY MOUTH FOUR TIMES DAILY   ROSUVASTATIN (CRESTOR) 10 MG TABLET    Take 1 tablet (10 mg total) by mouth daily.   TRAZODONE (DESYREL) 100 MG TABLET    TAKE 1 TABLET BY MOUTH AT BEDTIME FOR SLEEP   VITAMIN B-12 (CYANOCOBALAMIN) 100 MCG TABLET    Take 100 mcg by mouth daily.  Modified Medications   No medications on file  Discontinued Medications   No medications on file    Labs: Lab Results  Component Value Date   HIV1RNAQUANT <20 (H) 08/26/2022   HIV1RNAQUANT Not Detected 11/16/2021   HIV1RNAQUANT Not Detected 04/27/2021   CD4TABS 1,029 11/16/2021   CD4TABS 1,227 09/29/2020   CD4TABS 1,061 06/22/2020    RPR and STI Lab Results  Component Value Date   LABRPR NON-REACTIVE 08/26/2022   LABRPR NON-REACTIVE 11/16/2021   LABRPR NON-REACTIVE 04/27/2021   LABRPR NON-REACTIVE 09/29/2020   LABRPR NON-REACTIVE 06/22/2020    STI Results GC CT  06/22/2020 10:56 AM Negative  Negative   05/07/2019 10:46 AM Negative   Negative   12/13/2017 12:00 AM Negative  Negative   04/20/2016 12:00 AM Negative  Negative   01/05/2015 12:00 AM Negative  Negative   02/06/2011  6:17 PM  NEGATIVE     Hepatitis B Lab Results  Component Value Date   HEPBSAB POSITIVE (A) 02/04/2011   HEPBSAG NEGATIVE 02/04/2011   Hepatitis C No results found for: "HEPCAB", "HCVRNAPCRQN" Hepatitis A No results found for: "HAV" Lipids: Lab Results  Component Value Date   CHOL 176 06/22/2020   TRIG 123 06/22/2020   HDL 49 06/22/2020   CHOLHDL 3.6 06/22/2020   VLDL 31 (H) 04/20/2016   LDLCALC 105 (H) 06/22/2020    Current HIV Regimen: Biktarvy  Assessment: Caroll presents for follow-up for HIV. He is currently prescribed Biktarvy, which was last filled on 10/03/22. He was last seen by our clinic by Dr. Drue Second on 11/16/21, and he has missed several appointments since then. He reports *** (car troubles?). He lives ~1 hour away. Discussed with him the importance of adherence in the treatment of HIV, to both mediations and appointments. He ***. He *** STI testing today.  Immunizations: Due for COVID, influenza, HAV antibody, Menveo 1/2, PCV20. He *** these today. Recommended obtaining Shingrix at patient's preferred pharmacy.  Will schedule [facility] for follow up with Dr. Drue Second in 1 month. He is *** regarding getting back into care.  Plan: - HIV RNA,CBC and CD4 - Biktarvy x*** day supply  to *** (preferred pharmacy) - STI testing: RPR and oral/rectal/urine cytologies for gonorrhea/chlamydia - Immunizations: *** - Follow up with Dr. Drue Second ***  Lora Paula, PharmD PGY-2 Infectious Diseases Pharmacy Resident [facility] 01/10/2023 12:59 PM

## 2023-01-11 ENCOUNTER — Ambulatory Visit: Payer: Self-pay | Admitting: Pharmacist

## 2023-01-26 ENCOUNTER — Other Ambulatory Visit: Payer: Self-pay | Admitting: Internal Medicine

## 2023-01-26 DIAGNOSIS — B20 Human immunodeficiency virus [HIV] disease: Secondary | ICD-10-CM

## 2023-01-26 NOTE — Telephone Encounter (Signed)
Patient has missed 5 appointments, will need appointment for refills. Last seen 11/2021.  [name] Ano, RN

## 2023-02-27 ENCOUNTER — Telehealth: Payer: Self-pay

## 2023-02-27 DIAGNOSIS — B20 Human immunodeficiency virus [HIV] disease: Secondary | ICD-10-CM

## 2023-02-27 MED ORDER — BIKTARVY 50-200-25 MG PO TABS: 1.0000 | ORAL_TABLET | Freq: Every day | ORAL | 0 refills | Status: DC

## 2023-02-27 NOTE — Telephone Encounter (Signed)
Patient called requesting refill of Biktarvy, emphasized that only one refill will be sent and that he must keep upcoming appointment with pharmacy for additional refills. Patient verbalized understanding and has no further questions.   [name] Ano, RN

## 2023-03-17 ENCOUNTER — Ambulatory Visit: Payer: Self-pay | Admitting: Pharmacist

## 2023-03-20 NOTE — Progress Notes (Unsigned)
 HPI: Bolivar Koranda is a 68 y.o. male who presents to the [facility] pharmacy clinic for HIV follow-up.  Patient Active Problem List   Diagnosis Date Noted   Acute bronchitis 01/08/2013   Tooth abscess 02/07/2012   Depression, major, single episode, moderate ([facility]) 07/01/2011    Class: Acute   Hypotension 04/21/2011   Dizziness 04/21/2011   HIV (human immunodeficiency virus infection) ([facility]CC) 02/10/2011   Acute pancreatitis 02/10/2011   Abdominal pain, acute, epigastric 02/04/2011   Hyperproteinemia 02/04/2011   Proteinuria 02/04/2011   Lymphadenopathy 02/04/2011   Anemia 02/04/2011   Rash and nonspecific skin eruption 02/04/2011    Patient's Medications  New Prescriptions   No medications on file  Previous Medications   BICTEGRAVIR-EMTRICITABINE-TENOFOVIR AF (BIKTARVY ) 50-200-25 MG TABS TABLET    Take 1 tablet by mouth daily.   FISH OIL-OMEGA-3 FATTY ACIDS 1000 MG CAPSULE    Take 2 g by mouth daily.   METHOCARBAMOL  (ROBAXIN ) 500 MG TABLET    TAKE 1 TABLET(500 MG) BY MOUTH FOUR TIMES DAILY   ROSUVASTATIN  (CRESTOR ) 10 MG TABLET    Take 1 tablet (10 mg total) by mouth daily.   TRAZODONE  (DESYREL ) 100 MG TABLET    TAKE 1 TABLET BY MOUTH AT BEDTIME FOR SLEEP   VITAMIN B-12 (CYANOCOBALAMIN) 100 MCG TABLET    Take 100 mcg by mouth daily.  Modified Medications   No medications on file  Discontinued Medications   No medications on file    Labs: Lab Results  Component Value Date   HIV1RNAQUANT <20 (H) 08/26/2022   HIV1RNAQUANT Not Detected 11/16/2021   HIV1RNAQUANT Not Detected 04/27/2021   CD4TABS 1,029 11/16/2021   CD4TABS 1,227 09/29/2020   CD4TABS 1,061 06/22/2020    RPR and STI Lab Results  Component Value Date   LABRPR NON-REACTIVE 08/26/2022   LABRPR NON-REACTIVE 11/16/2021   LABRPR NON-REACTIVE 04/27/2021   LABRPR NON-REACTIVE 09/29/2020   LABRPR NON-REACTIVE 06/22/2020    STI Results GC CT  06/22/2020 10:56 AM Negative  Negative   05/07/2019 10:46 AM Negative   Negative   12/13/2017 12:00 AM Negative  Negative   04/20/2016 12:00 AM Negative  Negative   01/05/2015 12:00 AM Negative  Negative   02/06/2011  6:17 PM  NEGATIVE     Hepatitis B Lab Results  Component Value Date   HEPBSAB POSITIVE (A) 02/04/2011   HEPBSAG NEGATIVE 02/04/2011   Hepatitis C No results found for: HEPCAB, HCVRNAPCRQN Hepatitis A No results found for: HAV Lipids: Lab Results  Component Value Date   CHOL 176 06/22/2020   TRIG 123 06/22/2020   HDL 49 06/22/2020   CHOLHDL 3.6 06/22/2020   VLDL 31 (H) 04/20/2016   LDLCALC 105 (H) 06/22/2020    Current HIV Regimen: Biktarvy   Assessment: Maxximus presents today for pharmacist follow up for HIV. He was referred to pharmacy team secondary to multiple missed appointments. Last OV was with Dr. Luiz on 11/16/21, where he was undetectable on Biktarvy , with preserved CD4 count. His last RNA was undetectable via lab visit in June 2024, but Biktarvy  has only been filled 3 times since then. He reports ***. Will update CD4, HIV RNA with reflex to genotype, CMP, CBC w/ diff, Lipids.  Need to get him signed up with [facility].  Plan: - CD4, HIV RNA with reflex to genotype, CMP, CBC w/ diff, Lipids - Biktarvy  refills to [facility] - Follow up with Dr. Luiz on *** (1-2 months)  Con Laughter, PharmD PGY-2 Infectious Diseases Pharmacy Resident [facility]

## 2023-03-21 ENCOUNTER — Ambulatory Visit: Payer: Self-pay | Admitting: Pharmacist

## 2023-03-21 DIAGNOSIS — B20 Human immunodeficiency virus [HIV] disease: Secondary | ICD-10-CM

## 2023-03-21 DIAGNOSIS — Z113 Encounter for screening for infections with a predominantly sexual mode of transmission: Secondary | ICD-10-CM

## 2023-04-07 ENCOUNTER — Other Ambulatory Visit: Payer: Self-pay | Admitting: Internal Medicine

## 2023-04-07 DIAGNOSIS — B20 Human immunodeficiency virus [HIV] disease: Secondary | ICD-10-CM

## 2023-04-10 NOTE — Telephone Encounter (Signed)
Patient not seen since 11/2021. Has no-showed/cancelled 14 appointments since that time. Has been advised that appointment would be needed for additional refills.   [name] Ano, RN

## 2023-05-03 ENCOUNTER — Ambulatory Visit: Payer: Self-pay | Admitting: Pharmacist

## 2023-05-04 NOTE — Progress Notes (Incomplete)
 HPI: Nathan Wood is a 68 y.o. male who presents to the [facility] pharmacy clinic for HIV follow-up.  Patient Active Problem List   Diagnosis Date Noted   Acute bronchitis 01/08/2013   Tooth abscess 02/07/2012   Depression, major, single episode, moderate (HCC) 07/01/2011    Class: Acute   Hypotension 04/21/2011   Dizziness 04/21/2011   HIV (human immunodeficiency virus infection) (HCC) 02/10/2011   Acute pancreatitis 02/10/2011   Abdominal pain, acute, epigastric 02/04/2011   Hyperproteinemia 02/04/2011   Proteinuria 02/04/2011   Lymphadenopathy 02/04/2011   Anemia 02/04/2011   Rash and nonspecific skin eruption 02/04/2011    Patient's Medications  New Prescriptions   No medications on file  Previous Medications   BICTEGRAVIR-EMTRICITABINE-TENOFOVIR AF (BIKTARVY) 50-200-25 MG TABS TABLET    Take 1 tablet by mouth daily.   FISH OIL-OMEGA-3 FATTY ACIDS 1000 MG CAPSULE    Take 2 g by mouth daily.   METHOCARBAMOL (ROBAXIN) 500 MG TABLET    TAKE 1 TABLET(500 MG) BY MOUTH FOUR TIMES DAILY   ROSUVASTATIN (CRESTOR) 10 MG TABLET    Take 1 tablet (10 mg total) by mouth daily.   TRAZODONE (DESYREL) 100 MG TABLET    TAKE 1 TABLET BY MOUTH AT BEDTIME FOR SLEEP   VITAMIN B-12 (CYANOCOBALAMIN) 100 MCG TABLET    Take 100 mcg by mouth daily.  Modified Medications   No medications on file  Discontinued Medications   No medications on file    Labs: Lab Results  Component Value Date   HIV1RNAQUANT <20 (H) 08/26/2022   HIV1RNAQUANT Not Detected 11/16/2021   HIV1RNAQUANT Not Detected 04/27/2021   CD4TABS 1,029 11/16/2021   CD4TABS 1,227 09/29/2020   CD4TABS 1,061 06/22/2020    RPR and STI Lab Results  Component Value Date   LABRPR NON-REACTIVE 08/26/2022   LABRPR NON-REACTIVE 11/16/2021   LABRPR NON-REACTIVE 04/27/2021   LABRPR NON-REACTIVE 09/29/2020   LABRPR NON-REACTIVE 06/22/2020    STI Results GC CT  06/22/2020 10:56 AM Negative  Negative   05/07/2019 10:46 AM Negative   Negative   12/13/2017 12:00 AM Negative  Negative   04/20/2016 12:00 AM Negative  Negative   01/05/2015 12:00 AM Negative  Negative   02/06/2011  6:17 PM  NEGATIVE     Hepatitis B Lab Results  Component Value Date   HEPBSAB POSITIVE (A) 02/04/2011   HEPBSAG NEGATIVE 02/04/2011   Hepatitis C No results found for: "HEPCAB", "HCVRNAPCRQN" Hepatitis A No results found for: "HAV" Lipids: Lab Results  Component Value Date   CHOL 176 06/22/2020   TRIG 123 06/22/2020   HDL 49 06/22/2020   CHOLHDL 3.6 06/22/2020   VLDL 31 (H) 04/20/2016   LDLCALC 105 (H) 06/22/2020    Current HIV Regimen: Biktarvy  Assessment: Nathan Wood presents today for HIV follow up due to multiple missed appointments. Last seen by Dr. Drue Second 11/16/21 and was undetectable at this time. Was previously adherent to [facility] but now with a history of not showing up to appointments for over a year (no-showed/cancelled 14). Apparently did show for labs 08/26/22 in which he was undetectable, CD4 1082, and CMP/CBC within normal limits. Was advised in December 2024 that an appointment would be needed for additional refills.   Last fill 02/27/23 (spotty fill history)  IMZ: eligible for flu, covid, menveo, PCV20, shingles vaccinations  Plan: - HIV RNA, CD4 - CMP, CBC - RPR, oral/rectal/urine cytologies - HepA ab - Follow up scheduled with Marchelle Folks in 1 month  Stephenie Acres, PharmD PGY1  Pharmacy Resident 05/04/2023 2:44 PM

## 2023-05-05 ENCOUNTER — Ambulatory Visit: Payer: Self-pay | Admitting: Pharmacist

## 2023-05-05 DIAGNOSIS — Z113 Encounter for screening for infections with a predominantly sexual mode of transmission: Secondary | ICD-10-CM

## 2023-05-05 DIAGNOSIS — B20 Human immunodeficiency virus [HIV] disease: Secondary | ICD-10-CM

## 2023-05-05 DIAGNOSIS — Z23 Encounter for immunization: Secondary | ICD-10-CM

## 2023-05-05 DIAGNOSIS — Z0000 Encounter for general adult medical examination without abnormal findings: Secondary | ICD-10-CM

## 2023-05-10 NOTE — Progress Notes (Signed)
 HPI: Nathan Wood is a 68 y.o. male who presents to the [facility] pharmacy clinic for HIV follow-up.  Patient Active Problem List   Diagnosis Date Noted   Acute bronchitis 01/08/2013   Tooth abscess 02/07/2012   Depression, major, single episode, moderate (HCC) 07/01/2011    Class: Acute   Hypotension 04/21/2011   Dizziness 04/21/2011   HIV (human immunodeficiency virus infection) (HCC) 02/10/2011   Acute pancreatitis 02/10/2011   Abdominal pain, acute, epigastric 02/04/2011   Hyperproteinemia 02/04/2011   Proteinuria 02/04/2011   Lymphadenopathy 02/04/2011   Anemia 02/04/2011   Rash and nonspecific skin eruption 02/04/2011    Patient's Medications  New Prescriptions   No medications on file  Previous Medications   BICTEGRAVIR-EMTRICITABINE-TENOFOVIR AF (BIKTARVY) 50-200-25 MG TABS TABLET    Take 1 tablet by mouth daily.   FISH OIL-OMEGA-3 FATTY ACIDS 1000 MG CAPSULE    Take 2 g by mouth daily.   METHOCARBAMOL (ROBAXIN) 500 MG TABLET    TAKE 1 TABLET(500 MG) BY MOUTH FOUR TIMES DAILY   ROSUVASTATIN (CRESTOR) 10 MG TABLET    Take 1 tablet (10 mg total) by mouth daily.   TRAZODONE (DESYREL) 100 MG TABLET    TAKE 1 TABLET BY MOUTH AT BEDTIME FOR SLEEP   VITAMIN B-12 (CYANOCOBALAMIN) 100 MCG TABLET    Take 100 mcg by mouth daily.  Modified Medications   No medications on file  Discontinued Medications   No medications on file    Labs: Lab Results  Component Value Date   HIV1RNAQUANT <20 (H) 08/26/2022   HIV1RNAQUANT Not Detected 11/16/2021   HIV1RNAQUANT Not Detected 04/27/2021   CD4TABS 1,029 11/16/2021   CD4TABS 1,227 09/29/2020   CD4TABS 1,061 06/22/2020    RPR and STI Lab Results  Component Value Date   LABRPR NON-REACTIVE 08/26/2022   LABRPR NON-REACTIVE 11/16/2021   LABRPR NON-REACTIVE 04/27/2021   LABRPR NON-REACTIVE 09/29/2020   LABRPR NON-REACTIVE 06/22/2020    STI Results GC CT  06/22/2020 10:56 AM Negative  Negative   05/07/2019 10:46 AM Negative   Negative   12/13/2017 12:00 AM Negative  Negative   04/20/2016 12:00 AM Negative  Negative   01/05/2015 12:00 AM Negative  Negative   02/06/2011  6:17 PM  NEGATIVE     Hepatitis B Lab Results  Component Value Date   HEPBSAB POSITIVE (A) 02/04/2011   HEPBSAG NEGATIVE 02/04/2011   Hepatitis C No results found for: "HEPCAB", "HCVRNAPCRQN" Hepatitis A No results found for: "HAV" Lipids: Lab Results  Component Value Date   CHOL 176 06/22/2020   TRIG 123 06/22/2020   HDL 49 06/22/2020   CHOLHDL 3.6 06/22/2020   VLDL 31 (H) 04/20/2016   LDLCALC 105 (H) 06/22/2020    Current HIV Regimen: Biktarvy  Assessment: Nathan Wood is here today to follow up for his HIV infection. He has missed multiple appointments and was last seen by Dr. Drue Second on 11/16/21 where he had an undetectable HIV viral load on Biktarvy. Refills have been sporadic in the last year (03/08/22; 04/07/22; 07/07/22; 08/04/22; 09/08/22; 10/03/22; 12/26/22; 02/27/23).   He states that [place] away and it is hard to get here for appointments. He also had car trouble, but that is resolved now. He does not wish to establish care somewhere else and wishes to continue seeing Dr. Drue Second. He says that he has had enough Biktarvy until a few weeks ago. He then found a few in his car when he was cleaning it out  and took it every other day to make it last. Explained the danger with this and to never space out his doses. Asked him to just quit taking it cold [place] vs sporadically taking or he will risk development of resistance. He understands and will not take it that way again. I am unsure how he has taken it every day without interruption with his refill history.  He is asking for a trazodone refill. He is not taking any other medications. Will update his lab work today including resistance testing since he was taking his Biktarvy sporadically. He states that he can make it next month to see Dr. Drue Second. Will send in trazodone #30 x  1 and then let Dr. Drue Second decide on future refills. Will also refill his Biktarvy. He met with Deanna today to renew his SPAP. He has not been sexually active lately and declines STI testing. All questions and concerns addressed.  Plan: - HIV RNA with reflex, CD4, CBC with diff, CMP, lipid panel, Hepatitis A antibody, and RPR today - Refill Biktarvy #30 with 2 refills - Refill trazodone x 1 - Follow up with Dr. Drue Second on 06/15/23  Shade Rivenbark L. Soumya Colson, PharmD, BCIDP, AAHIVP, CPP Clinical Pharmacist Practitioner Infectious Diseases Clinical Pharmacist [facility] 05/10/2023, 1:39 PM

## 2023-05-15 ENCOUNTER — Other Ambulatory Visit: Payer: Self-pay

## 2023-05-15 ENCOUNTER — Ambulatory Visit: Payer: Self-pay | Admitting: Pharmacist

## 2023-05-15 DIAGNOSIS — Z113 Encounter for screening for infections with a predominantly sexual mode of transmission: Secondary | ICD-10-CM

## 2023-05-15 DIAGNOSIS — Z79899 Other long term (current) drug therapy: Secondary | ICD-10-CM

## 2023-05-15 DIAGNOSIS — Z0000 Encounter for general adult medical examination without abnormal findings: Secondary | ICD-10-CM

## 2023-05-15 DIAGNOSIS — B20 Human immunodeficiency virus [HIV] disease: Secondary | ICD-10-CM

## 2023-05-15 MED ORDER — TRAZODONE HCL 100 MG PO TABS: ORAL_TABLET | ORAL | 0 refills | Status: DC

## 2023-05-15 MED ORDER — BIKTARVY 50-200-25 MG PO TABS: 1.0000 | ORAL_TABLET | Freq: Every day | ORAL | 2 refills | Status: DC

## 2023-05-18 LAB — HEPATITIS A ANTIBODY, TOTAL: Hepatitis A AB,Total: REACTIVE — AB

## 2023-05-18 LAB — COMPREHENSIVE METABOLIC PANEL
AG Ratio: 1.6 (calc) (ref 1.0–2.5)
ALT: 10 U/L (ref 9–46)
AST: 14 U/L (ref 10–35)
Albumin: 4.8 g/dL (ref 3.6–5.1)
Alkaline phosphatase (APISO): 78 U/L (ref 35–144)
BUN: 25 mg/dL (ref 7–25)
CO2: 26 mmol/L (ref 20–32)
Calcium: 9.9 mg/dL (ref 8.6–10.3)
Chloride: 104 mmol/L (ref 98–110)
Creat: 1.3 mg/dL (ref 0.70–1.35)
Globulin: 3 g/dL (ref 1.9–3.7)
Glucose, Bld: 72 mg/dL (ref 65–99)
Potassium: 4.5 mmol/L (ref 3.5–5.3)
Sodium: 140 mmol/L (ref 135–146)
Total Bilirubin: 0.4 mg/dL (ref 0.2–1.2)
Total Protein: 7.8 g/dL (ref 6.1–8.1)

## 2023-05-18 LAB — CBC WITH DIFFERENTIAL/PLATELET
Absolute Lymphocytes: 1849 {cells}/uL (ref 850–3900)
Absolute Monocytes: 211 {cells}/uL (ref 200–950)
Basophils Absolute: 30 {cells}/uL (ref 0–200)
Basophils Relative: 0.7 %
Eosinophils Absolute: 120 {cells}/uL (ref 15–500)
Eosinophils Relative: 2.8 %
HCT: 43.5 % (ref 38.5–50.0)
Hemoglobin: 14.6 g/dL (ref 13.2–17.1)
MCH: 33.2 pg — ABNORMAL HIGH (ref 27.0–33.0)
MCHC: 33.6 g/dL (ref 32.0–36.0)
MCV: 98.9 fL (ref 80.0–100.0)
MPV: 11.1 fL (ref 7.5–12.5)
Monocytes Relative: 4.9 %
Neutro Abs: 2090 {cells}/uL (ref 1500–7800)
Neutrophils Relative %: 48.6 %
Platelets: 246 10*3/uL (ref 140–400)
RBC: 4.4 10*6/uL (ref 4.20–5.80)
RDW: 11.3 % (ref 11.0–15.0)
Total Lymphocyte: 43 %
WBC: 4.3 10*3/uL (ref 3.8–10.8)

## 2023-05-18 LAB — HIV RNA, RTPCR W/R GT (RTI, PI,INT)
HIV 1 RNA Quant: NOT DETECTED {copies}/mL
HIV-1 RNA Quant, Log: NOT DETECTED {Log_copies}/mL

## 2023-05-18 LAB — RPR: RPR Ser Ql: NONREACTIVE

## 2023-05-18 LAB — LIPID PANEL
Cholesterol: 213 mg/dL — ABNORMAL HIGH (ref <200)
HDL: 55 mg/dL (ref >=40)
LDL Cholesterol (Calc): 135 mg/dL — ABNORMAL HIGH
Non-HDL Cholesterol (Calc): 158 mg/dL — ABNORMAL HIGH (ref <130)
Total CHOL/HDL Ratio: 3.9 (calc) (ref <5.0)
Triglycerides: 120 mg/dL (ref <150)

## 2023-06-15 ENCOUNTER — Other Ambulatory Visit: Payer: Self-pay | Admitting: Pharmacist

## 2023-06-15 ENCOUNTER — Ambulatory Visit: Payer: Self-pay | Admitting: Internal Medicine

## 2023-07-17 NOTE — Progress Notes (Signed)
 The ASCVD Risk score (Arnett DK, et al., 2019) failed to calculate for the following reasons:   The systolic blood pressure is missing  Arlon Bergamo, BSN, Charity fundraiser

## 2023-07-18 ENCOUNTER — Other Ambulatory Visit (HOSPITAL_COMMUNITY): Payer: Self-pay

## 2023-07-27 ENCOUNTER — Ambulatory Visit: Payer: Self-pay | Admitting: Internal Medicine

## 2023-08-11 ENCOUNTER — Other Ambulatory Visit: Payer: Self-pay | Admitting: Pharmacist

## 2023-08-11 DIAGNOSIS — B20 Human immunodeficiency virus [HIV] disease: Secondary | ICD-10-CM

## 2023-09-22 ENCOUNTER — Ambulatory Visit: Payer: Self-pay | Admitting: Internal Medicine

## 2023-10-05 ENCOUNTER — Ambulatory Visit: Payer: Self-pay | Admitting: Internal Medicine

## 2023-11-08 ENCOUNTER — Ambulatory Visit: Payer: Self-pay | Admitting: Internal Medicine

## 2023-11-08 ENCOUNTER — Ambulatory Visit: Payer: Self-pay

## 2023-11-14 NOTE — ED Provider Notes (Signed)
 EMERGENCY DEPARTMENT PROVIDER NOTE [name]   Patient Name: Nathan Wood Date of Birth:  [birth year] MRN: [id number]  History of Present Illness  HPI  Chief Complaint  Patient presents with  . Blood in Urine     History provided by:  Patient Patient is a 68 year old male who presents from home for the evaluation of painless hematuria.  Patient with past medical history significant for HIV on Biktarvy  with recent undetectable CD4 levels.  Patient reports initially started with left flank pain approximately 1 month ago which has since resolved.  Patient reports over the past 2 weeks he has been experiencing intermittent painless hematuria described as fruit punch color in nature.  Patient reports worsening and darker hematuria starting this morning.  Patient does reports some slight bladder pain although denies any dysuria.  Patient does reports some increased urinary frequency.  Patient denies any nausea or vomiting.  Patient denies any constipation or diarrhea.  Patient denies any fevers or chills.  Patient does describe some hesitancy although denies any difficulty emptying his bladder.  Patient reports no previous history of hematuria and reports has never seen a Insurance underwriter.  Patient reports he is not on any blood thinners.  Patient denies any recent unprotected sex or penile discharge.  Patient denies any penile or testicular pain.    Medical and Social History  Patient History Medical History[1] Surgical History[2] Family History[3] Social History[4]    Review of Systems  Review of Systems Review of Systems  Respiratory:  Negative for cough and shortness of breath.   Gastrointestinal:  Negative for abdominal pain, diarrhea, nausea and vomiting.  Genitourinary:  Positive for hematuria and urgency. Negative for penile discharge and penile swelling.  Musculoskeletal:  Negative for back pain and myalgias.  All other systems reviewed and are  negative.    Physical Exam  Physical Exam ED Triage Vitals [11/14/23 1332]  Temperature Heart Rate Resp BP SpO2  36.6 C (97.8 F) 67 17 (!) 132/77 99 %    Temp Source Heart Rate Source Patient Position BP Location FiO2 (%)  Oral Monitor -- Right arm --   Physical Exam Vitals and nursing note reviewed.  Constitutional:      General: He is awake. He is not in acute distress.    Appearance: Normal appearance. He is well-developed and normal weight. He is not ill-appearing.  HENT:     Head: Normocephalic and atraumatic.     Mouth/Throat:     Mouth: Mucous membranes are moist. No lacerations.  Cardiovascular:     Rate and Rhythm: Normal rate and regular rhythm.     Heart sounds: Normal heart sounds, S1 normal and S2 normal. No murmur heard. Pulmonary:     Breath sounds: Normal breath sounds. No decreased breath sounds, wheezing, rhonchi or rales.  Abdominal:     General: Abdomen is flat. Bowel sounds are normal.     Palpations: Abdomen is soft.     Tenderness: There is no abdominal tenderness. There is no right CVA tenderness, left CVA tenderness, guarding or rebound.     Comments: Negative CVA tenderness, non-peritoneal abdomen.  Genitourinary:    Penis: Normal and circumcised. No tenderness, discharge, swelling or lesions.      Testes: Normal. Cremasteric reflex is present.     Comments: Unremarkable GU exam, no penile discharge, no blood at urethral meatus, unremarkable testicular exam, exam chaperoned by Clint RN. Musculoskeletal:     Cervical back: Normal range of motion and neck supple.  No rigidity or crepitus. No pain with movement, spinous process tenderness or muscular tenderness.  Neurological:     General: No focal deficit present.     Mental Status: He is alert and oriented to person, place, and time. Mental status is at baseline.     Sensory: Sensation is intact.     Motor: Motor function is intact.     Gait: Gait is intact.       ED Course and Medical Decision  Making   Medical Decision Making Nontoxic-appearing, vital signs stable, afebrile, non-peritoneal abdomen, in summary patient is a 68 year old male who presents for the evaluation of 2 week history of intermittent painless hematuria, patient reports worsening and darker hematuria starting this morning, patient reports some mild bladder discomfort although denies any difficulty emptying his bladder, patient denies any dysuria, patient denies any fevers or chills, patient describes no previous history of hematuria, patient reports only past medical history is well controlled HIV compliant with antiviral therapy, workup initiated, patient presents with dark hematuria without gross hematuria, urinalysis reviewed and overall unimpressive for infection, no leukocytosis, mild anemia, no thrombocytopenia, kidney function 1.43, CT Abdomen/Pelvis imaging reviewed, no evidence of obstructive uropathy, no hydronephrosis, finding suggestive of large 1.7 cm bladder stone as well as irregular bladder wall thickening with indeterminate osseous lesions highly suspicious for malignancy, patient was notified of these abnormal CT findings at bedside, case was reviewed with Urology recommending outpatient follow-up, referral order placed, advised increased fluid hydration at home, advised close outpatient Urology follow-up for repeat evaluation and further outpatient workup as clinically indicated including likely cystoscopy planning, advised strict and immediate return precautions for any new or worsening symptoms including any worsening pain, fever, or acute urinary retention return precautions, patient agrees with plan, all questions answered.  Problems Addressed: Bladder calculus: acute illness or injury Bladder wall thickening: acute illness or injury Painless hematuria: acute illness or injury  Amount and/or Complexity of Data Reviewed Labs: ordered. Decision-making details documented in ED Course.    Details: Labs  personally reviewed and interpreted by myself. Radiology: ordered and independent interpretation performed. Decision-making details documented in ED Course.    Details: CT Abdomen/Pelvis imaging suggestive of 1.7 cm bladder stone per my personal interpretation. Discussion of management or test interpretation with external provider(s): Dr. Excell (Urology).     ED Course as of 11/14/23 1603  Lonni PARAS [facility] Documentation  Tue Nov 14, 2023  1403 WBC, automated: 4.32  1403 Hemoglobin(!): 13.4  1403 Platelets: 273  1419 Leukocytes, urine: Negative  1419 Nitrite, urine: Negative  1419 Blood, urine(!): 3+  1419 RBC, urine(!): >100  1419 Calcium  Oxalate Crystals, urine(!): Few  1434 Creatinine(!): 1.43  1514 CT Abdomen Pelvis Without Contrast IMPRESSION No acute intra-abdominal inflammatory or obstructive process is seen. There is a large calculus in the urinary bladder lumen. There is irregular mural thickening of the urinary bladder. Consider cystoscopy to exclude underlying malignancy. There are indeterminate osseous lesions.  1526 Reviewed with Dr. Excell (Urology), recommends calling office for outpatient Urology follow-up.      Clinical Impressions as of 11/14/23 1603  Painless hematuria  Bladder calculus  Bladder wall thickening    ED Disposition  Discharge   ED Discharge Medications   ED Prescriptions   None    Only medications prescribed or modified are during this ED encounter are included in this list.       [1] Past Medical History: Diagnosis Date  . HIV (human immunodeficiency virus infection) (CMS/HCC)   [  2] No past surgical history on file. [3] No family history on file. [4] Social History Tobacco Use  . Smoking status: Every Day    Current packs/day: 0.50    Average packs/day: 0.5 packs/day for 58.0 years (29.0 ttl pk-yrs)    Types: Cigarettes    Start date: 11/13/1965  . Smokeless tobacco: Never  Substance Use Topics  . Alcohol use:  Not Currently  . Drug use: Never   Lonni JINNY Liter, DO 11/14/23 1603

## 2023-11-14 NOTE — ED Triage Notes (Signed)
 Patient presents to ED with complaints of urine in blood for about two weeks. Does endorse pain when urinating but doesn't call it burning but more pressure.

## 2023-11-15 ENCOUNTER — Telehealth: Payer: Self-pay | Admitting: Internal Medicine

## 2023-11-15 NOTE — Telephone Encounter (Signed)
 Nathan Wood requesting to speak with Dr. Luiz or a nurse. Pt stated he recently went to the hospital due to pain when passing urine. He was treated for a kidney stone and was told he may have stomach cancer. Pt requested an urgent call to [phone].

## 2023-11-15 NOTE — Telephone Encounter (Signed)
 Attempted to call patient back regarding message. Voicemail full. Nathan Wood Code, RMA

## 2023-12-28 ENCOUNTER — Ambulatory Visit: Payer: Self-pay | Admitting: Internal Medicine

## 2023-12-28 ENCOUNTER — Ambulatory Visit: Payer: Self-pay

## 2024-01-04 ENCOUNTER — Ambulatory Visit: Payer: Self-pay | Admitting: Internal Medicine

## 2024-01-04 ENCOUNTER — Ambulatory Visit: Payer: Self-pay

## 2024-01-15 ENCOUNTER — Other Ambulatory Visit: Payer: Self-pay | Admitting: Internal Medicine

## 2024-01-15 ENCOUNTER — Telehealth: Payer: Self-pay

## 2024-01-15 DIAGNOSIS — B20 Human immunodeficiency virus [HIV] disease: Secondary | ICD-10-CM

## 2024-01-15 NOTE — Telephone Encounter (Signed)
 Nathan Wood is on the scheduling list and due for an appt with Dr. Luiz. I attempted to make contact but VM full and no recent MyChart activity.

## 2024-01-15 NOTE — Telephone Encounter (Signed)
 Hey can someone please get him scheduled for follow up?

## 2024-01-26 ENCOUNTER — Ambulatory Visit

## 2024-01-26 ENCOUNTER — Ambulatory Visit: Admitting: Internal Medicine

## 2024-02-09 ENCOUNTER — Ambulatory Visit: Admitting: Internal Medicine

## 2024-02-14 ENCOUNTER — Other Ambulatory Visit: Payer: Self-pay | Admitting: Internal Medicine

## 2024-02-14 DIAGNOSIS — B20 Human immunodeficiency virus [HIV] disease: Secondary | ICD-10-CM

## 2024-02-14 NOTE — Telephone Encounter (Signed)
 Appt 11/12

## 2024-02-16 ENCOUNTER — Telehealth: Payer: Self-pay

## 2024-02-16 ENCOUNTER — Ambulatory Visit: Admitting: Internal Medicine

## 2024-02-16 ENCOUNTER — Ambulatory Visit

## 2024-02-16 NOTE — Telephone Encounter (Signed)
 Called patient to reschedule missed appointment, no answer and voice mail was full.

## 2024-03-13 ENCOUNTER — Ambulatory Visit

## 2024-03-13 ENCOUNTER — Ambulatory Visit: Admitting: Internal Medicine

## 2024-03-18 ENCOUNTER — Telehealth: Payer: Self-pay

## 2024-03-18 NOTE — Telephone Encounter (Signed)
 Patient left voicemail stating he needed all of his records to be sent to [facility].   Called Gustavus back, he states he cannot complete the ROI form on the computer and that he will fill it out when he comes in for his appointment on 1/15. Appointment note updated to provide patient with ROI at check-in.  Jose Corvin, BSN, RN

## 2024-03-21 ENCOUNTER — Ambulatory Visit

## 2024-03-21 ENCOUNTER — Ambulatory Visit: Admitting: Internal Medicine

## 2024-03-28 ENCOUNTER — Other Ambulatory Visit: Payer: Self-pay

## 2024-03-28 DIAGNOSIS — B20 Human immunodeficiency virus [HIV] disease: Secondary | ICD-10-CM

## 2024-03-28 DIAGNOSIS — Z113 Encounter for screening for infections with a predominantly sexual mode of transmission: Secondary | ICD-10-CM

## 2024-03-29 ENCOUNTER — Telehealth: Payer: Self-pay

## 2024-03-29 ENCOUNTER — Ambulatory Visit

## 2024-03-29 ENCOUNTER — Ambulatory Visit: Admitting: Internal Medicine

## 2024-03-29 ENCOUNTER — Other Ambulatory Visit: Payer: Self-pay

## 2024-03-29 DIAGNOSIS — Z113 Encounter for screening for infections with a predominantly sexual mode of transmission: Secondary | ICD-10-CM

## 2024-03-29 DIAGNOSIS — B20 Human immunodeficiency virus [HIV] disease: Secondary | ICD-10-CM

## 2024-03-29 MED ORDER — BIKTARVY 50-200-25 MG PO TABS: 1.0000 | ORAL_TABLET | Freq: Every day | ORAL | 0 refills | Status: AC

## 2024-03-29 MED ORDER — TRAZODONE HCL 100 MG PO TABS: ORAL_TABLET | ORAL | 0 refills | Status: AC

## 2024-03-29 NOTE — Telephone Encounter (Signed)
 Patient came in for lab work and request to speak with Dr. Junior nurse- spoke with pt who asked for refill on Trazodone . Recently dx with bladder cancer and is having difficulty sleeping. Refills sent to [facility] pharmacy per his request.  Patient also asking for recommendations to gain weight. Has lost 20 lbs. Advised he contact Dr. Goetzi and his PCP for this. Will forward message to Dr. Luiz as well to discuss concern at his appt.  Lorenda CHRISTELLA Code, RMA

## 2024-04-02 LAB — CBC WITH DIFFERENTIAL/PLATELET
Absolute Lymphocytes: 2165 {cells}/uL (ref 850–3900)
Absolute Monocytes: 350 {cells}/uL (ref 200–950)
Basophils Absolute: 53 {cells}/uL (ref 0–200)
Basophils Relative: 0.8 %
Eosinophils Absolute: 139 {cells}/uL (ref 15–500)
Eosinophils Relative: 2.1 %
HCT: 36.6 % — ABNORMAL LOW (ref 39.4–51.1)
Hemoglobin: 11.8 g/dL — ABNORMAL LOW (ref 13.2–17.1)
MCH: 32.2 pg (ref 27.0–33.0)
MCHC: 32.2 g/dL (ref 31.6–35.4)
MCV: 100 fL (ref 81.4–101.7)
MPV: 9.8 fL (ref 7.5–12.5)
Monocytes Relative: 5.3 %
Neutro Abs: 3894 {cells}/uL (ref 1500–7800)
Neutrophils Relative %: 59 %
Platelets: 418 10*3/uL — ABNORMAL HIGH (ref 140–400)
RBC: 3.66 Million/uL — ABNORMAL LOW (ref 4.20–5.80)
RDW: 11.4 % (ref 11.0–15.0)
Total Lymphocyte: 32.8 %
WBC: 6.6 10*3/uL (ref 3.8–10.8)

## 2024-04-02 LAB — COMPLETE METABOLIC PANEL WITHOUT GFR
AG Ratio: 1.3 (calc) (ref 1.0–2.5)
ALT: 18 U/L (ref 9–46)
AST: 17 U/L (ref 10–35)
Albumin: 4.3 g/dL (ref 3.6–5.1)
Alkaline phosphatase (APISO): 81 U/L (ref 35–144)
BUN: 25 mg/dL (ref 7–25)
CO2: 28 mmol/L (ref 20–32)
Calcium: 9.4 mg/dL (ref 8.6–10.3)
Chloride: 104 mmol/L (ref 98–110)
Creat: 1.32 mg/dL (ref 0.70–1.35)
Globulin: 3.2 g/dL (ref 1.9–3.7)
Glucose, Bld: 52 mg/dL — ABNORMAL LOW (ref 65–99)
Potassium: 4.8 mmol/L (ref 3.5–5.3)
Sodium: 142 mmol/L (ref 135–146)
Total Bilirubin: 0.3 mg/dL (ref 0.2–1.2)
Total Protein: 7.5 g/dL (ref 6.1–8.1)

## 2024-04-02 LAB — T-HELPER CELLS (CD4) COUNT (NOT AT ARMC)
Absolute CD4: 1345 {cells}/uL (ref 490–1740)
CD4 T Helper %: 56 % (ref 30–61)
Total lymphocyte count: 2384 {cells}/uL (ref 850–3900)

## 2024-04-02 LAB — HIV-1 RNA QUANT-NO REFLEX-BLD
HIV 1 RNA Quant: NOT DETECTED {copies}/mL
HIV-1 RNA Quant, Log: NOT DETECTED {Log_copies}/mL

## 2024-04-02 LAB — SYPHILIS: RPR W/REFLEX TO RPR TITER AND TREPONEMAL ANTIBODIES, TRADITIONAL SCREENING AND DIAGNOSIS ALGORITHM: RPR Ser Ql: NONREACTIVE

## 2024-04-18 ENCOUNTER — Ambulatory Visit

## 2024-04-18 ENCOUNTER — Ambulatory Visit: Admitting: Internal Medicine
